# Patient Record
Sex: Female | Born: 1970 | Race: White | Hispanic: No | Marital: Married | Smoking: Never smoker
Health system: Southern US, Community
[De-identification: ages and names within clinical notes are randomized; demographics above are authoritative.]

## PROBLEM LIST (undated history)

## (undated) ENCOUNTER — Ambulatory Visit: Admission: EM | Payer: BC Managed Care – PPO | Source: Home / Self Care

## (undated) DIAGNOSIS — L598 Other specified disorders of the skin and subcutaneous tissue related to radiation: Secondary | ICD-10-CM

## (undated) DIAGNOSIS — R0609 Other forms of dyspnea: Secondary | ICD-10-CM

## (undated) DIAGNOSIS — M2550 Pain in unspecified joint: Secondary | ICD-10-CM

## (undated) DIAGNOSIS — E559 Vitamin D deficiency, unspecified: Secondary | ICD-10-CM

## (undated) DIAGNOSIS — E349 Endocrine disorder, unspecified: Secondary | ICD-10-CM

## (undated) DIAGNOSIS — F329 Major depressive disorder, single episode, unspecified: Secondary | ICD-10-CM

## (undated) DIAGNOSIS — F32A Depression, unspecified: Secondary | ICD-10-CM

## (undated) DIAGNOSIS — C539 Malignant neoplasm of cervix uteri, unspecified: Secondary | ICD-10-CM

## (undated) DIAGNOSIS — E119 Type 2 diabetes mellitus without complications: Secondary | ICD-10-CM

## (undated) DIAGNOSIS — F419 Anxiety disorder, unspecified: Secondary | ICD-10-CM

## (undated) DIAGNOSIS — I10 Essential (primary) hypertension: Secondary | ICD-10-CM

## (undated) DIAGNOSIS — R768 Other specified abnormal immunological findings in serum: Secondary | ICD-10-CM

## (undated) DIAGNOSIS — E039 Hypothyroidism, unspecified: Secondary | ICD-10-CM

## (undated) DIAGNOSIS — N189 Chronic kidney disease, unspecified: Secondary | ICD-10-CM

## (undated) DIAGNOSIS — I82409 Acute embolism and thrombosis of unspecified deep veins of unspecified lower extremity: Secondary | ICD-10-CM

## (undated) HISTORY — PX: OTHER SURGICAL HISTORY: SHX169

## (undated) HISTORY — DX: Acute embolism and thrombosis of unspecified deep veins of unspecified lower extremity: I82.409

## (undated) HISTORY — DX: Anxiety disorder, unspecified: F41.9

## (undated) HISTORY — DX: Hypothyroidism, unspecified: E03.9

## (undated) HISTORY — DX: Malignant neoplasm of cervix uteri, unspecified: C53.9

## (undated) HISTORY — PX: ABDOMINAL HYSTERECTOMY: SHX81

## (undated) HISTORY — DX: Essential (primary) hypertension: I10

## (undated) HISTORY — DX: Major depressive disorder, single episode, unspecified: F32.9

## (undated) HISTORY — PX: MANDIBLE SURGERY: SHX707

## (undated) HISTORY — PX: CYSTOURETHROSCOPY: SHX476

## (undated) HISTORY — PX: WISDOM TOOTH EXTRACTION: SHX21

## (undated) HISTORY — DX: Depression, unspecified: F32.A

## (undated) HISTORY — PX: CYSTECTOMY, PARTIAL, ROBOT-ASSISTED, LAPAROSCOPIC: SHX7238

## (undated) HISTORY — PX: GLOSSECTOMY: SUR645

## (undated) HISTORY — DX: Endocrine disorder, unspecified: E34.9

---

## 2007-03-11 ENCOUNTER — Ambulatory Visit: Payer: Self-pay | Admitting: Specialist

## 2011-05-23 ENCOUNTER — Ambulatory Visit: Payer: Self-pay | Admitting: Nurse Practitioner

## 2012-06-18 ENCOUNTER — Ambulatory Visit: Payer: Self-pay | Admitting: Nurse Practitioner

## 2012-06-24 ENCOUNTER — Ambulatory Visit: Payer: Self-pay | Admitting: Nurse Practitioner

## 2012-12-22 ENCOUNTER — Ambulatory Visit: Payer: Self-pay

## 2015-01-11 ENCOUNTER — Other Ambulatory Visit: Payer: Self-pay | Admitting: Internal Medicine

## 2015-03-13 ENCOUNTER — Other Ambulatory Visit: Payer: Self-pay | Admitting: Internal Medicine

## 2015-03-20 ENCOUNTER — Other Ambulatory Visit: Payer: Self-pay | Admitting: Internal Medicine

## 2016-02-20 ENCOUNTER — Other Ambulatory Visit: Payer: Self-pay | Admitting: Nurse Practitioner

## 2016-03-01 ENCOUNTER — Other Ambulatory Visit: Payer: Self-pay | Admitting: Nurse Practitioner

## 2016-03-01 DIAGNOSIS — Z1231 Encounter for screening mammogram for malignant neoplasm of breast: Secondary | ICD-10-CM

## 2016-03-07 ENCOUNTER — Ambulatory Visit: Admission: RE | Admit: 2016-03-07 | Payer: Self-pay | Source: Ambulatory Visit

## 2017-02-10 ENCOUNTER — Other Ambulatory Visit: Payer: Self-pay

## 2017-02-17 ENCOUNTER — Ambulatory Visit: Payer: BC Managed Care – PPO | Admitting: Urology

## 2017-02-17 ENCOUNTER — Encounter: Payer: Self-pay | Admitting: Urology

## 2017-02-17 VITALS — BP 113/76 | HR 99 | Ht 62.0 in | Wt 184.3 lb

## 2017-02-17 DIAGNOSIS — N390 Urinary tract infection, site not specified: Secondary | ICD-10-CM

## 2017-02-17 DIAGNOSIS — N1339 Other hydronephrosis: Secondary | ICD-10-CM

## 2017-02-17 LAB — MICROSCOPIC EXAMINATION

## 2017-02-17 LAB — URINALYSIS, COMPLETE
Bilirubin, UA: NEGATIVE
Glucose, UA: NEGATIVE
LEUKOCYTES UA: NEGATIVE
Nitrite, UA: NEGATIVE
PH UA: 5.5 (ref 5.0–7.5)
PROTEIN UA: NEGATIVE
RBC, UA: NEGATIVE
Specific Gravity, UA: >1.03 — ABNORMAL HIGH (ref 1.005–1.030)
Urobilinogen, Ur: 0.2 mg/dL (ref 0.2–1.0)

## 2017-02-17 NOTE — Progress Notes (Unsigned)
02/17/2017 3:31 PM   Cynthia Mccarthy Cynthia Mccarthy [birth year] [id number]  Referring provider: Sallee Lange, NP [address] [place], [place] [address]  Chief Complaint  Patient presents with  . New Patient (Initial Visit)     HPI: I was consulted to assess the patient is neurogenic bladder diagnosed in [place] a few years ago following a radical hysterectomy for cancer and radiation. She can void a small amount but catheterizes 3 times a day. She leaks with coughing sneezing but not with bending or lifting. Sometimes she can never urge incontinence. She does leak of varying amounts at night while she is asleep. She has a low-grade discomfort or burning in the vaginal area that was nonspecific. She otherwise is not lost sensation. She generally wears 1 and sometimes 2 pads a day sometimes damp sometimes more moderate wet  Her primary complaint is for 5 bladder infections a year sometimes needing more than one course of an antibiotic to clear. She will get bilateral low back pain and cloudy urine and nausea  She otherwise has no neurologic issues. Her bowel movements tend to be loose or  Modifying factors: There are no other modifying factors  Associated signs and symptoms: There are no other associated signs and symptoms Aggravating and relieving factors: There are no other aggravating or relieving factors Severity: Moderate Duration: Persistent   PMH: Past Medical History:  Diagnosis Date  . Anxiety   . Cervical cancer ([place])   . Depression   . DVT (deep venous thrombosis) ([place])   . Hormone disorder   . Hypertension   . Hypothyroidism     Surgical History: Past Surgical History:  Procedure Laterality Date  . ABDOMINAL HYSTERECTOMY      Home Medications:  Allergies as of 02/17/2017      Reactions   Penicillins Rash   Sulfa Antibiotics Hives, Rash      Medication List       Accurate as of 02/17/17  3:31 PM. Always use your most recent med list.          ALOE 19379  & PROBIOTICS PO Take by mouth.   busPIRone 15 MG tablet Commonly known as:  BUSPAR   cetirizine 10 MG tablet Commonly known as:  ZYRTEC Take by mouth.   Cranberry 1000 MG Caps Take by mouth.   enalapril 5 MG tablet Commonly known as:  VASOTEC   fluticasone 50 MCG/ACT nasal spray Commonly known as:  FLONASE Place into the nose.   INTERMITTENT 14FR/40CM Misc Use for urinary self-catheterization 3 times a day as directed   levothyroxine 88 MCG tablet Commonly known as:  SYNTHROID, LEVOTHROID   sertraline 100 MG tablet Commonly known as:  ZOLOFT Take by mouth.   traZODone 50 MG tablet Commonly known as:  DESYREL Take 1-2 tablets by mouth at bedtime.   venlafaxine XR 75 MG 24 hr capsule Commonly known as:  EFFEXOR-XR TAKE 3 CAPSULES BY MOUTH ONCE DAILY   Vitamin D3 5000 units Tabs Take by mouth.       Allergies:  Allergies  Allergen Reactions  . Penicillins Rash  . Sulfa Antibiotics Hives and Rash    Family History: Family History  Problem Relation Age of Onset  . Bladder Cancer Neg Hx   . Kidney cancer Neg Hx     Social History:  reports that she has never smoked. She has never used smokeless tobacco. She reports that she drinks alcohol. She reports that she does not use drugs.  ROS:  UROLOGY Frequent Urination?: Yes Hard to postpone urination?: Yes Burning/pain with urination?: Yes Get up at night to urinate?: Yes Leakage of urine?: Yes Urine stream starts and stops?: No Trouble starting stream?: No Do you have to strain to urinate?: Yes Blood in urine?: No Urinary tract infection?: Yes Sexually transmitted disease?: No Injury to kidneys or bladder?: Yes Painful intercourse?: No Weak stream?: Yes Currently pregnant?: No Vaginal bleeding?: No Last menstrual period?: n  Gastrointestinal Nausea?: Yes Vomiting?: No Indigestion/heartburn?: No Diarrhea?: Yes Constipation?: Yes  Constitutional Fever: Yes Night sweats?: Yes Weight loss?:  No Fatigue?: Yes  Skin Skin rash/lesions?: No Itching?: No  Eyes Blurred vision?: Yes Double vision?: Yes  Ears/Nose/Throat Sore throat?: No Sinus problems?: Yes  Hematologic/Lymphatic Swollen glands?: No Easy bruising?: No  Cardiovascular Leg swelling?: No Chest pain?: No  Respiratory Cough?: No Shortness of breath?: No  Endocrine Excessive thirst?: Yes  Musculoskeletal Back pain?: Yes Joint pain?: Yes  Neurological Headaches?: No Dizziness?: No  Psychologic Depression?: Yes Anxiety?: Yes  Physical Exam: BP 113/76 (BP Location: Right Arm, Patient Position: Sitting, Cuff Size: Normal)   Pulse 99   Ht 5\' 2"  (1.575 m)   Wt 184 lb 4.8 oz (83.6 kg)   BMI 33.71 kg/m   Constitutional:  Alert and oriented, No acute distress. HEENT: [place] AT, moist mucus membranes.  Trachea midline, no masses. Cardiovascular: No clubbing, cyanosis, or edema. Respiratory: Normal respiratory effort, no increased work of breathing. 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 Her bladder neck did not descend. She had a negative cough test and no prolapse Skin: No rashes, bruises or suspicious lesions. Lymph: No cervical or inguinal adenopathy. Neurologic: Grossly intact, no focal deficits, moving all 4 extremities. Psychiatric: Normal mood and affect.  Laboratory Data:  Urinalysis No results found for: COLORURINE, APPEARANCEUR, LABSPEC, PHURINE, GLUCOSEU, HGBUR, BILIRUBINUR, KETONESUR, PROTEINUR, UROBILINOGEN, NITRITE, LEUKOCYTESUR  Pertinent Imaging: none  Assessment & Plan:  The patient does self-catheterization for a presumed neurogenic bladder. She does have milder incontinence that is mixed. She can have bedwetting. She is getting bladder infections. Pathophysiology and workup discussed. Prevention of infections might improve her  incontinence  I will have the patient come back with a renal ultrasound. I then likely we'll start her on prophylaxis. Her incontinence is minimal and I will ask her her goals regarding.  There are no diagnoses linked to this encounter.  No Follow-up on file.  Reece Packer, MD  [facility] [address], [place], [place] [address] [phone]

## 2017-02-21 ENCOUNTER — Ambulatory Visit (INDEPENDENT_AMBULATORY_CARE_PROVIDER_SITE_OTHER): Payer: BC Managed Care – PPO

## 2017-02-21 ENCOUNTER — Telehealth: Payer: Self-pay

## 2017-02-21 DIAGNOSIS — R300 Dysuria: Secondary | ICD-10-CM

## 2017-02-21 LAB — URINALYSIS, COMPLETE
BILIRUBIN UA: NEGATIVE
Glucose, UA: NEGATIVE
NITRITE UA: NEGATIVE
PH UA: 5.5 (ref 5.0–7.5)
Specific Gravity, UA: 1.02 (ref 1.005–1.030)
UUROB: 0.2 mg/dL (ref 0.2–1.0)

## 2017-02-21 LAB — MICROSCOPIC EXAMINATION: Epithelial Cells (non renal): NONE SEEN /hpf (ref 0–10)

## 2017-02-21 NOTE — Progress Notes (Signed)
Pt presents today with c/o urinary frequency and urgency, dysuria, leakage of urine, and nausea. Pt performed CIC to give specimen. Urine was sent for u/a and cx.  Due to pt extensive +ucx and abx hx Dr. Pilar Jarvis was not comfortable giving pt more abx until ucx results are back. Made pt aware. Pt voiced understanding.  Blood pressure 110/76, pulse 89, height 5\' 3"  (1.6 m), weight 184 lb (83.5 kg).

## 2017-02-21 NOTE — Telephone Encounter (Signed)
Pt called stating she was a new pt on Monday to see Dr. Matilde Sprang and she was under the impression her urine was going to be sent for cx. Made pt aware urine did not get sent. Pt stated that she is having severe burning, when she urinates and does CIC, and back pain. Made pt aware can come and give another urine on nurse schedule. Pt voiced understanding. Pt was added to nurse schedule for this afternoon.

## 2017-02-26 LAB — CULTURE, URINE COMPREHENSIVE

## 2017-02-28 ENCOUNTER — Telehealth: Payer: Self-pay

## 2017-02-28 NOTE — Telephone Encounter (Signed)
Nickie Retort, MD  Lestine Box, LPN        Please let patient know culture was negative. No abx needed. thanks    Spoke with pt and made aware ucx was negative. Pt voiced understanding.

## 2017-03-06 ENCOUNTER — Ambulatory Visit
Admission: RE | Admit: 2017-03-06 | Discharge: 2017-03-06 | Disposition: A | Discharge status: Home / Self Care | Payer: BC Managed Care – PPO | Source: Ambulatory Visit | Attending: Urology | Admitting: Urology

## 2017-03-06 DIAGNOSIS — N1339 Other hydronephrosis: Secondary | ICD-10-CM | POA: Diagnosis present

## 2017-03-06 DIAGNOSIS — N390 Urinary tract infection, site not specified: Secondary | ICD-10-CM | POA: Insufficient documentation

## 2017-03-07 ENCOUNTER — Telehealth: Payer: Self-pay

## 2017-03-07 ENCOUNTER — Ambulatory Visit: Payer: BC Managed Care – PPO

## 2017-03-07 NOTE — Telephone Encounter (Signed)
Pt requesting to have lab work done before [place] on 03/12/17 w/ Dr. Matilde Sprang. Advised pt OV is for  Renal U/S f/u, discuss if labs needed w/ provider at time of visit. Pt verbalized understanding.

## 2017-03-12 ENCOUNTER — Ambulatory Visit (INDEPENDENT_AMBULATORY_CARE_PROVIDER_SITE_OTHER): Payer: BC Managed Care – PPO | Admitting: Urology

## 2017-03-12 ENCOUNTER — Encounter: Payer: Self-pay | Admitting: Urology

## 2017-03-12 VITALS — BP 110/73 | HR 87 | Ht 63.0 in | Wt 178.2 lb

## 2017-03-12 DIAGNOSIS — R339 Retention of urine, unspecified: Secondary | ICD-10-CM

## 2017-03-12 DIAGNOSIS — R300 Dysuria: Secondary | ICD-10-CM

## 2017-03-12 DIAGNOSIS — N3946 Mixed incontinence: Secondary | ICD-10-CM | POA: Diagnosis not present

## 2017-03-12 MED ORDER — TRIMETHOPRIM 100 MG PO TABS: 100.0000 mg | ORAL_TABLET | Freq: Every day | ORAL | 11 refills | Status: DC

## 2017-03-12 NOTE — Progress Notes (Signed)
03/12/2017 3:24 PM   Cynthia Mccarthy 1970-08-04 [id number]  Referring provider: Sallee Lange, NP [address] [place], [place] [address]  Chief Complaint  Patient presents with  . Follow-up    RUS results    HPI: I was consulted to assess the patient is neurogenic bladder diagnosed in [place] a few years ago following a radical hysterectomy for cancer and radiation. She can void a small amount but catheterizes 3 times a day. She leaks with coughing sneezing but not with bending or lifting. Sometimes she can never urge incontinence. She does leak of varying amounts at night while she is asleep. She has a low-grade discomfort or burning in the vaginal area that was nonspecific. She otherwise is not lost sensation. She generally wears 1 and sometimes 2 pads a day sometimes damp sometimes more moderate wet  The patient does self-catheterization for a presumed neurogenic bladder. She does have milder incontinence that is mixed. She can have bedwetting. She is getting bladder infections. Pathophysiology and workup discussed. Prevention of infections might improve her incontinence  I will have the patient come back with a renal ultrasound. I then likely we'll start her on prophylaxis. Her incontinence is minimal and I will ask her her goals regarding.  Today U/sound normal Incomplete bladder emptying stable.  Clinically not infected   PMH: Past Medical History:  Diagnosis Date  . Anxiety   . Cervical cancer ([place])   . Depression   . DVT (deep venous thrombosis) ([place])   . Hormone disorder   . Hypertension   . Hypothyroidism     Surgical History: Past Surgical History:  Procedure Laterality Date  . ABDOMINAL HYSTERECTOMY      Home Medications:  Allergies as of 03/12/2017      Reactions   Penicillins Rash   Sulfa Antibiotics Hives, Rash      Medication List       Accurate as of 03/12/17  3:24 PM. Always use your most recent med list.          ALOE 83382 &  PROBIOTICS PO Take by mouth.   busPIRone 15 MG tablet Commonly known as:  BUSPAR   cetirizine 10 MG tablet Commonly known as:  ZYRTEC Take by mouth.   Cranberry 1000 MG Caps Take by mouth.   enalapril 5 MG tablet Commonly known as:  VASOTEC   fluticasone 50 MCG/ACT nasal spray Commonly known as:  FLONASE Place into the nose.   INTERMITTENT 14FR/40CM Misc Use for urinary self-catheterization 3 times a day as directed   levothyroxine 88 MCG tablet Commonly known as:  SYNTHROID, LEVOTHROID   sertraline 100 MG tablet Commonly known as:  ZOLOFT Take by mouth.   traZODone 50 MG tablet Commonly known as:  DESYREL Take 1-2 tablets by mouth at bedtime.   venlafaxine XR 75 MG 24 hr capsule Commonly known as:  EFFEXOR-XR TAKE 3 CAPSULES BY MOUTH ONCE DAILY   Vitamin D3 5000 units Tabs Take by mouth.       Allergies:  Allergies  Allergen Reactions  . Penicillins Rash  . Sulfa Antibiotics Hives and Rash    Family History: Family History  Problem Relation Age of Onset  . Bladder Cancer Neg Hx   . Kidney cancer Neg Hx     Social History:  reports that she has never smoked. She has never used smokeless tobacco. She reports that she drinks alcohol. She reports that she does not use drugs.  ROS:  Physical Exam: There were no vitals taken for this visit.  Constitutional:  Alert and oriented, No acute distress. HEENT: [place] AT, moist mucus membranes.  Trachea midline, no masses.  Laboratory Data:  Urinalysis    Component Value Date/Time   APPEARANCEUR Cloudy (A) 02/21/2017 1503   GLUCOSEU Negative 02/21/2017 1503   BILIRUBINUR Negative 02/21/2017 1503   PROTEINUR 2+ (A) 02/21/2017 1503   NITRITE Negative 02/21/2017 1503   LEUKOCYTESUR 1+ (A) 02/21/2017 1503    Pertinent Imaging: As above  Assessment & Plan: Patient will be reassessed in 2 months on daily trimethoprim.  Sulfa allergy  discussed  There are no diagnoses linked to this encounter.  No Follow-up on file.  Reece Packer, MD  [facility] [address], [place], [place] [address] [phone]

## 2017-03-13 LAB — URINALYSIS, COMPLETE
BILIRUBIN UA: NEGATIVE
Glucose, UA: NEGATIVE
Nitrite, UA: POSITIVE — AB
PH UA: 5.5 (ref 5.0–7.5)
PROTEIN UA: NEGATIVE
Specific Gravity, UA: 1.025 (ref 1.005–1.030)
Urobilinogen, Ur: 0.2 mg/dL (ref 0.2–1.0)

## 2017-03-13 LAB — MICROSCOPIC EXAMINATION
Epithelial Cells (non renal): NONE SEEN /hpf (ref 0–10)
RBC, UA: NONE SEEN /hpf (ref 0–?)

## 2017-03-24 ENCOUNTER — Ambulatory Visit: Payer: BC Managed Care – PPO

## 2017-04-21 ENCOUNTER — Encounter: Payer: Self-pay | Admitting: Urology

## 2017-04-21 ENCOUNTER — Ambulatory Visit: Payer: BC Managed Care – PPO | Admitting: Urology

## 2017-04-21 VITALS — BP 113/77 | HR 97 | Ht 61.0 in | Wt 175.0 lb

## 2017-04-21 DIAGNOSIS — R339 Retention of urine, unspecified: Secondary | ICD-10-CM

## 2017-04-21 MED ORDER — MIRABEGRON ER 50 MG PO TB24: 50.0000 mg | ORAL_TABLET | Freq: Every day | ORAL | 11 refills | Status: DC

## 2017-04-21 NOTE — Progress Notes (Signed)
04/21/2017 2:45 PM   Cynthia Mccarthy Cynthia Mccarthy [birth year] [id number]  Referring provider: Sallee Lange, NP [address] [place], [place] [address]  Chief Complaint  Patient presents with  . Dysuria    8wk    HPI: I was consulted to assess the patient is neurogenic bladder diagnosed in [place] a few years ago following a radical hysterectomy for cancer and radiation. She can void a small amount but catheterizes 3 times a day. She leaks with coughing sneezing but not with bending or lifting. Sometimes she can never urge incontinence. She does leak of varying amounts at night while she is asleep. She has a low-grade discomfort or burning in the vaginal area that was nonspecific. She otherwise is not lost sensation. She generally wears 1 and sometimes 2 pads a day sometimes damp sometimes more moderate wet  The patient does self-catheterization for a presumed neurogenic bladder. She does have milder incontinence that is mixed. She can have bedwetting. She is getting bladder infections. Prevention of infections might improve her incontinence  I will have the patient come back with a renal ultrasound. I then likely we'll start her on prophylaxis. Her incontinence is minimal and I will ask her her goals regarding.  U/sound normal  Today Clinically infection free but no change in incontinence    PMH: Past Medical History:  Diagnosis Date  . Anxiety   . Cervical cancer ([place])   . Depression   . DVT (deep venous thrombosis) ([place])   . Hormone disorder   . Hypertension   . Hypothyroidism     Surgical History: Past Surgical History:  Procedure Laterality Date  . ABDOMINAL HYSTERECTOMY      Home Medications:  Allergies as of 04/21/2017      Reactions   Penicillins Rash   Sulfa Antibiotics Hives, Rash      Medication List        Accurate as of 04/21/17  2:45 PM. Always use your most recent med list.          ALOE 45809 & PROBIOTICS PO Take by mouth.   busPIRone 15  MG tablet Commonly known as:  BUSPAR   cetirizine 10 MG tablet Commonly known as:  ZYRTEC Take by mouth.   Cranberry 1000 MG Caps Take by mouth.   enalapril 5 MG tablet Commonly known as:  VASOTEC   fluticasone 50 MCG/ACT nasal spray Commonly known as:  FLONASE Place into the nose.   INTERMITTENT 14FR/40CM Misc Use for urinary self-catheterization 3 times a day as directed   levothyroxine 88 MCG tablet Commonly known as:  SYNTHROID, LEVOTHROID   mirabegron ER 50 MG Tb24 tablet Commonly known as:  MYRBETRIQ Take 1 tablet (50 mg total) by mouth daily.   sertraline 100 MG tablet Commonly known as:  ZOLOFT Take by mouth.   traZODone 50 MG tablet Commonly known as:  DESYREL Take 1-2 tablets by mouth at bedtime.   trimethoprim 100 MG tablet Commonly known as:  TRIMPEX Take 1 tablet (100 mg total) by mouth daily.   venlafaxine XR 75 MG 24 hr capsule Commonly known as:  EFFEXOR-XR TAKE 3 CAPSULES BY MOUTH ONCE DAILY   Vitamin D3 5000 units Tabs Take by mouth.       Allergies:  Allergies  Allergen Reactions  . Penicillins Rash  . Sulfa Antibiotics Hives and Rash    Family History: Family History  Problem Relation Age of Onset  . Bladder Cancer Neg Hx   . Kidney cancer Neg  Hx     Social History:  reports that  has never smoked. she has never used smokeless tobacco. She reports that she drinks alcohol. She reports that she does not use drugs.  ROS: UROLOGY Frequent Urination?: No Hard to postpone urination?: No Burning/pain with urination?: No Get up at night to urinate?: No Leakage of urine?: Yes Urine stream starts and stops?: No Trouble starting stream?: No Do you have to strain to urinate?: Yes Blood in urine?: No Urinary tract infection?: No Sexually transmitted disease?: No Injury to kidneys or bladder?: No Painful intercourse?: No Weak stream?: No Currently pregnant?: No Vaginal bleeding?: No Last menstrual period?:  n  Gastrointestinal Nausea?: No Vomiting?: No Indigestion/heartburn?: No Diarrhea?: No Constipation?: No  Constitutional Fever: No Night sweats?: No Weight loss?: No Fatigue?: Yes  Skin Skin rash/lesions?: No Itching?: No  Eyes Blurred vision?: No Double vision?: No  Ears/Nose/Throat Sore throat?: No Sinus problems?: No  Hematologic/Lymphatic Swollen glands?: No Easy bruising?: No  Cardiovascular Leg swelling?: No Chest pain?: No  Respiratory Cough?: No Shortness of breath?: No  Endocrine Excessive thirst?: Yes  Musculoskeletal Back pain?: No Joint pain?: No  Neurological Headaches?: No Dizziness?: No  Psychologic Depression?: No Anxiety?: No  Physical Exam: BP 113/77   Pulse 97   Ht 5\' 1"  (1.549 m)   Wt 175 lb (79.4 kg)   BMI 33.07 kg/m   Constitutional:  Alert and oriented, No acute distress.   Laboratory Data: No results found for: WBC, HGB, HCT, MCV, PLT  No results found for: CREATININE  No results found for: PSA  No results found for: TESTOSTERONE  No results found for: HGBA1C  Urinalysis    Component Value Date/Time   APPEARANCEUR Cloudy (A) 03/12/2017 1528   GLUCOSEU Negative 03/12/2017 1528   BILIRUBINUR Negative 03/12/2017 1528   PROTEINUR Negative 03/12/2017 1528   NITRITE Positive (A) 03/12/2017 1528   LEUKOCYTESUR 2+ (A) 03/12/2017 1528    Pertinent Imaging: None  Assessment & Plan: The patient understands the pathophysiology of her incontinence and the role of urodynamics and cystoscopy in evaluating complicated incontinence post radiation and surgery and retention.  I did discuss InterStim briefly and her treatment goals.  At this stage she does not want a urodynamic study but we are going to try to get her as dry as possible while she is performing self-catheterization.  She does describe being told after a urodynamic study previously that her bladder does not squeeze well and she is having an element likely of  overflow incontinence.  I will reevaluate her in about 5 weeks on Myrbetriq samples and prescription 50 mg.  We will then try other overactive bladder medications.  We are not at this stage pursuing InterStim and she understands with some partial neurologic events it may be successful.  We will continue to review this  There are no diagnoses linked to this encounter.  No Follow-up on file.  Reece Packer, MD  [facility] [address] [place], [place] [address] [phone]

## 2017-04-28 ENCOUNTER — Ambulatory Visit: Payer: BC Managed Care – PPO

## 2017-06-01 ENCOUNTER — Encounter: Payer: Self-pay | Admitting: Gynecology

## 2017-06-01 ENCOUNTER — Ambulatory Visit (INDEPENDENT_AMBULATORY_CARE_PROVIDER_SITE_OTHER)
Admission: EM | Admit: 2017-06-01 | Discharge: 2017-06-01 | Disposition: A | Discharge status: Home / Self Care | Payer: BC Managed Care – PPO | Source: Home / Self Care | Attending: Family Medicine | Admitting: Family Medicine

## 2017-06-01 ENCOUNTER — Encounter: Payer: Self-pay | Admitting: Emergency Medicine

## 2017-06-01 ENCOUNTER — Other Ambulatory Visit: Payer: Self-pay

## 2017-06-01 ENCOUNTER — Inpatient Hospital Stay
Admission: EM | Admit: 2017-06-01 | Discharge: 2017-06-08 | DRG: 947 | Disposition: A | Discharge status: Other Acute Inpatient Hospital | Payer: BC Managed Care – PPO | Attending: Internal Medicine | Admitting: Internal Medicine

## 2017-06-01 ENCOUNTER — Emergency Department: Payer: BC Managed Care – PPO

## 2017-06-01 DIAGNOSIS — N189 Chronic kidney disease, unspecified: Secondary | ICD-10-CM | POA: Diagnosis present

## 2017-06-01 DIAGNOSIS — Z9221 Personal history of antineoplastic chemotherapy: Secondary | ICD-10-CM

## 2017-06-01 DIAGNOSIS — I82442 Acute embolism and thrombosis of left tibial vein: Secondary | ICD-10-CM | POA: Diagnosis not present

## 2017-06-01 DIAGNOSIS — K8020 Calculus of gallbladder without cholecystitis without obstruction: Secondary | ICD-10-CM | POA: Diagnosis present

## 2017-06-01 DIAGNOSIS — E039 Hypothyroidism, unspecified: Secondary | ICD-10-CM | POA: Diagnosis present

## 2017-06-01 DIAGNOSIS — Z882 Allergy status to sulfonamides status: Secondary | ICD-10-CM

## 2017-06-01 DIAGNOSIS — R609 Edema, unspecified: Secondary | ICD-10-CM

## 2017-06-01 DIAGNOSIS — R1030 Lower abdominal pain, unspecified: Secondary | ICD-10-CM | POA: Diagnosis not present

## 2017-06-01 DIAGNOSIS — D72829 Elevated white blood cell count, unspecified: Secondary | ICD-10-CM | POA: Diagnosis present

## 2017-06-01 DIAGNOSIS — Z87440 Personal history of urinary (tract) infections: Secondary | ICD-10-CM

## 2017-06-01 DIAGNOSIS — Z79899 Other long term (current) drug therapy: Secondary | ICD-10-CM

## 2017-06-01 DIAGNOSIS — E559 Vitamin D deficiency, unspecified: Secondary | ICD-10-CM | POA: Diagnosis present

## 2017-06-01 DIAGNOSIS — I82432 Acute embolism and thrombosis of left popliteal vein: Secondary | ICD-10-CM | POA: Diagnosis not present

## 2017-06-01 DIAGNOSIS — Z90710 Acquired absence of both cervix and uterus: Secondary | ICD-10-CM

## 2017-06-01 DIAGNOSIS — R188 Other ascites: Secondary | ICD-10-CM | POA: Diagnosis present

## 2017-06-01 DIAGNOSIS — Z86718 Personal history of other venous thrombosis and embolism: Secondary | ICD-10-CM

## 2017-06-01 DIAGNOSIS — I129 Hypertensive chronic kidney disease with stage 1 through stage 4 chronic kidney disease, or unspecified chronic kidney disease: Secondary | ICD-10-CM | POA: Diagnosis present

## 2017-06-01 DIAGNOSIS — F419 Anxiety disorder, unspecified: Secondary | ICD-10-CM | POA: Diagnosis present

## 2017-06-01 DIAGNOSIS — Z923 Personal history of irradiation: Secondary | ICD-10-CM | POA: Diagnosis not present

## 2017-06-01 DIAGNOSIS — N319 Neuromuscular dysfunction of bladder, unspecified: Secondary | ICD-10-CM | POA: Diagnosis present

## 2017-06-01 DIAGNOSIS — Z79890 Hormone replacement therapy (postmenopausal): Secondary | ICD-10-CM | POA: Diagnosis not present

## 2017-06-01 DIAGNOSIS — K5900 Constipation, unspecified: Secondary | ICD-10-CM | POA: Diagnosis present

## 2017-06-01 DIAGNOSIS — Z8541 Personal history of malignant neoplasm of cervix uteri: Secondary | ICD-10-CM

## 2017-06-01 DIAGNOSIS — R630 Anorexia: Secondary | ICD-10-CM | POA: Diagnosis present

## 2017-06-01 DIAGNOSIS — R102 Pelvic and perineal pain: Secondary | ICD-10-CM | POA: Diagnosis not present

## 2017-06-01 DIAGNOSIS — N12 Tubulo-interstitial nephritis, not specified as acute or chronic: Secondary | ICD-10-CM | POA: Diagnosis present

## 2017-06-01 DIAGNOSIS — N170 Acute kidney failure with tubular necrosis: Secondary | ICD-10-CM | POA: Diagnosis present

## 2017-06-01 DIAGNOSIS — Z880 Allergy status to penicillin: Secondary | ICD-10-CM

## 2017-06-01 DIAGNOSIS — F329 Major depressive disorder, single episode, unspecified: Secondary | ICD-10-CM | POA: Diagnosis present

## 2017-06-01 DIAGNOSIS — R109 Unspecified abdominal pain: Secondary | ICD-10-CM

## 2017-06-01 DIAGNOSIS — R110 Nausea: Secondary | ICD-10-CM | POA: Diagnosis not present

## 2017-06-01 DIAGNOSIS — N179 Acute kidney failure, unspecified: Secondary | ICD-10-CM

## 2017-06-01 DIAGNOSIS — I82402 Acute embolism and thrombosis of unspecified deep veins of left lower extremity: Secondary | ICD-10-CM | POA: Diagnosis not present

## 2017-06-01 DIAGNOSIS — R319 Hematuria, unspecified: Secondary | ICD-10-CM | POA: Diagnosis present

## 2017-06-01 LAB — COMPREHENSIVE METABOLIC PANEL
ALK PHOS: 44 U/L (ref 38–126)
ALT: 18 U/L (ref 14–54)
ANION GAP: 10 (ref 5–15)
AST: 22 U/L (ref 15–41)
Albumin: 4.3 g/dL (ref 3.5–5.0)
BILIRUBIN TOTAL: 0.5 mg/dL (ref 0.3–1.2)
BUN: 36 mg/dL — ABNORMAL HIGH (ref 6–20)
CALCIUM: 9.7 mg/dL (ref 8.9–10.3)
CO2: 22 mmol/L (ref 22–32)
Chloride: 107 mmol/L (ref 101–111)
Creatinine, Ser: 1.63 mg/dL — ABNORMAL HIGH (ref 0.44–1.00)
GFR calc non Af Amer: 36 mL/min — ABNORMAL LOW (ref >60)
GFR, EST AFRICAN AMERICAN: 42 mL/min — AB (ref >60)
Glucose, Bld: 118 mg/dL — ABNORMAL HIGH (ref 65–99)
Potassium: 4.4 mmol/L (ref 3.5–5.1)
SODIUM: 139 mmol/L (ref 135–145)
TOTAL PROTEIN: 7.6 g/dL (ref 6.5–8.1)

## 2017-06-01 LAB — CBC
HCT: 45.5 % (ref 35.0–47.0)
HEMOGLOBIN: 14.8 g/dL (ref 12.0–16.0)
MCH: 28.5 pg (ref 26.0–34.0)
MCHC: 32.6 g/dL (ref 32.0–36.0)
MCV: 87.4 fL (ref 80.0–100.0)
Platelets: 412 10*3/uL (ref 150–440)
RBC: 5.21 MIL/uL — AB (ref 3.80–5.20)
RDW: 14 % (ref 11.5–14.5)
WBC: 18.8 10*3/uL — ABNORMAL HIGH (ref 3.6–11.0)

## 2017-06-01 LAB — URINALYSIS, COMPLETE (UACMP) WITH MICROSCOPIC
BILIRUBIN URINE: NEGATIVE
BILIRUBIN URINE: NEGATIVE
Bacteria, UA: NONE SEEN
GLUCOSE, UA: NEGATIVE mg/dL
Glucose, UA: NEGATIVE mg/dL
KETONES UR: NEGATIVE mg/dL
KETONES UR: NEGATIVE mg/dL
LEUKOCYTES UA: NEGATIVE
NITRITE: NEGATIVE
Nitrite: NEGATIVE
PH: 5 (ref 5.0–8.0)
PROTEIN: 100 mg/dL — AB
Protein, ur: NEGATIVE mg/dL
Specific Gravity, Urine: >1.03 — ABNORMAL HIGH (ref 1.005–1.030)
Specific Gravity, Urine: 1.019 (ref 1.005–1.030)
pH: 5.5 (ref 5.0–8.0)

## 2017-06-01 LAB — LIPASE, BLOOD: Lipase: 33 U/L (ref 11–51)

## 2017-06-01 MED ORDER — LORATADINE 10 MG PO TABS
10.0000 mg | ORAL_TABLET | Freq: Every day | ORAL | Status: DC
  Administered 2017-06-01 – 2017-06-05 (×6): 10 mg via ORAL
  Filled 2017-06-01 (×6): qty 1

## 2017-06-01 MED ORDER — ONDANSETRON HCL 4 MG PO TABS
4.0000 mg | ORAL_TABLET | Freq: Four times a day (QID) | ORAL | Status: DC | PRN
  Administered 2017-06-05 (×3): 4 mg via ORAL
  Filled 2017-06-01 (×4): qty 1

## 2017-06-01 MED ORDER — SODIUM CHLORIDE 0.9% FLUSH
3.0000 mL | Freq: Two times a day (BID) | INTRAVENOUS | Status: DC
  Administered 2017-06-01 – 2017-06-07 (×11): 3 mL via INTRAVENOUS

## 2017-06-01 MED ORDER — TRAZODONE HCL 50 MG PO TABS
50.0000 mg | ORAL_TABLET | Freq: Every day | ORAL | Status: DC
  Administered 2017-06-01 – 2017-06-05 (×5): 100 mg via ORAL
  Filled 2017-06-01 (×6): qty 2

## 2017-06-01 MED ORDER — HYDROMORPHONE HCL 1 MG/ML IJ SOLN
0.2500 mg | Freq: Four times a day (QID) | INTRAMUSCULAR | Status: DC | PRN
  Administered 2017-06-01 – 2017-06-03 (×5): 0.25 mg via INTRAVENOUS
  Filled 2017-06-01 (×5): qty 1

## 2017-06-01 MED ORDER — SERTRALINE HCL 50 MG PO TABS
100.0000 mg | ORAL_TABLET | Freq: Every day | ORAL | Status: DC
  Administered 2017-06-01 – 2017-06-05 (×6): 100 mg via ORAL
  Filled 2017-06-01 (×6): qty 2

## 2017-06-01 MED ORDER — DEXTROSE 5 % IV SOLN
1.0000 g | Freq: Once | INTRAVENOUS | Status: AC
  Administered 2017-06-01: 1 g via INTRAVENOUS
  Filled 2017-06-01: qty 10

## 2017-06-01 MED ORDER — SODIUM CHLORIDE 0.9% FLUSH: 3.0000 mL | INTRAVENOUS | Status: DC | PRN

## 2017-06-01 MED ORDER — HEPARIN SODIUM (PORCINE) 5000 UNIT/ML IJ SOLN
5000.0000 [IU] | Freq: Three times a day (TID) | INTRAMUSCULAR | Status: DC
  Administered 2017-06-02 – 2017-06-03 (×2): 5000 [IU] via SUBCUTANEOUS
  Filled 2017-06-01 (×2): qty 1

## 2017-06-01 MED ORDER — DEXTROSE 5 % IV SOLN
2.0000 g | INTRAVENOUS | Status: DC
  Administered 2017-06-02 – 2017-06-07 (×6): 2 g via INTRAVENOUS
  Filled 2017-06-01 (×7): qty 2

## 2017-06-01 MED ORDER — NITROFURANTOIN MONOHYD MACRO 100 MG PO CAPS: 100.0000 mg | ORAL_CAPSULE | Freq: Two times a day (BID) | ORAL | 0 refills | Status: DC

## 2017-06-01 MED ORDER — FLUTICASONE PROPIONATE 50 MCG/ACT NA SUSP
2.0000 | Freq: Every day | NASAL | Status: DC
  Administered 2017-06-01 – 2017-06-05 (×3): 2 via NASAL
  Filled 2017-06-01: qty 16

## 2017-06-01 MED ORDER — SODIUM CHLORIDE 0.9 % IV SOLN
INTRAVENOUS | Status: AC
  Administered 2017-06-01 – 2017-06-02 (×2): via INTRAVENOUS

## 2017-06-01 MED ORDER — ACETAMINOPHEN 325 MG PO TABS: 650.0000 mg | ORAL_TABLET | Freq: Four times a day (QID) | ORAL | Status: DC | PRN

## 2017-06-01 MED ORDER — ONDANSETRON HCL 4 MG/2ML IJ SOLN
4.0000 mg | Freq: Four times a day (QID) | INTRAMUSCULAR | Status: DC | PRN
  Administered 2017-06-02 – 2017-06-07 (×11): 4 mg via INTRAVENOUS
  Filled 2017-06-01 (×12): qty 2

## 2017-06-01 MED ORDER — LEVOTHYROXINE SODIUM 88 MCG PO TABS
88.0000 ug | ORAL_TABLET | Freq: Every day | ORAL | Status: DC
  Administered 2017-06-02 – 2017-06-07 (×5): 88 ug via ORAL
  Filled 2017-06-01 (×8): qty 1

## 2017-06-01 MED ORDER — METRONIDAZOLE IN NACL 5-0.79 MG/ML-% IV SOLN
500.0000 mg | Freq: Three times a day (TID) | INTRAVENOUS | Status: DC
  Administered 2017-06-01 – 2017-06-07 (×19): 500 mg via INTRAVENOUS
  Filled 2017-06-01 (×22): qty 100

## 2017-06-01 MED ORDER — MIRABEGRON ER 50 MG PO TB24
50.0000 mg | ORAL_TABLET | Freq: Every day | ORAL | Status: DC
  Administered 2017-06-01 – 2017-06-05 (×5): 50 mg via ORAL
  Filled 2017-06-01 (×8): qty 1

## 2017-06-01 MED ORDER — ACETAMINOPHEN 650 MG RE SUPP: 650.0000 mg | Freq: Four times a day (QID) | RECTAL | Status: DC | PRN

## 2017-06-01 MED ORDER — BISACODYL 5 MG PO TBEC: 5.0000 mg | DELAYED_RELEASE_TABLET | Freq: Every day | ORAL | Status: DC | PRN

## 2017-06-01 MED ORDER — METRONIDAZOLE IN NACL 5-0.79 MG/ML-% IV SOLN
500.0000 mg | Freq: Once | INTRAVENOUS | Status: AC
  Administered 2017-06-01: 500 mg via INTRAVENOUS
  Filled 2017-06-01: qty 100

## 2017-06-01 MED ORDER — SENNOSIDES-DOCUSATE SODIUM 8.6-50 MG PO TABS
1.0000 | ORAL_TABLET | Freq: Every evening | ORAL | Status: DC | PRN
Start: 2017-06-01 — End: 2017-06-08
  Administered 2017-06-01 – 2017-06-07 (×3): 1 via ORAL
  Filled 2017-06-01 (×3): qty 1

## 2017-06-01 MED ORDER — ONDANSETRON HCL 4 MG/2ML IJ SOLN
4.0000 mg | Freq: Once | INTRAMUSCULAR | Status: AC
  Administered 2017-06-01: 4 mg via INTRAVENOUS
  Filled 2017-06-01: qty 2

## 2017-06-01 MED ORDER — HYDROCODONE-ACETAMINOPHEN 5-325 MG PO TABS
1.0000 | ORAL_TABLET | ORAL | Status: DC | PRN
  Administered 2017-06-01 – 2017-06-03 (×9): 2 via ORAL
  Filled 2017-06-01 (×9): qty 2

## 2017-06-01 MED ORDER — ALOE 10000 & PROBIOTICS PO CAPS: ORAL_CAPSULE | Freq: Every day | ORAL | Status: DC

## 2017-06-01 MED ORDER — ACETAMINOPHEN 500 MG PO TABS
1000.0000 mg | ORAL_TABLET | Freq: Once | ORAL | Status: AC
  Administered 2017-06-01: 1000 mg via ORAL
  Filled 2017-06-01: qty 2

## 2017-06-01 MED ORDER — CEFTRIAXONE SODIUM IN DEXTROSE 20 MG/ML IV SOLN
1.0000 g | Freq: Once | INTRAVENOUS | Status: AC
  Administered 2017-06-01: 1 g via INTRAVENOUS
  Filled 2017-06-01: qty 50

## 2017-06-01 MED ORDER — SODIUM CHLORIDE 0.9 % IV SOLN: 250.0000 mL | INTRAVENOUS | Status: DC | PRN

## 2017-06-01 MED ORDER — BUSPIRONE HCL 5 MG PO TABS
15.0000 mg | ORAL_TABLET | Freq: Every day | ORAL | Status: DC
  Administered 2017-06-01 – 2017-06-05 (×5): 15 mg via ORAL
  Filled 2017-06-01 (×9): qty 3

## 2017-06-01 MED ORDER — ALBUTEROL SULFATE (2.5 MG/3ML) 0.083% IN NEBU: 2.5000 mg | INHALATION_SOLUTION | RESPIRATORY_TRACT | Status: DC | PRN

## 2017-06-01 MED ORDER — METRONIDAZOLE IN NACL 5-0.79 MG/ML-% IV SOLN: 500.0000 mg | Freq: Four times a day (QID) | INTRAVENOUS | Status: DC

## 2017-06-01 MED ORDER — SODIUM CHLORIDE 0.9 % IV BOLUS (SEPSIS)
1000.0000 mL | Freq: Once | INTRAVENOUS | Status: AC
  Administered 2017-06-01: 1000 mL via INTRAVENOUS

## 2017-06-01 MED ORDER — MORPHINE SULFATE (PF) 2 MG/ML IV SOLN: 2.0000 mg | Freq: Once | INTRAVENOUS | Status: DC

## 2017-06-01 NOTE — Consult Note (Signed)
Pharmacy Antibiotic Note  Cynthia Mccarthy is a 47 y.o. female admitted on 06/01/2017 withIntra-abdominal infection.  Pharmacy has been consulted for ceftriaxone dosing. Patient is also receiving metronidazole.   Plan: Start ceftriaxone 2g IV every 24 hours.     Temp (24hrs), Avg:98.3 F (36.8 C), Min:98.2 F (36.8 C), Max:98.4 F (36.9 C)  Recent Labs  Lab 06/01/17 1006  WBC 18.8*  CREATININE 1.63*    Estimated Creatinine Clearance: 41.2 mL/min (A) (by C-G formula based on SCr of 1.63 mg/dL (H)).    Allergies  Allergen Reactions  . Penicillins Rash    Has patient had a PCN reaction causing immediate rash, facial/tongue/throat swelling, SOB or lightheadedness with hypotension: Yes Has patient had a PCN reaction causing severe rash involving mucus membranes or skin necrosis: No Has patient had a PCN reaction that required hospitalization: No Has patient had a PCN reaction occurring within the last 10 years: Yes If all of the above answers are "NO", then may proceed with Cephalosporin use.  . Sulfa Antibiotics Hives and Rash    Antimicrobials this admission: 1/13 Metronidazole  >>  1/13 Ceftriaxone  >>   Dose adjustments this admission:  Microbiology results: 1/13 UCx: pending  Thank you for allowing pharmacy to be a part of this patient's care.  Pernell Dupre, PharmD, BCPS Clinical Pharmacist 06/01/2017 3:18 PM

## 2017-06-01 NOTE — Progress Notes (Signed)
PHARMACIST - PHYSICIAN ORDER COMMUNICATION  CONCERNING: P&T Medication Policy on Herbal Medications  DESCRIPTION:  This patient's order for:  Aloe 10000 and Probiotic has been noted.  This product(s) is classified as an "herbal" or natural product. Due to a lack of definitive safety studies or FDA approval, nonstandard manufacturing practices, plus the potential risk of unknown drug-drug interactions while on inpatient medications, the Pharmacy and Therapeutics Committee does not permit the use of "herbal" or natural products of this type within [facility].   ACTION TAKEN: The pharmacy department is unable to verify this order at this time. Please reevaluate patient's clinical condition at discharge and address if the herbal or natural product(s) should be resumed at that time.   Chinita Greenland PharmD Clinical Pharmacist 06/01/2017

## 2017-06-01 NOTE — H&P (Signed)
[facility] [place] [facility] NAME: Cynthia Mccarthy    MR#:  [id number]  DATE OF BIRTH:  22-Aug-1970  DATE OF ADMISSION:  06/01/2017  PRIMARY CARE PHYSICIAN: Dayton Martes Victoriano Lain, NP   REQUESTING/REFERRING PHYSICIAN: Rudene Re, MD  CHIEF COMPLAINT:   Chief Complaint  Patient presents with  . Abdominal Pain   Diffuse abdominal pain for 3 days. HISTORY OF PRESENT ILLNESS:  Cynthia Mccarthy  is a 47 y.o. female with a known history of cervical cancer, DVT, hypertension and hypothyroidism.  The patient has had diffuse abdominal pain for the past 3 days, still has which is in lower abdomen, burning, bloated, intermittent and 6 out of a 10.  She also complains of nausea, but no vomiting or diarrhea, no chills or fever, no dysuria or hematuria.  She was found hematuria as outpatient and sent to ED for further evaluation.  Her urinalysis is unremarkable.  But CAT scan show moderate to large ascites. Cholelithiasis. No CT evidence for acute cholecystitis.   PAST MEDICAL HISTORY:   Past Medical History:  Diagnosis Date  . Anxiety   . Cervical cancer ([place])   . Depression   . DVT (deep venous thrombosis) ([place])   . Hormone disorder   . Hypertension   . Hypothyroidism     PAST SURGICAL HISTORY:   Past Surgical History:  Procedure Laterality Date  . ABDOMINAL HYSTERECTOMY      SOCIAL HISTORY:   Social History   Tobacco Use  . Smoking status: Never Smoker  . Smokeless tobacco: Never Used  Substance Use Topics  . Alcohol use: Yes    FAMILY HISTORY:   Family History  Problem Relation Age of Onset  . Bladder Cancer Neg Hx   . Kidney cancer Neg Hx     DRUG ALLERGIES:   Allergies  Allergen Reactions  . Penicillins Rash    Has patient had a PCN reaction causing immediate rash, facial/tongue/throat swelling, SOB or lightheadedness with hypotension: Yes Has patient had a PCN reaction causing severe rash involving mucus membranes or  skin necrosis: No Has patient had a PCN reaction that required hospitalization: No Has patient had a PCN reaction occurring within the last 10 years: Yes If all of the above answers are "NO", then may proceed with Cephalosporin use.  . Sulfa Antibiotics Hives and Rash    REVIEW OF SYSTEMS:   Review of Systems  Constitutional: Negative for chills, fever and malaise/fatigue.  HENT: Negative for sore throat.   Eyes: Negative for blurred vision and double vision.  Respiratory: Negative for cough, hemoptysis, shortness of breath, wheezing and stridor.   Cardiovascular: Negative for chest pain, palpitations, orthopnea and leg swelling.  Gastrointestinal: Positive for abdominal pain and nausea. Negative for blood in stool, diarrhea, melena and vomiting.  Genitourinary: Negative for dysuria, flank pain and hematuria.  Musculoskeletal: Negative for back pain and joint pain.  Skin: Negative for rash.  Neurological: Negative for dizziness, sensory change, focal weakness, seizures, loss of consciousness, weakness and headaches.  Endo/Heme/Allergies: Negative for polydipsia.  Psychiatric/Behavioral: Negative for depression. The patient is not nervous/anxious.     MEDICATIONS AT HOME:   Prior to Admission medications   Medication Sig Start Date End Date Taking? Authorizing Provider  busPIRone (BUSPAR) 15 MG tablet Take 15 mg by mouth daily.  01/29/17  Yes [provider]  cetirizine (ZYRTEC) 10 MG tablet Take 10 mg by mouth daily.    Yes [provider]  Cholecalciferol (  VITAMIN D3) 5000 units TABS Take 1 tablet by mouth daily.    Yes [provider]  Cranberry 1000 MG CAPS Take 1 capsule by mouth daily.    Yes [provider]  enalapril (VASOTEC) 5 MG tablet Take 5 mg by mouth daily.  01/21/17  Yes [provider]  levothyroxine (SYNTHROID, LEVOTHROID) 88 MCG tablet Take 88 mcg by mouth daily before breakfast.  02/05/17  Yes [provider]    mirabegron ER (MYRBETRIQ) 50 MG TB24 tablet Take 1 tablet (50 mg total) by mouth daily. 04/21/17  Yes MacDiarmid, Nicki Reaper, MD  Probiotic Product (ALOE 78676 & PROBIOTICS PO) Take 1 capsule by mouth daily.    Yes [provider]  sertraline (ZOLOFT) 100 MG tablet Take 100 mg by mouth daily.    Yes [provider]  traZODone (DESYREL) 50 MG tablet Take 1-2 tablets by mouth at bedtime.   Yes [provider]  trimethoprim (TRIMPEX) 100 MG tablet Take 1 tablet (100 mg total) by mouth daily. 03/12/17  Yes MacDiarmid, Nicki Reaper, MD  Catheters (INTERMITTENT 14FR/40CM) MISC Use for urinary self-catheterization 3 times a day as directed 09/21/14   [provider]  fluticasone (FLONASE) 50 MCG/ACT nasal spray Place 2 sprays into both nostrils daily.     [provider]  nitrofurantoin, macrocrystal-monohydrate, (MACROBID) 100 MG capsule Take 1 capsule (100 mg total) by mouth 2 (two) times daily. Patient not taking: Reported on 06/01/2017 06/01/17   [name], DO      VITAL SIGNS:  Blood pressure 112/62, pulse 76, temperature 98.4 F (36.9 C), temperature source Oral, resp. rate 18, SpO2 98 %.  PHYSICAL EXAMINATION:  Physical Exam  GENERAL:  47 y.o.-year-old patient lying in the bed with no acute distress.  EYES: Pupils equal, round, reactive to light and accommodation. No scleral icterus. Extraocular muscles intact.  HEENT: Head atraumatic, normocephalic. Oropharynx and nasopharynx clear.  NECK:  Supple, no jugular venous distention. No thyroid enlargement, no tenderness.  LUNGS: Normal breath sounds bilaterally, no wheezing, rales,rhonchi or crepitation. No use of accessory muscles of respiration.  CARDIOVASCULAR: S1, S2 normal. No murmurs, rubs, or gallops.  ABDOMEN: Soft, diffuse tenderness, nondistended. Bowel sounds present.  Positive ascites sign.   EXTREMITIES: No pedal edema, cyanosis, or clubbing.  NEUROLOGIC: Cranial nerves II through XII are intact.  Muscle strength 5/5 in all extremities. Sensation intact. Gait not checked.  PSYCHIATRIC: The patient is alert and oriented x 3.  SKIN: No obvious rash, lesion, or ulcer.   LABORATORY PANEL:   CBC Recent Labs  Lab 06/01/17 1006  WBC 18.8*  HGB 14.8  HCT 45.5  PLT 412   ------------------------------------------------------------------------------------------------------------------  Chemistries  Recent Labs  Lab 06/01/17 1006  NA 139  K 4.4  CL 107  CO2 22  GLUCOSE 118*  BUN 36*  CREATININE 1.63*  CALCIUM 9.7  AST 22  ALT 18  ALKPHOS 44  BILITOT 0.5   ------------------------------------------------------------------------------------------------------------------  Cardiac Enzymes No results for input(s): TROPONINI in the last 168 hours. ------------------------------------------------------------------------------------------------------------------  RADIOLOGY:  Ct Abdomen Pelvis Wo Contrast  Result Date: 06/01/2017 CLINICAL DATA:  Bilateral lower abdominal pain EXAM: CT ABDOMEN AND PELVIS WITHOUT CONTRAST TECHNIQUE: Multidetector CT imaging of the abdomen and pelvis was performed following the standard protocol without IV contrast. COMPARISON:  Ultrasound 03/06/2017 FINDINGS: Lower chest: Lung bases are clear. No effusions. Heart is normal size. Hepatobiliary: 1.5 cm gallstone within the gallbladder. No focal hepatic abnormality. Pancreas: No focal abnormality or ductal dilatation. Spleen:  No focal abnormality.  Normal size. Adrenals/Urinary Tract: No adrenal abnormality. No focal renal abnormality. No stones or hydronephrosis. Urinary bladder is unremarkable. Stomach/Bowel: Apparent prior appendectomy. Stomach, large and small bowel grossly unremarkable. Vascular/Lymphatic: No evidence of aneurysm or adenopathy. Reproductive: Prior hysterectomy.  No adnexal masses. Other: Moderate to large volume ascites noted in the pelvis. Small amount adjacent to the liver margin. No  free air. Musculoskeletal: No acute bony abnormality. IMPRESSION: Moderate to large volume pelvic ascites of unknown etiology. Cholelithiasis. No CT evidence for acute cholecystitis. No biliary ductal dilatation. Electronically Signed   By: Rolm Baptise M.D.   On: 06/01/2017 12:08      IMPRESSION AND PLAN:   Ascites, unclear etiology.  Need to rule out infection or malignancy. The patient will be admitted to medical floor. Paracentesis for diagnosis and treatment.   Leukocytosis, unclear etiology, start Rocephin and Flagyl, follow-up CBC. Acute renal failure.  Start IV fluids support and follow-up BMP.  History of cervical cancer, status post surgery, radiation and chemotherapy in 2003. Cholelithiasis. No CT evidence for acute cholecystitis.   All the records are reviewed and case discussed with ED provider. Management plans discussed with the patient, her husband and they are in agreement.  CODE STATUS: Full code  TOTAL TIME TAKING CARE OF THIS PATIENT: 56 minutes.    Demetrios Loll M.D on 06/01/2017 at 1:59 PM  Between 7am to 6pm - Pager - [phone]  After 6pm go to [email or web address] - Proofreader  [facility]  Office  [phone]  CC: Primary care physician; Sallee Lange, NP   Note: This dictation was prepared with Dragon dictation along with smaller phrase technology. Any transcriptional errors that result from this process are unin

## 2017-06-01 NOTE — ED Notes (Signed)
Patient transported to CT 

## 2017-06-01 NOTE — ED Provider Notes (Signed)
[facility]    CSN: [id number] Arrival date & time: 06/01/17  0831  History   Chief Complaint Chief Complaint  Patient presents with  . Bloated   HPI  47 year old female with a complicated past medical history including history of cervical cancer status post radical hysterectomy and radiation, incontinence and neurogenic bladder with regular intermittent catheterization presents with abdominal pain.  Patient reports that around 530 this morning she developed suprapubic pain.  This subsequently worsened and she is now experiencing diffuse lower abdominal pain.  Pain is 5/10 in severity. No reports of fevers or chills.  She has had some ongoing nausea over the past week.  No reports of dysuria (states that she also voids on her own in addition to intermittent catheterization).  Patient states that this is different than prior UTIs.  No back pain or flank pain.  She does report some issues with constipation and hard stool.  No known inciting factor.  No known exacerbating or relieving factors.  No other complaints or concerns at this time.  PMH: DVT (deep venous thrombosis) (CMS-HCC)  In setting of cancer  Neurogenic bladder  Permanent 2/2 radiation damage from cervical ca treatment. 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 s/p radical hysterectomy followed by both chemo & XRT.  Urinary retention  Permanent 2/2 neurogenic bladder that is the result of XRT from cervical ca treatments.    Surgical Hx: EXPLORATORY LAPAROTOMY 2003  supraceverical hysterectomy, radical trachelectomy, lymph node dissection  Jaw surgery   2/2 Fracture  Wisdom Tooth Extraction     HYSTERECTOMY 2003  supraceverical hysterectomy, radical trachelectomy, lymph node dissection 2/2 path showing cervical  ca. Ovaries still in place.    OB History    No data available       Home Medications    Prior to Admission medications   Medication Sig Start Date End Date Taking? Authorizing Provider  busPIRone (BUSPAR) 15 MG tablet  01/29/17  Yes [provider]  Catheters (INTERMITTENT 14FR/40CM) MISC Use for urinary self-catheterization 3 times a day as directed 09/21/14  Yes [provider]  cetirizine (ZYRTEC) 10 MG tablet Take by mouth.   Yes [provider]  Cholecalciferol (VITAMIN D3) 5000 units TABS Take by mouth.   Yes [provider]  Cranberry 1000 MG CAPS Take by mouth.   Yes [provider]  enalapril (VASOTEC) 5 MG tablet  01/21/17  Yes [provider]  fluticasone (FLONASE) 50 MCG/ACT nasal spray Place into the nose.   Yes [provider]  levothyroxine (SYNTHROID, LEVOTHROID) 88 MCG tablet  02/05/17  Yes [provider]  mirabegron ER (MYRBETRIQ) 50 MG TB24 tablet Take 1 tablet (50 mg total) by mouth daily. 04/21/17  Yes MacDiarmid, Nicki Reaper, MD  Probiotic Product (ALOE 81448 & PROBIOTICS PO) Take by mouth.   Yes [provider]  traZODone (DESYREL) 50 MG tablet Take 1-2 tablets by mouth at bedtime.   Yes [provider]  trimethoprim (TRIMPEX) 100 MG tablet Take 1 tablet (100 mg total) by mouth daily. 03/12/17  Yes MacDiarmid, Nicki Reaper, MD  nitrofurantoin, macrocrystal-monohydrate, (MACROBID) 100 MG capsule Take 1 capsule (100 mg total) by mouth 2 (two) times daily. 06/01/17   [name], DO  sertraline (ZOLOFT) 100 MG tablet  Take by mouth.    [provider]   Family History Family History  Problem Relation Age of Onset  . Bladder Cancer Neg Hx   . Kidney cancer Neg Hx    Social History Social History   Tobacco Use  . Smoking status: Never Smoker  . Smokeless tobacco: Never Used  Substance Use Topics  . Alcohol use: Yes  . Drug use: No    Allergies   Penicillins and Sulfa  antibiotics   Review of Systems Review of Systems  Constitutional: Negative for chills and fever.  Gastrointestinal: Positive for abdominal pain, constipation and nausea.  Genitourinary: Negative for dysuria and frequency.   Physical Exam Triage Vital Signs ED Triage Vitals  Enc Vitals Group     BP 06/01/17 0844 118/87     Pulse Rate 06/01/17 0844 98     Resp 06/01/17 0844 16     Temp 06/01/17 0844 98.4 F (36.9 C)     Temp Source 06/01/17 0844 Oral     SpO2 06/01/17 0844 97 %     Weight 06/01/17 0846 175 lb (79.4 kg)     Height 06/01/17 0846 5\' 2"  (1.575 m)     Head Circumference --      Peak Flow --      Pain Score 06/01/17 0846 5     Pain Loc --      Pain Edu? --      Excl. in [place]? --    Updated Vital Signs BP 118/87 (BP Location: Left Arm)   Pulse 98   Temp 98.4 F (36.9 C) (Oral)   Resp 16   Ht 5\' 2"  (1.575 m)   Wt 175 lb (79.4 kg)   SpO2 97%   BMI 32.01 kg/m    Physical Exam  Constitutional: She is oriented to person, place, and time. She appears well-developed. No distress.  Cardiovascular: Normal rate and regular rhythm.  No murmur heard. Pulmonary/Chest: Effort normal and breath sounds normal. She has no wheezes. She has no rales.  Abdominal:  Soft. Tender to palpation in the periumbilical region as well as the left lower quadrant, suprapubic region, and right lower quadrant.  No clear distention.  Neurological: She is alert and oriented to person, place, and time.  Psychiatric: She has a normal mood and affect. Her behavior is normal.  Nursing note and vitals reviewed.  UC Treatments / Results  Labs (all labs ordered are listed, but only abnormal results are displayed) Labs Reviewed  URINALYSIS, COMPLETE (UACMP) WITH MICROSCOPIC - Abnormal; Notable for the following components:      Result Value   Color, Urine AMBER (*)    Specific Gravity, Urine >1.030 (*)    Hgb urine dipstick LARGE (*)    Protein, ur 100 (*)    Squamous Epithelial / LPF 0-5  (*)    Bacteria, UA RARE (*)    All other components within normal limits  URINE CULTURE    EKG  EKG Interpretation None       Radiology No results found.  Procedures Procedures (including critical care time)  Medications Ordered in UC Medications - No data to display   Initial Impression / Assessment and Plan / UC Course  I have reviewed the triage vital signs and the nursing notes.  Pertinent labs & imaging results that were available during my care of the patient were reviewed by me and considered in my medical decision making (see chart for details).    47 year old female  presents with sudden onset lower abdominal pain.  The etiology of her clinical picture is unclear at this time.  She is at high risk for UTI as she has neurogenic bladder and intermittently catheterizes.  She had a large amount of blood in her urine and 6-30 white blood cells on microscopy.  Given this finding, I am sending her urine for culture and starting her empirically on Macrobid.  We discussed potential need for imaging as she is having significant pain which is out of character for her and how her UTIs normally present.  We discussed going to the ER.  Patient and her husband are going to decide whether they feel she needs to go to the ER.  Final Clinical Impressions(s) / UC Diagnoses   Final diagnoses:  Lower abdominal pain    ED Discharge Orders        Ordered    nitrofurantoin, macrocrystal-monohydrate, (MACROBID) 100 MG capsule  2 times daily     06/01/17 0924     Controlled Substance Prescriptions [place] Controlled Substance Registry consulted? Not Applicable   [name], DO 06/01/17 0930

## 2017-06-01 NOTE — Discharge Instructions (Signed)
If your pain worsens, go to the ER for evaluation/imaging.  Start the antibiotic.  Miralax for constipation.  Take care  Dr. Lacinda Axon

## 2017-06-01 NOTE — ED Triage Notes (Signed)
Awoke this morning at 0530 with abdominal pain.  C/O initially pain was suprapubic, now pain is more generalized.  Nausea is associated with symptoms, but no vomiting and diarrhea. Seen through Urgent Care PTA, urine sample had "a lot of blood" and patient was treated for UTI, but patient is not sure that is the problem, so patient came to ED for evaluation.  Patient self caths for urine.

## 2017-06-01 NOTE — ED Notes (Signed)
MD at bedside. 

## 2017-06-01 NOTE — ED Triage Notes (Signed)
Per patient lower pelvic pain / feel bloated and not having normal stool.

## 2017-06-01 NOTE — ED Provider Notes (Addendum)
[facility] Emergency Department Provider Note  ____________________________________________  Time seen: Approximately 11:33 AM  I have reviewed the triage vital signs and the nursing notes.   HISTORY  Chief Complaint Abdominal Pain   HPI Cynthia Mccarthy is a 47 y.o. female with a history of cervical cancer status post hysterectomy/radiation/chemotherapy complicated by a neurogenic bladder requiring multiple daily self caths who presents for evaluation of abdominal pain. patient reports that she woke up with a burning sensation in her suprapubic region. She reports that the pain then became sharp and diffuse throughout her abdomen, currently moderate 6/10, constant since this morning and non-radiating.She has had nausea but no vomiting, no chills or fever, no dysuria. Patient reports feeling the urge to have a BM but unable to do so. Had a small BM here in the ED. Had loose light colored stool yesterday. Passing flatus. Patient reports feeling bloated and unable to buckle her pants this am. [name] to UC and found to have hematuria and was sent here for further evaluation.  Past Medical History:  Diagnosis Date  . Anxiety   . Cervical cancer ([place])   . Depression   . DVT (deep venous thrombosis) ([place])   . Hormone disorder   . Hypertension   . Hypothyroidism     There are no active problems to display for this patient.   Past Surgical History:  Procedure Laterality Date  . ABDOMINAL HYSTERECTOMY      Prior to Admission medications   Medication Sig Start Date End Date Taking? Authorizing Provider  busPIRone (BUSPAR) 15 MG tablet Take 15 mg by mouth daily.  01/29/17  Yes [provider]  cetirizine (ZYRTEC) 10 MG tablet Take 10 mg by mouth daily.    Yes [provider]  Cholecalciferol (VITAMIN D3) 5000 units TABS Take 1 tablet by mouth daily.    Yes [provider]  Cranberry 1000 MG CAPS Take 1 capsule by mouth daily.    Yes  [provider]  enalapril (VASOTEC) 5 MG tablet Take 5 mg by mouth daily.  01/21/17  Yes [provider]  levothyroxine (SYNTHROID, LEVOTHROID) 88 MCG tablet Take 88 mcg by mouth daily before breakfast.  02/05/17  Yes [provider]  mirabegron ER (MYRBETRIQ) 50 MG TB24 tablet Take 1 tablet (50 mg total) by mouth daily. 04/21/17  Yes MacDiarmid, Nicki Reaper, MD  Probiotic Product (ALOE 36629 & PROBIOTICS PO) Take 1 capsule by mouth daily.    Yes [provider]  sertraline (ZOLOFT) 100 MG tablet Take 100 mg by mouth daily.    Yes [provider]  traZODone (DESYREL) 50 MG tablet Take 1-2 tablets by mouth at bedtime.   Yes [provider]  trimethoprim (TRIMPEX) 100 MG tablet Take 1 tablet (100 mg total) by mouth daily. 03/12/17  Yes MacDiarmid, Nicki Reaper, MD  Catheters (INTERMITTENT 14FR/40CM) MISC Use for urinary self-catheterization 3 times a day as directed 09/21/14   [provider]  fluticasone (FLONASE) 50 MCG/ACT nasal spray Place 2 sprays into both nostrils daily.     [provider]  nitrofurantoin, macrocrystal-monohydrate, (MACROBID) 100 MG capsule Take 1 capsule (100 mg total) by mouth 2 (two) times daily. Patient not taking: Reported on 06/01/2017 06/01/17   [name], DO    Allergies Penicillins and Sulfa antibiotics  Family History  Problem Relation Age of Onset  . Bladder Cancer Neg Hx   . Kidney cancer Neg Hx     Social History Social History  Tobacco Use  . Smoking status: Never Smoker  . Smokeless tobacco: Never Used  Substance Use Topics  . Alcohol use: Yes  . Drug use: No    Review of Systems  Constitutional: Negative for fever. Eyes: Negative for visual changes. ENT: Negative for sore throat. Neck: No neck pain  Cardiovascular: Negative for chest pain. Respiratory: Negative for shortness of breath. Gastrointestinal: + abdominal pain, bloating, nausea. No vomiting, diarrhea, or  constipation Genitourinary: Negative for dysuria. Musculoskeletal: Negative for back pain. Skin: Negative for rash. Neurological: Negative for headaches, weakness or numbness. Psych: No SI or HI  ____________________________________________   PHYSICAL EXAM:  VITAL SIGNS: ED Triage Vitals  Enc Vitals Group     BP 06/01/17 0958 119/82     Pulse Rate 06/01/17 0958 88     Resp 06/01/17 0958 18     Temp 06/01/17 0958 98.4 F (36.9 C)     Temp Source 06/01/17 0958 Oral     SpO2 06/01/17 0958 97 %     Weight --      Height --      Head Circumference --      Peak Flow --      Pain Score 06/01/17 1005 5     Pain Loc --      Pain Edu? --      Excl. in [place]? --     Constitutional: Alert and oriented. Well appearing and in no apparent distress. HEENT:      Head: Normocephalic and atraumatic.         Eyes: Conjunctivae are normal. Sclera is non-icteric.       Mouth/Throat: Mucous membranes are moist.       Neck: Supple with no signs of meningismus. Cardiovascular: Regular rate and rhythm. No murmurs, gallops, or rubs. 2+ symmetrical distal pulses are present in all extremities. No JVD. Respiratory: Normal respiratory effort. Lungs are clear to auscultation bilaterally. No wheezes, crackles, or rhonchi.  Gastrointestinal: Soft, diffusely tender, and non distended with positive bowel sounds. No rebound or guarding. Musculoskeletal: Nontender with normal range of motion in all extremities. No edema, cyanosis, or erythema of extremities. Neurologic: Normal speech and language. Face is symmetric. Moving all extremities. No gross focal neurologic deficits are appreciated. Skin: Skin is warm, dry and intact. No rash noted. Psychiatric: Mood and affect are normal. Speech and behavior are normal.  ____________________________________________   LABS (all labs ordered are listed, but only abnormal results are displayed)  Labs Reviewed  COMPREHENSIVE METABOLIC PANEL - Abnormal; Notable for  the following components:      Result Value   Glucose, Bld 118 (*)    BUN 36 (*)    Creatinine, Ser 1.63 (*)    GFR calc non Af Amer 36 (*)    GFR calc Af Amer 42 (*)    All other components within normal limits  CBC - Abnormal; Notable for the following components:   WBC 18.8 (*)    RBC 5.21 (*)    All other components within normal limits  URINALYSIS, COMPLETE (UACMP) WITH MICROSCOPIC - Abnormal; Notable for the following components:   Color, Urine YELLOW (*)    APPearance HAZY (*)    Hgb urine dipstick MODERATE (*)    Leukocytes, UA TRACE (*)    Squamous Epithelial / LPF 0-5 (*)    All other components within normal limits  URINE CULTURE  LIPASE, BLOOD   ____________________________________________  EKG  none ____________________________________________  RADIOLOGY  CT a/p: Moderate to  large volume pelvic ascites of unknown etiology. Cholelithiasis. No CT evidence for acute cholecystitis. No biliary ductal dilatation.  ____________________________________________   PROCEDURES  Procedure(s) performed: None Procedures Critical Care performed:  None ____________________________________________   INITIAL IMPRESSION / ASSESSMENT AND PLAN / ED COURSE  47 y.o. female with a history of cervical cancer status post hysterectomy/radiation/chemotherapy complicated by a neurogenic bladder requiring multiple daily self caths who presents for evaluation of diffuse abdominal pain, bloating, nausea since this am. patient is well-appearing, in no distress, is normal vital signs, abdomen soft with mild tenderness throughout, no rebound or guarding, positive bowel sounds.labs show leukocytosis with white count of 18.8 and acute kidney injury with a creatinine of 1.63. Differential diagnoses including SBO versus colitis versus diverticulitis versus UTI versus pyelonephritis. Plan for CT abdomen and pelvis, IV fluids, Zofran, and pain management.  _________________________ 12:16 PM on  06/01/2017 -----------------------------------------  CT concerning for new moderate to large volume pelvic ascites and the patient with prior history of malignancy concerning for malignant ascites. Patient denies history of drinking or hepatitis. Since she also has leukocytosis I will treat her with abx for possible SBP. Patient will need large volume paracentesis. Patient also has acute kidney injury. At this time we'll admit to the hospitalist for paracentesis and IV hydration.  As part of my medical decision making, I reviewed the following data within the [facility] notes reviewed and incorporated, Labs reviewed , Radiograph reviewed , Discussed with admitting physician , Notes from prior ED visits and [place] Controlled Substance Database    Pertinent labs & imaging results that were available during my care of the patient were reviewed by me and considered in my medical decision making (see chart for details).    ____________________________________________   FINAL CLINICAL IMPRESSION(S) / ED DIAGNOSES  Final diagnoses:  Other ascites  AKI (acute kidney injury) ([place])  Leukocytosis, unspecified type      NEW MEDICATIONS STARTED DURING THIS VISIT:  ED Discharge Orders    None       Note:  This document was prepared using Dragon voice recognition software and may include unintentional dictation errors.    Alfred Levins, [place], MD 06/01/17 [place], [place], MD 06/01/17 [phone]

## 2017-06-01 NOTE — ED Notes (Signed)
First Nurse Note: Pt states that she was seen at Urgent care in [facility] for abdominal pain. Pt states that she was told she needed to come to the ED for a CT scan. Pt in NAD at this time.

## 2017-06-02 ENCOUNTER — Inpatient Hospital Stay: Payer: BC Managed Care – PPO

## 2017-06-02 DIAGNOSIS — Z90710 Acquired absence of both cervix and uterus: Secondary | ICD-10-CM

## 2017-06-02 DIAGNOSIS — R188 Other ascites: Principal | ICD-10-CM

## 2017-06-02 DIAGNOSIS — Z9221 Personal history of antineoplastic chemotherapy: Secondary | ICD-10-CM

## 2017-06-02 DIAGNOSIS — Z923 Personal history of irradiation: Secondary | ICD-10-CM

## 2017-06-02 DIAGNOSIS — Z8541 Personal history of malignant neoplasm of cervix uteri: Secondary | ICD-10-CM

## 2017-06-02 DIAGNOSIS — N189 Chronic kidney disease, unspecified: Secondary | ICD-10-CM

## 2017-06-02 DIAGNOSIS — Z79899 Other long term (current) drug therapy: Secondary | ICD-10-CM

## 2017-06-02 DIAGNOSIS — I129 Hypertensive chronic kidney disease with stage 1 through stage 4 chronic kidney disease, or unspecified chronic kidney disease: Secondary | ICD-10-CM

## 2017-06-02 DIAGNOSIS — Z86718 Personal history of other venous thrombosis and embolism: Secondary | ICD-10-CM

## 2017-06-02 LAB — BASIC METABOLIC PANEL
ANION GAP: 7 (ref 5–15)
BUN: 35 mg/dL — ABNORMAL HIGH (ref 6–20)
CALCIUM: 8 mg/dL — AB (ref 8.9–10.3)
CO2: 23 mmol/L (ref 22–32)
Chloride: 109 mmol/L (ref 101–111)
Creatinine, Ser: 1.5 mg/dL — ABNORMAL HIGH (ref 0.44–1.00)
GFR, EST AFRICAN AMERICAN: 47 mL/min — AB (ref >60)
GFR, EST NON AFRICAN AMERICAN: 40 mL/min — AB (ref >60)
Glucose, Bld: 118 mg/dL — ABNORMAL HIGH (ref 65–99)
Potassium: 3.9 mmol/L (ref 3.5–5.1)
Sodium: 139 mmol/L (ref 135–145)

## 2017-06-02 LAB — CBC
HCT: 39.7 % (ref 35.0–47.0)
Hemoglobin: 13 g/dL (ref 12.0–16.0)
MCH: 28.9 pg (ref 26.0–34.0)
MCHC: 32.8 g/dL (ref 32.0–36.0)
MCV: 88.1 fL (ref 80.0–100.0)
Platelets: 334 10*3/uL (ref 150–440)
RBC: 4.51 MIL/uL (ref 3.80–5.20)
RDW: 13.8 % (ref 11.5–14.5)
WBC: 13.4 10*3/uL — ABNORMAL HIGH (ref 3.6–11.0)

## 2017-06-02 LAB — COMPREHENSIVE METABOLIC PANEL
ALBUMIN: 3.8 g/dL (ref 3.5–5.0)
ALK PHOS: 36 U/L — AB (ref 38–126)
ALT: 13 U/L — AB (ref 14–54)
ANION GAP: 11 (ref 5–15)
AST: 18 U/L (ref 15–41)
BUN: 38 mg/dL — ABNORMAL HIGH (ref 6–20)
CALCIUM: 8.4 mg/dL — AB (ref 8.9–10.3)
CO2: 19 mmol/L — AB (ref 22–32)
Chloride: 109 mmol/L (ref 101–111)
Creatinine, Ser: 2.13 mg/dL — ABNORMAL HIGH (ref 0.44–1.00)
GFR calc Af Amer: 30 mL/min — ABNORMAL LOW (ref >60)
GFR calc non Af Amer: 26 mL/min — ABNORMAL LOW (ref >60)
GLUCOSE: 133 mg/dL — AB (ref 65–99)
Potassium: 3.9 mmol/L (ref 3.5–5.1)
SODIUM: 139 mmol/L (ref 135–145)
Total Bilirubin: 0.4 mg/dL (ref 0.3–1.2)
Total Protein: 7 g/dL (ref 6.5–8.1)

## 2017-06-02 LAB — URINE CULTURE: Culture: <10000 — AB

## 2017-06-02 MED ORDER — OXYCODONE HCL 5 MG PO TABS
5.0000 mg | ORAL_TABLET | Freq: Once | ORAL | Status: AC
  Administered 2017-06-02: 5 mg via ORAL
  Filled 2017-06-02: qty 1

## 2017-06-02 NOTE — Consult Note (Signed)
[facility] NOTE  Patient Care Team: Gauger, Victoriano Lain, NP as PCP - General (Internal Medicine)  CHIEF COMPLAINTS/PURPOSE OF CONSULTATION:  Abdominal pain/pelvic ascites .  HISTORY OF PRESENTING ILLNESS:  Cynthia Mccarthy 47 y.o.  female with a previous history of stage III cervical cancer [as per history] currently admitted to the hospital for worsening abdominal pain.  With regards to history of cervical cancer-diagnosed in 2003 patient received care with Dr. Fransisca Connors in [place].  As per patient she was thought initially to have endometriosis however at surgery noted to have cervical cancer "stage IIIb".  She had hysterectomy; but states she has intact ovaries.  Post surgery she was treated with chemoradiation.   Patient states that she had a similar episode of abdominal pain approximately a year ago; progressive getting worse over the last few weeks and then on the day of admission was fairly intense that needed admission to the hospital.  CT scan-noncontrast showed moderate to large pelvic ascites; none in the abdomen.   Of note patient had been diagnosed with chronic kidney disease-sometime in October of last year.  She does complain of frequent UTIs; also "neurogenic bladder"question secondary radiation.  Patient catheterizes herself at least once a day previously evaluated by urology.  ROS: She denies any swelling of the legs.  Denies any A complete 10 point review of system is done which is negative except mentioned above in history of present illness  MEDICAL HISTORY:  Past Medical History:  Diagnosis Date  . Anxiety   . Cervical cancer ([place])   . Depression   . DVT (deep venous thrombosis) ([place])   . Hormone disorder   . Hypertension   . Hypothyroidism     SURGICAL HISTORY: Past Surgical History:  Procedure Laterality Date  . ABDOMINAL HYSTERECTOMY      SOCIAL HISTORY: Social History   Socioeconomic History  . Marital status: Married    Spouse  name: Not on file  . Number of children: Not on file  . Years of education: Not on file  . Highest education level: Not on file  Social Needs  . Financial resource strain: Not on file  . Food insecurity - worry: Not on file  . Food insecurity - inability: Not on file  . Transportation needs - medical: Not on file  . Transportation needs - non-medical: Not on file  Occupational History  . Not on file  Tobacco Use  . Smoking status: Never Smoker  . Smokeless tobacco: Never Used  Substance and Sexual Activity  . Alcohol use: Yes  . Drug use: No  . Sexual activity: Not on file  Other Topics Concern  . Not on file  Social History Narrative  . Not on file    FAMILY HISTORY: Family History  Problem Relation Age of Onset  . Bladder Cancer Neg Hx   . Kidney cancer Neg Hx     ALLERGIES:  is allergic to penicillins and sulfa antibiotics.  MEDICATIONS:  Current Facility-Administered Medications  Medication Dose Route Frequency Provider Last Rate Last Dose  . 0.9 %  sodium chloride infusion  250 mL Intravenous PRN Demetrios Loll, MD      . acetaminophen (TYLENOL) tablet 650 mg  650 mg Oral Q6H PRN Demetrios Loll, MD       Or  . acetaminophen (TYLENOL) suppository 650 mg  650 mg Rectal Q6H PRN Demetrios Loll, MD      . albuterol (PROVENTIL) (2.5 MG/3ML) 0.083% nebulizer solution 2.5 mg  2.5 mg Nebulization Q2H PRN Demetrios Loll, MD      . bisacodyl (DULCOLAX) EC tablet 5 mg  5 mg Oral Daily PRN Demetrios Loll, MD      . busPIRone (BUSPAR) tablet 15 mg  15 mg Oral Daily Demetrios Loll, MD   15 mg at 06/02/17 3244  . cefTRIAXone (ROCEPHIN) 2 g in dextrose 5 % 50 mL IVPB  2 g Intravenous Q24H Pernell Dupre, RPH   Stopped at 06/02/17 1439  . fluticasone (FLONASE) 50 MCG/ACT nasal spray 2 spray  2 spray Each Nare Daily Demetrios Loll, MD   2 spray at 06/02/17 0807  . heparin injection 5,000 Units  5,000 Units Subcutaneous Q8H Demetrios Loll, MD   5,000 Units at 06/03/17 0510  . HYDROcodone-acetaminophen  (NORCO/VICODIN) 5-325 MG per tablet 1-2 tablet  1-2 tablet Oral Q4H PRN Demetrios Loll, MD   2 tablet at 06/03/17 0411  . HYDROmorphone (DILAUDID) injection 0.25 mg  0.25 mg Intravenous Q6H PRN Lance Coon, MD   0.25 mg at 06/03/17 0510  . levothyroxine (SYNTHROID, LEVOTHROID) tablet 88 mcg  88 mcg Oral QAC breakfast Demetrios Loll, MD   88 mcg at 06/02/17 0102  . loratadine (CLARITIN) tablet 10 mg  10 mg Oral Daily Demetrios Loll, MD   10 mg at 06/02/17 7253  . metroNIDAZOLE (FLAGYL) IVPB 500 mg  500 mg Intravenous Q8H Demetrios Loll, MD   Stopped at 06/03/17 [phone]  . mirabegron ER (MYRBETRIQ) tablet 50 mg  50 mg Oral Daily Demetrios Loll, MD   50 mg at 06/02/17 0807  . ondansetron (ZOFRAN) tablet 4 mg  4 mg Oral Q6H PRN Demetrios Loll, MD       Or  . ondansetron [facility]) injection 4 mg  4 mg Intravenous Q6H PRN Demetrios Loll, MD   4 mg at 06/02/17 1734  . senna-docusate (Senokot-S) tablet 1 tablet  1 tablet Oral QHS PRN Demetrios Loll, MD   1 tablet at 06/01/17 1611  . sertraline (ZOLOFT) tablet 100 mg  100 mg Oral Daily Demetrios Loll, MD   100 mg at 06/02/17 0347  . sodium chloride flush (NS) 0.9 % injection 3 mL  3 mL Intravenous Q12H Demetrios Loll, MD   3 mL at 06/02/17 2129  . sodium chloride flush (NS) 0.9 % injection 3 mL  3 mL Intravenous PRN Demetrios Loll, MD      . traZODone (DESYREL) tablet 50-100 mg  50-100 mg Oral QHS Demetrios Loll, MD   100 mg at 06/02/17 2125      .  PHYSICAL EXAMINATION:  Vitals:   06/02/17 1952 06/03/17 0420  BP: (!) 152/76 121/72  Pulse: 85 84  Resp: 14 15  Temp: 98 F (36.7 C) 98.4 F (36.9 C)  SpO2: 97% 97%   Filed Weights   06/01/17 1443  Weight: 175 lb (79.4 kg)    GENERAL: Well-nourished well-developed; Alert, no distress and comfortable.   With her husband.  EYES: no pallor or icterus OROPHARYNX: no thrush or ulceration. NECK: supple, no masses felt LYMPH:  no palpable lymphadenopathy in the cervical, axillary or inguinal regions LUNGS: decreased breath sounds to auscultation  at bases and  No wheeze or crackles HEART/CVS: regular rate & rhythm and no murmurs; No lower extremity edema ABDOMEN: abdomen soft, tender on deep palpation.  And normal bowel sounds;  Musculoskeletal:no cyanosis of digits and no clubbing  PSYCH: alert & oriented x 3 with fluent speech NEURO: no focal motor/sensory deficits SKIN:  no rashes  or significant lesions  LABORATORY DATA:  I have reviewed the data as listed Lab Results  Component Value Date   WBC 14.2 (H) 06/03/2017   HGB 13.6 06/03/2017   HCT 41.4 06/03/2017   MCV 88.4 06/03/2017   PLT 346 06/03/2017   Recent Labs    06/01/17 1006 06/02/17 0340 06/02/17 1557  NA 139 139 139  K 4.4 3.9 3.9  CL 107 109 109  CO2 22 23 19*  GLUCOSE 118* 118* 133*  BUN 36* 35* 38*  CREATININE 1.63* 1.50* 2.13*  CALCIUM 9.7 8.0* 8.4*  GFRNONAA 36* 40* 26*  GFRAA 42* 47* 30*  PROT 7.6  --  7.0  ALBUMIN 4.3  --  3.8  AST 22  --  18  ALT 18  --  13*  ALKPHOS 44  --  36*  BILITOT 0.5  --  0.4    RADIOGRAPHIC STUDIES: I have personally reviewed the radiological images as listed and agreed with the findings in the report. Ct Abdomen Pelvis Wo Contrast  Result Date: 06/01/2017 CLINICAL DATA:  Bilateral lower abdominal pain EXAM: CT ABDOMEN AND PELVIS WITHOUT CONTRAST TECHNIQUE: Multidetector CT imaging of the abdomen and pelvis was performed following the standard protocol without IV contrast. COMPARISON:  Ultrasound 03/06/2017 FINDINGS: Lower chest: Lung bases are clear. No effusions. Heart is normal size. Hepatobiliary: 1.5 cm gallstone within the gallbladder. No focal hepatic abnormality. Pancreas: No focal abnormality or ductal dilatation. Spleen: No focal abnormality.  Normal size. Adrenals/Urinary Tract: No adrenal abnormality. No focal renal abnormality. No stones or hydronephrosis. Urinary bladder is unremarkable. Stomach/Bowel: Apparent prior appendectomy. Stomach, large and small bowel grossly unremarkable. Vascular/Lymphatic: No  evidence of aneurysm or adenopathy. Reproductive: Prior hysterectomy.  No adnexal masses. Other: Moderate to large volume ascites noted in the pelvis. Small amount adjacent to the liver margin. No free air. Musculoskeletal: No acute bony abnormality. IMPRESSION: Moderate to large volume pelvic ascites of unknown etiology. Cholelithiasis. No CT evidence for acute cholecystitis. No biliary ductal dilatation. Electronically Signed   By: Rolm Baptise M.D.   On: 06/01/2017 12:08   US Abdomen Limited  Result Date: 06/02/2017 CLINICAL DATA:  Ascites visualized in the pelvis by CT. EXAM: LIMITED ABDOMEN ULTRASOUND FOR ASCITES TECHNIQUE: Limited ultrasound survey for ascites was performed in all four abdominal quadrants. COMPARISON:  CT of the abdomen and pelvis on 06/01/2017 FINDINGS: By ultrasound, there is only a small amount of ascites in the right lower quadrant and adjacent to the liver. An adequate pocket of ascites was not present to allow safe percutaneous approach for paracentesis today. Based on the CT, most of the free fluid is likely in the posterior pelvis when the patient is in a supine position. IMPRESSION: Only a small amount of ascites is identified by ultrasound with no approachable pocket for safe paracentesis. Electronically Signed   By: Aletta Edouard M.D.   On: 06/02/2017 11:35    ASSESSMENT & PLAN:   # 47 year old female patient history of stage III cervical cancer-currently admitted to the hospital for worsening abdominal pain noted to have pelvic ascites  # Pelvic ascites moderate to large-question etiology-inflammatory versus malignant [remote history of cervical cancer].  Question because of abdominal pain.  Awaiting GI evaluation. Not easily accessible to IR drainage.  Discussed with Dr. Vernard Gambles IR-recommend CT-guided aspiration of the pelvic fluid. Check Ca-125;CEA.   # Cervical cancer stage III [as per history; 2003]-treated by chemoradiation.  It is unclear if pelvic ascites is  related to recurrence  of her cervical cancer [statistically less likely; but still possible].  Will discuss with Dr. Fransisca Connors; Gyn-Onc who will be available on January 16th consultation.  #CKD-with worsening renal function; question etiology.  Await nephrology evaluation.  Thank you Dr. Margaretmary Eddy for allowing me to participate in the care of your pleasant patient. Please do not hesitate to contact me with questions or concerns in the interim. Discussed with Dr.Gouru.   #Above plan of care was discussed the patient and family in detail.  All questions answered. The patient knows to call the clinic with any problems, questions or concerns.  # I reviewed the blood work- with the patient in detail; also reviewed the imaging independently [as summarized above]; and with the patient in detail.     Cammie Sickle, MD 06/03/2017 9:06 AM

## 2017-06-02 NOTE — Progress Notes (Signed)
[facility] [place] NAME: Cynthia Mccarthy    MR#:  [id number]  DATE OF BIRTH:  1970/12/20  SUBJECTIVE:  CHIEF COMPLAINT: Patient's paracentesis was not done as she does not have enough fluid but patient is reporting generalized abdominal pain associated with nausea.  Patient also is reporting abdominal bloating for the past 2 months.  REVIEW OF SYSTEMS:  CONSTITUTIONAL: No fever, fatigue or weakness.  EYES: No blurred or double vision.  EARS, NOSE, AND THROAT: No tinnitus or ear pain.  RESPIRATORY: No cough, shortness of breath, wheezing or hemoptysis.  CARDIOVASCULAR: No chest pain, orthopnea, edema.  GASTROINTESTINAL: Patient is reporting nausea, denies v vomiting, diarrhea  Has generalized abdominal pain  And bloating GENITOURINARY: No dysuria, hematuria.  ENDOCRINE: No polyuria, nocturia,  HEMATOLOGY: No anemia, easy bruising or bleeding SKIN: No rash or lesion. MUSCULOSKELETAL: No joint pain or arthritis.   NEUROLOGIC: No tingling, numbness, weakness.  PSYCHIATRY: No anxiety or depression.   DRUG ALLERGIES:   Allergies  Allergen Reactions  . Penicillins Rash    Has patient had a PCN reaction causing immediate rash, facial/tongue/throat swelling, SOB or lightheadedness with hypotension: Yes Has patient had a PCN reaction causing severe rash involving mucus membranes or skin necrosis: No Has patient had a PCN reaction that required hospitalization: No Has patient had a PCN reaction occurring within the last 10 years: Yes If all of the above answers are "NO", then may proceed with Cephalosporin use.  . Sulfa Antibiotics Hives and Rash    VITALS:  Blood pressure (!) 92/52, pulse 76, temperature 97.9 F (36.6 C), temperature source Oral, resp. rate 13, height 5\' 2"  (1.575 m), weight 79.4 kg (175 lb), SpO2 95 %.  PHYSICAL EXAMINATION:  GENERAL:  47 y.o.-year-old patient lying in the bed with no acute distress.  EYES: Pupils  equal, round, reactive to light and accommodation. No scleral icterus. Extraocular muscles intact.  HEENT: Head atraumatic, normocephalic. Oropharynx and nasopharynx clear.  NECK:  Supple, no jugular venous distention. No thyroid enlargement, no tenderness.  LUNGS: Normal breath sounds bilaterally, no wheezing, rales,rhonchi or crepitation. No use of accessory muscles of respiration.  CARDIOVASCULAR: S1, S2 normal. No murmurs, rubs, or gallops.  ABDOMEN: Soft, generalized tenderness is present but no rebound tenderness, nondistended. Bowel sounds present.  EXTREMITIES: No pedal edema, cyanosis, or clubbing.  NEUROLOGIC: Cranial nerves II through XII are intact. Muscle strength 5/5 in all extremities. Sensation intact. Gait not checked.  PSYCHIATRIC: The patient is alert and oriented x 3.  SKIN: No obvious rash, lesion, or ulcer.    LABORATORY PANEL:   CBC Recent Labs  Lab 06/02/17 0340  WBC 13.4*  HGB 13.0  HCT 39.7  PLT 334   ------------------------------------------------------------------------------------------------------------------  Chemistries  Recent Labs  Lab 06/01/17 1006 06/02/17 0340  NA 139 139  K 4.4 3.9  CL 107 109  CO2 22 23  GLUCOSE 118* 118*  BUN 36* 35*  CREATININE 1.63* 1.50*  CALCIUM 9.7 8.0*  AST 22  --   ALT 18  --   ALKPHOS 44  --   BILITOT 0.5  --    ------------------------------------------------------------------------------------------------------------------  Cardiac Enzymes No results for input(s): TROPONINI in the last 168 hours. ------------------------------------------------------------------------------------------------------------------  RADIOLOGY:  Ct Abdomen Pelvis Wo Contrast  Result Date: 06/01/2017 CLINICAL DATA:  Bilateral lower abdominal pain EXAM: CT ABDOMEN AND PELVIS WITHOUT CONTRAST TECHNIQUE: Multidetector CT imaging of the abdomen and pelvis was performed following the standard protocol without IV  contrast.  COMPARISON:  Ultrasound 03/06/2017 FINDINGS: Lower chest: Lung bases are clear. No effusions. Heart is normal size. Hepatobiliary: 1.5 cm gallstone within the gallbladder. No focal hepatic abnormality. Pancreas: No focal abnormality or ductal dilatation. Spleen: No focal abnormality.  Normal size. Adrenals/Urinary Tract: No adrenal abnormality. No focal renal abnormality. No stones or hydronephrosis. Urinary bladder is unremarkable. Stomach/Bowel: Apparent prior appendectomy. Stomach, large and small bowel grossly unremarkable. Vascular/Lymphatic: No evidence of aneurysm or adenopathy. Reproductive: Prior hysterectomy.  No adnexal masses. Other: Moderate to large volume ascites noted in the pelvis. Small amount adjacent to the liver margin. No free air. Musculoskeletal: No acute bony abnormality. IMPRESSION: Moderate to large volume pelvic ascites of unknown etiology. Cholelithiasis. No CT evidence for acute cholecystitis. No biliary ductal dilatation. Electronically Signed   By: Rolm Baptise M.D.   On: 06/01/2017 12:08   US Abdomen Limited  Result Date: 06/02/2017 CLINICAL DATA:  Ascites visualized in the pelvis by CT. EXAM: LIMITED ABDOMEN ULTRASOUND FOR ASCITES TECHNIQUE: Limited ultrasound survey for ascites was performed in all four abdominal quadrants. COMPARISON:  CT of the abdomen and pelvis on 06/01/2017 FINDINGS: By ultrasound, there is only a small amount of ascites in the right lower quadrant and adjacent to the liver. An adequate pocket of ascites was not present to allow safe percutaneous approach for paracentesis today. Based on the CT, most of the free fluid is likely in the posterior pelvis when the patient is in a supine position. IMPRESSION: Only a small amount of ascites is identified by ultrasound with no approachable pocket for safe paracentesis. Electronically Signed   By: Aletta Edouard M.D.   On: 06/02/2017 11:35    EKG:  No orders found for this or any previous visit.  ASSESSMENT  AND PLAN:   #Acute generalized abdominal pain-unclear etiology Pain management as needed GI consult and oncology consult ppi Gentle hydration with IV fluids  #Acute kidney injury-secondary to poor p.o. intake , prerenal Hydrate with IV fluids and avoid nephrotoxins Monitor renal function closely Nephrology consult placed as the patient's husband is concerned about hepatorenal syndrome   # Ascites, unclear etiology.   Need to rule out infection or malignancy. CT abdomen and pelvis has revealed moderate to large volume pelvic ascites but unfortunately paracentesis cannot be done by the interventional radiology as there is no enough volume.  Cholelithiasis but no acute cholecystitis according to the CT  #Leukocytosis, unclear etiology,  Rocephin and Flagyl, follow-up CBC.  # History of cervical cancer, status post surgery, radiation and chemotherapy in 2003. Cholelithiasis. No CT evidence for acute cholecystitis       All the records are reviewed and case discussed with Care Management/Social Workerr. Management plans discussed with the patient, family and they are in agreement.  CODE STATUS: fc   TOTAL TIME TAKING CARE OF THIS PATIENT: 45  minutes.   POSSIBLE D/C IN 1-2 DAYS, DEPENDING ON CLINICAL CONDITION.  Note: This dictation was prepared with Dragon dictation along with smaller phrase technology. Any transcriptional errors that result from this process are unintentional.   Nicholes Mango M.D on 06/02/2017 at 3:16 PM  Between 7am to 6pm - Pager - [phone] After 6pm go to [email or web address] - password [email or web address]  Office  [phone]  CC: Primary care physician; Sallee Lange, NP

## 2017-06-02 NOTE — Progress Notes (Signed)
Patient presented to radiology department for possible paracentesis.  Limited US Abdomen performed; results dictated separately.  Only a trace amount of fluid identified which is not amenable to paracentesis.   Patient returned to unit.  No procedure performed.   Brynda Greathouse, MS RD PA-C 10:50 AM

## 2017-06-03 ENCOUNTER — Encounter: Payer: Self-pay | Admitting: Physician Assistant

## 2017-06-03 ENCOUNTER — Inpatient Hospital Stay: Payer: BC Managed Care – PPO

## 2017-06-03 DIAGNOSIS — R110 Nausea: Secondary | ICD-10-CM

## 2017-06-03 DIAGNOSIS — R102 Pelvic and perineal pain: Secondary | ICD-10-CM

## 2017-06-03 LAB — URINE CULTURE: CULTURE: NO GROWTH

## 2017-06-03 LAB — COMPREHENSIVE METABOLIC PANEL
ALK PHOS: 37 U/L — AB (ref 38–126)
ALT: 13 U/L — AB (ref 14–54)
AST: 17 U/L (ref 15–41)
Albumin: 3.8 g/dL (ref 3.5–5.0)
Anion gap: 10 (ref 5–15)
BILIRUBIN TOTAL: 0.5 mg/dL (ref 0.3–1.2)
BUN: 40 mg/dL — ABNORMAL HIGH (ref 6–20)
CO2: 19 mmol/L — ABNORMAL LOW (ref 22–32)
CREATININE: 2.77 mg/dL — AB (ref 0.44–1.00)
Calcium: 8.6 mg/dL — ABNORMAL LOW (ref 8.9–10.3)
Chloride: 107 mmol/L (ref 101–111)
GFR, EST AFRICAN AMERICAN: 22 mL/min — AB (ref >60)
GFR, EST NON AFRICAN AMERICAN: 19 mL/min — AB (ref >60)
Glucose, Bld: 131 mg/dL — ABNORMAL HIGH (ref 65–99)
Potassium: 3.9 mmol/L (ref 3.5–5.1)
Sodium: 136 mmol/L (ref 135–145)
TOTAL PROTEIN: 7.2 g/dL (ref 6.5–8.1)

## 2017-06-03 LAB — CBC
HCT: 41.4 % (ref 35.0–47.0)
Hemoglobin: 13.6 g/dL (ref 12.0–16.0)
MCH: 29 pg (ref 26.0–34.0)
MCHC: 32.8 g/dL (ref 32.0–36.0)
MCV: 88.4 fL (ref 80.0–100.0)
PLATELETS: 346 10*3/uL (ref 150–440)
RBC: 4.69 MIL/uL (ref 3.80–5.20)
RDW: 14 % (ref 11.5–14.5)
WBC: 14.2 10*3/uL — ABNORMAL HIGH (ref 3.6–11.0)

## 2017-06-03 LAB — HIV ANTIBODY (ROUTINE TESTING W REFLEX): HIV Screen 4th Generation wRfx: NONREACTIVE

## 2017-06-03 MED ORDER — SODIUM CHLORIDE 0.9 % IV SOLN
INTRAVENOUS | Status: DC
  Administered 2017-06-03 – 2017-06-06 (×6): via INTRAVENOUS

## 2017-06-03 MED ORDER — HYDROMORPHONE HCL 1 MG/ML IJ SOLN
0.5000 mg | INTRAMUSCULAR | Status: DC | PRN
Start: 2017-06-03 — End: 2017-06-04
  Administered 2017-06-03 – 2017-06-04 (×5): 0.5 mg via INTRAVENOUS
  Filled 2017-06-03 (×5): qty 1

## 2017-06-03 MED ORDER — HEPARIN SODIUM (PORCINE) 5000 UNIT/ML IJ SOLN
5000.0000 [IU] | Freq: Three times a day (TID) | INTRAMUSCULAR | Status: DC
  Administered 2017-06-04 – 2017-06-07 (×6): 5000 [IU] via SUBCUTANEOUS
  Filled 2017-06-03 (×7): qty 1

## 2017-06-03 NOTE — Progress Notes (Signed)
Cynthia Mccarthy   DOB:[birth year]   UU#:[id number]    Subjective: Patient had CT-guided aspiration of her pelvic  ascites this morning.  She notes to have slight improvement of the abdominal discomfort.  Is not complete resolved.  She has been taking Dilaudid /hydrocodone.  She notes to have nausea after hydrocodone.  Review system: No fever no chills.    Objective:  Vitals:   06/03/17 0420 06/03/17 1508  BP: 121/72 102/61  Pulse: 84 87  Resp: 15 20  Temp: 98.4 F (36.9 C) 98.2 F (36.8 C)  SpO2: 97% 96%     Intake/Output Summary (Last 24 hours) at 06/03/2017 1825 Last data filed at 06/03/2017 1600 Gross per 24 hour  Intake 282.5 ml  Output -  Net 282.5 ml    GENERAL: Well-nourished well-developed; Alert, no distress and comfortable.   With her family. EYES: no pallor or icterus OROPHARYNX: no thrush or ulceration. NECK: supple, no masses felt LYMPH:  no palpable lymphadenopathy in the cervical, axillary or inguinal regions LUNGS: decreased breath sounds to auscultation at bases and  No wheeze or crackles HEART/CVS: regular rate & rhythm and no murmurs; No lower extremity edema ABDOMEN: abdomen soft, tender on deep palpation.  And normal bowel sounds;  Musculoskeletal:no cyanosis of digits and no clubbing  PSYCH: alert & oriented x 3 with fluent speech NEURO: no focal motor/sensory deficits SKIN:  no rashes or significant lesions   Labs:  Lab Results  Component Value Date   WBC 14.2 (H) 06/03/2017   HGB 13.6 06/03/2017   HCT 41.4 06/03/2017   MCV 88.4 06/03/2017   PLT 346 06/03/2017    Lab Results  Component Value Date   NA 136 06/03/2017   K 3.9 06/03/2017   CL 107 06/03/2017   CO2 19 (L) 06/03/2017    Studies:  US Abdomen Limited  Result Date: 06/02/2017 CLINICAL DATA:  Ascites visualized in the pelvis by CT. EXAM: LIMITED ABDOMEN ULTRASOUND FOR ASCITES TECHNIQUE: Limited ultrasound survey for ascites was performed in all four abdominal quadrants. COMPARISON:   CT of the abdomen and pelvis on 06/01/2017 FINDINGS: By ultrasound, there is only a small amount of ascites in the right lower quadrant and adjacent to the liver. An adequate pocket of ascites was not present to allow safe percutaneous approach for paracentesis today. Based on the CT, most of the free fluid is likely in the posterior pelvis when the patient is in a supine position. IMPRESSION: Only a small amount of ascites is identified by ultrasound with no approachable pocket for safe paracentesis. Electronically Signed   By: Aletta Edouard M.D.   On: 06/02/2017 11:35   Ct Image Guided Drainage By Percutaneous Catheter  Result Date: 06/03/2017 CLINICAL DATA:  Abdominal pain. History of cervical carcinoma. CT demonstrates excess pelvic ascites. This seemed inaccessible on ultrasound. CT guided paracentesis requested. EXAM: CT GUIDED ASPIRATION BIOPSY OF PELVIC FLUID ANESTHESIA/SEDATION: Lidocaine 1% subcutaneous PROCEDURE: The procedure risks, benefits, and alternatives were explained to the patient. Questions regarding the procedure were encouraged and answered. The patient understands and consents to the procedure. Limited axial scans through the pelvis were obtained. The pelvic fluid was localized and an appropriate skin entry site was determined and marked. The operative field was prepped with chlorhexidinein a sterile fashion, and a sterile drape was applied covering the operative field. A sterile gown and sterile gloves were used for the procedure. Local anesthesia was provided with 1% Lidocaine. Under CT fluoroscopic guidance, a 15 cm Yueh multi  side hole sheath needle was advanced into the pelvic fluid collection. 150 mL clear yellow fluid aspirated and sent for the requested laboratory studies. Follow-up scan shows no hemorrhage or other apparent complication. The patient tolerated the procedure well. COMPLICATIONS: None immediate FINDINGS: Moderate pelvic ascites was again localized. Paracentesis  performed under CT guidance, sent for requested laboratory studies as above. IMPRESSION: 1. Technically successful CT-guided pelvic paracentesis. Electronically Signed   By: Lucrezia Europe M.D.   On: 06/03/2017 14:55    Assessment & Plan:   # 47 year old female patient history of stage III cervical cancer-currently admitted to the hospital for worsening abdominal pain noted to have pelvic ascites  # Pelvic ascites moderate to large-question etiology-inflammatory versus malignant [remote history of cervical cancer].   Status post CT-guided aspiration-slight improvement of the pain/distention.Marland Kitchen  Awaiting on the fluid studies.  Awaiting on tumor markers. Discussed with Dr.Gouru.   # Cervical cancer stage III [as per history; 2003]-treated by chemoradiation.  It is unclear if pelvic ascites is related to recurrence of her cervical cancer [statistically less likely; but still possible].  Await fluid studies.  Will discuss with Dr. Fransisca Connors tomorrow.   # CKD-with worsening renal function; question etiology.   Reviewed nephrology recommendations.  #Intermittent nausea-question hydrocodone versus others.  Recommend holding off hydrocodone discussed with the patient.  Cammie Sickle, MD 06/03/2017  6:25 PM

## 2017-06-03 NOTE — Consult Note (Signed)
Cynthia Antigua, MD [address] [place], [place], [place], [address] [address] [place], [place], [place], [address] Phone: [phone]  Fax: [phone]  Consultation  Referring Provider:     Dr. Margaretmary Eddy Primary Care Physician:  Dayton Martes Victoriano Lain, NP Primary Gastroenterologist:  Virgel Manifold, MD        Reason for Consultation:     Ascites  Date of Admission:  06/01/2017 Date of Consultation:  06/03/2017         HPI:   Cynthia Mccarthy is a 47 y.o. female admitted with b/l lower quadrant pain that started 3 days ago, 10/10 dull pain, not associated with any altered bowel habits, N/V or blood in stool.  Reports associated burning sensation in the suprapubic region.  Has history of neurogenic bladder and requires multiple daily self caths.  Reports that she went to urgent care and was told that her urine had blood.  And then was sent to [facility].  Her CT showed moderate to large volume pelvic ascites of unknown etiology GI was consulted.  No focal hepatic abnormality was reported.  No focal abnormality or duct dilation in the pancreas was reported.  No CT evidence for acute cholecystitis.  Cholelithiasis was noted.  No biliary duct dilation reported.  Urinary bladder was reported to be unremarkable.  The study was without contrast.  Patient was sent for paracentesis but only a small amount of ascites was identified and now safely approachable pocket for paracentesis was noted.  Patient denies any hepatotoxic drugs.  Patient denies any previous history of known cirrhosis.  Patient denies any previous history or recent history of alcohol use.  Patient reports a history of colonoscopy 20 years ago and states this was around the time of her cervical cancer.  No previous history of EGDs.  Past Medical History:  Diagnosis Date  . Anxiety   . Cervical cancer ([place])   . Depression   . DVT (deep venous thrombosis) ([place])   . Hormone disorder   . Hypertension   .  Hypothyroidism     Past Surgical History:  Procedure Laterality Date  . ABDOMINAL HYSTERECTOMY      Prior to Admission medications   Medication Sig Start Date End Date Taking? Authorizing Provider  busPIRone (BUSPAR) 15 MG tablet Take 15 mg by mouth daily.  01/29/17  Yes [provider]  cetirizine (ZYRTEC) 10 MG tablet Take 10 mg by mouth daily.    Yes [provider]  Cholecalciferol (VITAMIN D3) 5000 units TABS Take 1 tablet by mouth daily.    Yes [provider]  Cranberry 1000 MG CAPS Take 1 capsule by mouth daily.    Yes [provider]  enalapril (VASOTEC) 5 MG tablet Take 5 mg by mouth daily.  01/21/17  Yes [provider]  levothyroxine (SYNTHROID, LEVOTHROID) 88 MCG tablet Take 88 mcg by mouth daily before breakfast.  02/05/17  Yes [provider]  mirabegron ER (MYRBETRIQ) 50 MG TB24 tablet Take 1 tablet (50 mg total) by mouth daily. 04/21/17  Yes MacDiarmid, Nicki Reaper, MD  Probiotic Product (ALOE 22979 & PROBIOTICS PO) Take 1 capsule by mouth daily.    Yes [provider]  sertraline (ZOLOFT) 100 MG tablet Take 100 mg by mouth daily.    Yes [provider]  traZODone (DESYREL) 50 MG tablet Take 1-2 tablets by mouth at bedtime.   Yes [provider]  trimethoprim (TRIMPEX) 100 MG tablet Take 1 tablet (100 mg total)  by mouth daily. 03/12/17  Yes MacDiarmid, Nicki Reaper, MD  Catheters (INTERMITTENT 14FR/40CM) MISC Use for urinary self-catheterization 3 times a day as directed 09/21/14   [provider]  fluticasone (FLONASE) 50 MCG/ACT nasal spray Place 2 sprays into both nostrils daily.     [provider]  nitrofurantoin, macrocrystal-monohydrate, (MACROBID) 100 MG capsule Take 1 capsule (100 mg total) by mouth 2 (two) times daily. Patient not taking: Reported on 06/01/2017 06/01/17   [name], DO    Family History  Problem Relation Age of Onset  . Bladder Cancer Neg Hx   . Kidney cancer Neg  Hx      Social History   Tobacco Use  . Smoking status: Never Smoker  . Smokeless tobacco: Never Used  Substance Use Topics  . Alcohol use: Yes  . Drug use: No    Allergies as of 06/01/2017 - Review Complete 06/01/2017  Allergen Reaction Noted  . Penicillins Rash   . Sulfa antibiotics Hives and Rash 06/07/2014    Review of Systems:    All systems reviewed and negative except where noted in HPI.   Physical Exam:  Vital signs in last 24 hours: Vitals:   06/01/17 1949 06/02/17 0434 06/02/17 1952 06/03/17 0420  BP: (!) 157/65 (!) 92/52 (!) 152/76 121/72  Pulse: 64 76 85 84  Resp: 13 13 14 15   Temp: 98.6 F (37 C) 97.9 F (36.6 C) 98 F (36.7 C) 98.4 F (36.9 C)  TempSrc: Oral Oral Oral Oral  SpO2: 97% 95% 97% 97%  Weight:      Height:       Last BM Date: 06/01/17 General:   Pleasant, cooperative in NAD Head:  Normocephalic and atraumatic. Eyes:   No icterus.   Conjunctiva pink. PERRLA. Ears:  Normal auditory acuity. Neck:  Supple; no masses or thyroidomegaly Lungs: Respirations even and unlabored. Lungs clear to auscultation bilaterally.   No wheezes, crackles, or rhonchi.  Heart:  Regular rate and rhythm;  Without murmur, clicks, rubs or gallops Abdomen:  Soft, nondistended, nontender. Normal bowel sounds. No appreciable masses or hepatomegaly.  No rebound or guarding.  Neurologic:  Alert and oriented x3;  grossly normal neurologically. Skin:  Intact without significant lesions or rashes. Cervical Nodes:  No significant cervical adenopathy. Psych:  Alert and cooperative. Normal affect.  LAB RESULTS: Recent Labs    06/01/17 1006 06/02/17 0340 06/03/17 0500  WBC 18.8* 13.4* 14.2*  HGB 14.8 13.0 13.6  HCT 45.5 39.7 41.4  PLT 412 334 346   BMET Recent Labs    06/02/17 0340 06/02/17 1557 06/03/17 0500  NA 139 139 136  K 3.9 3.9 3.9  CL 109 109 107  CO2 23 19* 19*  GLUCOSE 118* 133* 131*  BUN 35* 38* 40*  CREATININE 1.50* 2.13* 2.77*  CALCIUM 8.0*  8.4* 8.6*   LFT Recent Labs    06/03/17 0500  PROT 7.2  ALBUMIN 3.8  AST 17  ALT 13*  ALKPHOS 37*  BILITOT 0.5   PT/INR No results for input(s): LABPROT, INR in the last 72 hours.  STUDIES: US Abdomen Limited  Result Date: 06/02/2017 CLINICAL DATA:  Ascites visualized in the pelvis by CT. EXAM: LIMITED ABDOMEN ULTRASOUND FOR ASCITES TECHNIQUE: Limited ultrasound survey for ascites was performed in all four abdominal quadrants. COMPARISON:  CT of the abdomen and pelvis on 06/01/2017 FINDINGS: By ultrasound, there is only a small amount of ascites in the right lower quadrant and adjacent to the liver. An  adequate pocket of ascites was not present to allow safe percutaneous approach for paracentesis today. Based on the CT, most of the free fluid is likely in the posterior pelvis when the patient is in a supine position. IMPRESSION: Only a small amount of ascites is identified by ultrasound with no approachable pocket for safe paracentesis. Electronically Signed   By: Aletta Edouard M.D.   On: 06/02/2017 11:35      Impression / Plan:   Cynthia Mccarthy is a 47 y.o. y/o female with suprapubic and bilateral lower quadrant abdominal pain for 3 days was reported hematuria noted at urgent care evaluation yesterday, with history of neurogenic bladder requiring self cath every day, and CT showing pelvic ascites  Clinical history and imaging findings are not consistent with cirrhosis Patient's liver enzymes are normal including normal liver function tests which include bilirubin and albumin. Pelvic ascites would need to be evaluated for cytology to rule out malignancy given her previous history of cervical cancer.  Oncology has evaluated the patient and CT-guided paracentesis is planned for today.  would recommend following those results to determine etiology of her pelvic ascites. Would not recommend further liver workup at this time given no evidence of elevated liver enzymes, or biliary  obstruction or cirrhosis. Can consider urology evaluation for hematuria which could be due to traumatic self cath versus malignancy  Patient's bowel movements alternate between loose stools and constipation.  This has not changed, she denies any blood in stool.  No further workup needed for her bowel movements at this time, unless symptoms change.  CT showed bowel to be unremarkable.  Thank you for involving me in the care of this patient.      LOS: 2 days   Virgel Manifold, MD  06/03/2017, 12:34 PM

## 2017-06-03 NOTE — Progress Notes (Signed)
[place] [facility] [place] NAME: Cynthia Mccarthy    MR#:  [id number]  DATE OF BIRTH:  10/25/1970  SUBJECTIVE:  CHIEF COMPLAINT: Patient's still reporting abdominal pain which is generalized.  REVIEW OF SYSTEMS:  CONSTITUTIONAL: No fever, fatigue or weakness.  EYES: No blurred or double vision.  EARS, NOSE, AND THROAT: No tinnitus or ear pain.  RESPIRATORY: No cough, shortness of breath, wheezing or hemoptysis.  CARDIOVASCULAR: No chest pain, orthopnea, edema.  GASTROINTESTINAL: Patient is reporting nausea, denies v vomiting, diarrhea  Has generalized abdominal pain  And bloating GENITOURINARY: No dysuria, hematuria.  ENDOCRINE: No polyuria, nocturia,  HEMATOLOGY: No anemia, easy bruising or bleeding SKIN: No rash or lesion. MUSCULOSKELETAL: No joint pain or arthritis.   NEUROLOGIC: No tingling, numbness, weakness.  PSYCHIATRY: No anxiety or depression.   DRUG ALLERGIES:   Allergies  Allergen Reactions  . Penicillins Rash    Has patient had a PCN reaction causing immediate rash, facial/tongue/throat swelling, SOB or lightheadedness with hypotension: Yes Has patient had a PCN reaction causing severe rash involving mucus membranes or skin necrosis: No Has patient had a PCN reaction that required hospitalization: No Has patient had a PCN reaction occurring within the last 10 years: Yes If all of the above answers are "NO", then may proceed with Cephalosporin use.  . Sulfa Antibiotics Hives and Rash    VITALS:  Blood pressure 102/61, pulse 87, temperature 98.2 F (36.8 C), temperature source Oral, resp. rate 20, height 5\' 2"  (1.575 m), weight 79.4 kg (175 lb), SpO2 96 %.  PHYSICAL EXAMINATION:  GENERAL:  47 y.o.-year-old patient lying in the bed with no acute distress.  EYES: Pupils equal, round, reactive to light and accommodation. No scleral icterus. Extraocular muscles intact.  HEENT: Head atraumatic, normocephalic. Oropharynx and  nasopharynx clear.  NECK:  Supple, no jugular venous distention. No thyroid enlargement, no tenderness.  LUNGS: Normal breath sounds bilaterally, no wheezing, rales,rhonchi or crepitation. No use of accessory muscles of respiration.  CARDIOVASCULAR: S1, S2 normal. No murmurs, rubs, or gallops.  ABDOMEN: Soft, generalized tenderness is present but no rebound tenderness, nondistended. Bowel sounds present.  EXTREMITIES: No pedal edema, cyanosis, or clubbing.  NEUROLOGIC: Cranial nerves II through XII are intact. Muscle strength 5/5 in all extremities. Sensation intact. Gait not checked.  PSYCHIATRIC: The patient is alert and oriented x 3.  SKIN: No obvious rash, lesion, or ulcer.    LABORATORY PANEL:   CBC Recent Labs  Lab 06/03/17 0500  WBC 14.2*  HGB 13.6  HCT 41.4  PLT 346   ------------------------------------------------------------------------------------------------------------------  Chemistries  Recent Labs  Lab 06/03/17 0500  NA 136  K 3.9  CL 107  CO2 19*  GLUCOSE 131*  BUN 40*  CREATININE 2.77*  CALCIUM 8.6*  AST 17  ALT 13*  ALKPHOS 37*  BILITOT 0.5   ------------------------------------------------------------------------------------------------------------------  Cardiac Enzymes No results for input(s): TROPONINI in the last 168 hours. ------------------------------------------------------------------------------------------------------------------  RADIOLOGY:  US Abdomen Limited  Result Date: 06/02/2017 CLINICAL DATA:  Ascites visualized in the pelvis by CT. EXAM: LIMITED ABDOMEN ULTRASOUND FOR ASCITES TECHNIQUE: Limited ultrasound survey for ascites was performed in all four abdominal quadrants. COMPARISON:  CT of the abdomen and pelvis on 06/01/2017 FINDINGS: By ultrasound, there is only a small amount of ascites in the right lower quadrant and adjacent to the liver. An adequate pocket of ascites was not present to allow safe percutaneous approach for  paracentesis today. Based on the CT, most  of the free fluid is likely in the posterior pelvis when the patient is in a supine position. IMPRESSION: Only a small amount of ascites is identified by ultrasound with no approachable pocket for safe paracentesis. Electronically Signed   By: Aletta Edouard M.D.   On: 06/02/2017 11:35   Ct Image Guided Drainage By Percutaneous Catheter  Result Date: 06/03/2017 CLINICAL DATA:  Abdominal pain. History of cervical carcinoma. CT demonstrates excess pelvic ascites. This seemed inaccessible on ultrasound. CT guided paracentesis requested. EXAM: CT GUIDED ASPIRATION BIOPSY OF PELVIC FLUID ANESTHESIA/SEDATION: Lidocaine 1% subcutaneous PROCEDURE: The procedure risks, benefits, and alternatives were explained to the patient. Questions regarding the procedure were encouraged and answered. The patient understands and consents to the procedure. Limited axial scans through the pelvis were obtained. The pelvic fluid was localized and an appropriate skin entry site was determined and marked. The operative field was prepped with chlorhexidinein a sterile fashion, and a sterile drape was applied covering the operative field. A sterile gown and sterile gloves were used for the procedure. Local anesthesia was provided with 1% Lidocaine. Under CT fluoroscopic guidance, a 15 cm Yueh multi side hole sheath needle was advanced into the pelvic fluid collection. 150 mL clear yellow fluid aspirated and sent for the requested laboratory studies. Follow-up scan shows no hemorrhage or other apparent complication. The patient tolerated the procedure well. COMPLICATIONS: None immediate FINDINGS: Moderate pelvic ascites was again localized. Paracentesis performed under CT guidance, sent for requested laboratory studies as above. IMPRESSION: 1. Technically successful CT-guided pelvic paracentesis. Electronically Signed   By: Lucrezia Europe M.D.   On: 06/03/2017 14:55    EKG:  No orders found for this  or any previous visit.  ASSESSMENT AND PLAN:   #Acute generalized abdominal pain-unclear etiology Pain management as needed GI consult and oncology consult appreciated ppi Gentle hydration with IV fluids CEA pending  #Acute kidney injury--with history of neurogenic bladder secondary to poor p.o. intake , prerenal Admitting creatinine is 1.60 baseline creatinine unknown today's creatinine 2.77 Hydrate with IV fluids and avoid nephrotoxins Monitor renal function closely Nephrology following  noticed  hematuria 3 days ago-outpatient urology follow-up  intermittent Self-Cath as needed   # Ascites, unclear etiology. CT-guided paracentesis was done today   Need to rule out infection or malignancy. CT abdomen and pelvis has revealed moderate to large volume pelvic ascites but unfortunately paracentesis cannot be done by the interventional radiology as there is no enough volume.  Cholelithiasis but no acute cholecystitis according to the CT  #Leukocytosis, unclear etiology,  Rocephin and Flagyl, follow-up CBC.  # History of cervical cancer, status post surgery, radiation and chemotherapy in 2003. Cholelithiasis. No CT evidence for acute cholecystitis       All the records are reviewed and case discussed with Care Management/Social Workerr. Management plans discussed with the patient, family and they are in agreement.  CODE STATUS: fc   TOTAL TIME TAKING CARE OF THIS PATIENT: 45  minutes.   POSSIBLE D/C IN 1-2 DAYS, DEPENDING ON CLINICAL CONDITION.  Note: This dictation was prepared with Dragon dictation along with smaller phrase technology. Any transcriptional errors that result from this process are unintentional.   Nicholes Mango M.D on 06/03/2017 at 4:42 PM  Between 7am to 6pm - Pager - [phone] After 6pm go to [email or web address] - password [email or web address] [facility]  Office  [phone]  CC: Primary care physician; Sallee Lange, NP

## 2017-06-03 NOTE — Progress Notes (Signed)
MEDICATION-RELATED CONSULT NOTE   IR Procedure Consult - Anticoagulant/Antiplatelet PTA/Inpatient Med List Review by Pharmacist    Procedure: paracentesis     Completed: 1/15 1343  Post-Procedural bleeding risk per IR MD assessment:  Low  Antithrombotic medications on inpatient or PTA profile prior to procedure:   Heparin 5000 units subcutaneous q8h    Recommended restart time per IR Post-Procedure Guidelines:  Day 0 (at least 4 hours or at next standard dose interval)   Other considerations:   Hgb 13.6,  Plt 346   Plan:    Restart Heparin 500 units subQ q8h as ordered by MD at 2200.   Chinita Greenland PharmD Clinical Pharmacist 06/03/2017

## 2017-06-03 NOTE — Consult Note (Signed)
Date: 06/03/2017                  Patient Name:  Cynthia Mccarthy  MRN: [id number]  DOB: 13-Jan-1971  Age / Sex: 47 y.o., female         PCP: Dayton Martes Victoriano Lain, NP                 Service Requesting Consult: IM/ Nicholes Mango, MD                 Reason for Consult: ARF            History of Present Illness: Patient is a 47 y.o. female with medical problems of hypertension, hypothyroidism, neurogenic bladder, doing self catheterizations 4 x per day,  hx of cervical cancer treated with chemo and radiation 2003, previous hysterectomy who was admitted to [facility] on 06/01/2017 for evaluation of lower abdominal pain with nausea.   The patient reports that she has had episodes of vomiting one day day prior to admission. She denies any blood in her stool and weight loss. She self catheterizes at home approximately 4 times daily and is able to urinate on her own as well. She had a frequent UTI, with most recent one at the end of 2018.   Patient presents with sudden onset of intense lower abdominal pain that started yesterday. She reports that this does not feel like previous UTI's.  Family is at the bedside. Patient underwent needle guided aspiration of pelvic fluid, which she reported has made her lower abdominal pain better.     No c/o leg swelling No fevers or chills She reports she had a good BM while in the hospital  She was started on Myrbetriq and Trimethoprim by urology about a month ago   Medications: Outpatient medications: Medications Prior to Admission  Medication Sig Dispense Refill Last Dose  . busPIRone (BUSPAR) 15 MG tablet Take 15 mg by mouth daily.    05/31/2017 at Unknown time  . cetirizine (ZYRTEC) 10 MG tablet Take 10 mg by mouth daily.    05/31/2017 at Unknown time  . Cholecalciferol (VITAMIN D3) 5000 units TABS Take 1 tablet by mouth daily.    05/31/2017 at Unknown time  . Cranberry 1000 MG CAPS Take 1 capsule by mouth daily.    05/31/2017 at Unknown time  . enalapril  (VASOTEC) 5 MG tablet Take 5 mg by mouth daily.    05/31/2017 at Unknown time  . levothyroxine (SYNTHROID, LEVOTHROID) 88 MCG tablet Take 88 mcg by mouth daily before breakfast.    05/31/2017 at Unknown time  . mirabegron ER (MYRBETRIQ) 50 MG TB24 tablet Take 1 tablet (50 mg total) by mouth daily. 30 tablet 11 05/31/2017 at Unknown time  . Probiotic Product (ALOE 50932 & PROBIOTICS PO) Take 1 capsule by mouth daily.    05/31/2017 at Unknown time  . sertraline (ZOLOFT) 100 MG tablet Take 100 mg by mouth daily.    05/31/2017 at Unknown time  . traZODone (DESYREL) 50 MG tablet Take 1-2 tablets by mouth at bedtime.   05/31/2017 at Unknown time  . trimethoprim (TRIMPEX) 100 MG tablet Take 1 tablet (100 mg total) by mouth daily. 30 tablet 11 05/31/2017 at Unknown time  . Catheters (INTERMITTENT 14FR/40CM) MISC Use for urinary self-catheterization 3 times a day as directed   Taking  . fluticasone (FLONASE) 50 MCG/ACT nasal spray Place 2 sprays into both nostrils daily.    prn at prn  . nitrofurantoin, macrocrystal-monohydrate, (  MACROBID) 100 MG capsule Take 1 capsule (100 mg total) by mouth 2 (two) times daily. (Patient not taking: Reported on 06/01/2017) 14 capsule 0 Not Taking at Unknown time    Current medications: Current Facility-Administered Medications  Medication Dose Route Frequency Provider Last Rate Last Dose  . 0.9 %  sodium chloride infusion  250 mL Intravenous PRN Demetrios Loll, MD      . acetaminophen (TYLENOL) tablet 650 mg  650 mg Oral Q6H PRN Demetrios Loll, MD       Or  . acetaminophen (TYLENOL) suppository 650 mg  650 mg Rectal Q6H PRN Demetrios Loll, MD      . albuterol (PROVENTIL) (2.5 MG/3ML) 0.083% nebulizer solution 2.5 mg  2.5 mg Nebulization Q2H PRN Demetrios Loll, MD      . bisacodyl (DULCOLAX) EC tablet 5 mg  5 mg Oral Daily PRN Demetrios Loll, MD      . busPIRone (BUSPAR) tablet 15 mg  15 mg Oral Daily Demetrios Loll, MD   15 mg at 06/03/17 0919  . cefTRIAXone (ROCEPHIN) 2 g in dextrose 5 % 50 mL IVPB   2 g Intravenous Q24H Pernell Dupre, RPH   Stopped at 06/02/17 1439  . fluticasone (FLONASE) 50 MCG/ACT nasal spray 2 spray  2 spray Each Nare Daily Demetrios Loll, MD   2 spray at 06/02/17 0807  . heparin injection 5,000 Units  5,000 Units Subcutaneous Q8H Ardis Rowan, PA-C      . HYDROcodone-acetaminophen (NORCO/VICODIN) 5-325 MG per tablet 1-2 tablet  1-2 tablet Oral Q4H PRN Demetrios Loll, MD   2 tablet at 06/03/17 0919  . HYDROmorphone (DILAUDID) injection 0.5 mg  0.5 mg Intravenous Q4H PRN Gouru, Aruna, MD   0.5 mg at 06/03/17 1158  . levothyroxine (SYNTHROID, LEVOTHROID) tablet 88 mcg  88 mcg Oral QAC breakfast Demetrios Loll, MD   88 mcg at 06/03/17 0919  . loratadine (CLARITIN) tablet 10 mg  10 mg Oral Daily Demetrios Loll, MD   10 mg at 06/03/17 0919  . metroNIDAZOLE (FLAGYL) IVPB 500 mg  500 mg Intravenous Q8H Demetrios Loll, MD   Stopped at 06/03/17 1256  . mirabegron ER (MYRBETRIQ) tablet 50 mg  50 mg Oral Daily Demetrios Loll, MD   50 mg at 06/03/17 0919  . ondansetron (ZOFRAN) tablet 4 mg  4 mg Oral Q6H PRN Demetrios Loll, MD       Or  . ondansetron [facility]) injection 4 mg  4 mg Intravenous Q6H PRN Demetrios Loll, MD   4 mg at 06/02/17 1734  . senna-docusate (Senokot-S) tablet 1 tablet  1 tablet Oral QHS PRN Demetrios Loll, MD   1 tablet at 06/01/17 1611  . sertraline (ZOLOFT) tablet 100 mg  100 mg Oral Daily Demetrios Loll, MD   100 mg at 06/03/17 0919  . sodium chloride flush (NS) 0.9 % injection 3 mL  3 mL Intravenous Q12H Demetrios Loll, MD   3 mL at 06/03/17 0920  . sodium chloride flush (NS) 0.9 % injection 3 mL  3 mL Intravenous PRN Demetrios Loll, MD      . traZODone (DESYREL) tablet 50-100 mg  50-100 mg Oral QHS Demetrios Loll, MD   100 mg at 06/02/17 2125      Allergies: Allergies  Allergen Reactions  . Penicillins Rash    Has patient had a PCN reaction causing immediate rash, facial/tongue/throat swelling, SOB or lightheadedness with hypotension: Yes Has patient had a PCN reaction causing severe rash  involving mucus membranes  or skin necrosis: No Has patient had a PCN reaction that required hospitalization: No Has patient had a PCN reaction occurring within the last 10 years: Yes If all of the above answers are "NO", then may proceed with Cephalosporin use.  . Sulfa Antibiotics Hives and Rash      Past Medical History: Past Medical History:  Diagnosis Date  . Anxiety   . Cervical cancer ([place])   . Depression   . DVT (deep venous thrombosis) ([place])   . Hormone disorder   . Hypertension   . Hypothyroidism      Past Surgical History: Past Surgical History:  Procedure Laterality Date  . ABDOMINAL HYSTERECTOMY       Family History: Family History  Problem Relation Age of Onset  . Bladder Cancer Neg Hx   . Kidney cancer Neg Hx      Social History: Social History   Socioeconomic History  . Marital status: Married    Spouse name: Not on file  . Number of children: Not on file  . Years of education: Not on file  . Highest education level: Not on file  Social Needs  . Financial resource strain: Not on file  . Food insecurity - worry: Not on file  . Food insecurity - inability: Not on file  . Transportation needs - medical: Not on file  . Transportation needs - non-medical: Not on file  Occupational History  . Not on file  Tobacco Use  . Smoking status: Never Smoker  . Smokeless tobacco: Never Used  Substance and Sexual Activity  . Alcohol use: Yes  . Drug use: No  . Sexual activity: Not on file  Other Topics Concern  . Not on file  Social History Narrative  . Not on file     Review of Systems: Gen: 'No fever, chills, weight loss  HEENT: No complaints  CV: No chest pain  Resp: No SOB  GI: Admits to alternating constipation and diarrhea, nausea, vomiting  GU : No dysuria  MS: No complaints  Derm:  No complaints  Psych: No complaints  Heme: Admits previous history of DVT  Neuro: No complaints  Endocrine: No complaints   Vital Signs: Blood  pressure 121/72, pulse 84, temperature 98.4 F (36.9 C), temperature source Oral, resp. rate 15, height 5\' 2"  (1.575 m), weight 79.4 kg (175 lb), SpO2 97 %.  No intake or output data in the 24 hours ending 06/03/17 1328  Weight trends: Filed Weights   06/01/17 1443  Weight: 79.4 kg (175 lb)    Physical Exam: General:  lying in bed. Appears mildly uncomfortable   HEENT Normocephalic, atraumatic   Neck:  supple   Lungs: Faint crackles in LLB   Heart::  regular rate/rhythm, no murmur   Abdomen: Soft. Lower abdominal tenderness   Extremities:  No edema   Neurologic: Alert and oriented   Skin: Intact              Lab results: Basic Metabolic Panel: Recent Labs  Lab 06/02/17 0340 06/02/17 1557 06/03/17 0500  NA 139 139 136  K 3.9 3.9 3.9  CL 109 109 107  CO2 23 19* 19*  GLUCOSE 118* 133* 131*  BUN 35* 38* 40*  CREATININE 1.50* 2.13* 2.77*  CALCIUM 8.0* 8.4* 8.6*    Liver Function Tests: Recent Labs  Lab 06/03/17 0500  AST 17  ALT 13*  ALKPHOS 37*  BILITOT 0.5  PROT 7.2  ALBUMIN 3.8   Recent Labs  Lab  06/01/17 1006  LIPASE 33   No results for input(s): AMMONIA in the last 168 hours.  CBC: Recent Labs  Lab 06/02/17 0340 06/03/17 0500  WBC 13.4* 14.2*  HGB 13.0 13.6  HCT 39.7 41.4  MCV 88.1 88.4  PLT 334 346    Cardiac Enzymes: No results for input(s): CKTOTAL, TROPONINI in the last 168 hours.  BNP: Invalid input(s): POCBNP  CBG: No results for input(s): GLUCAP in the last 168 hours.  Microbiology: Recent Results (from the past 720 hour(s))  Urine Culture     Status: None   Collection Time: 06/01/17  8:47 AM  Result Value Ref Range Status   Specimen Description   Final    URINE, CLEAN CATCH Performed at [facility], [address] [place], [place] [address]    Special Requests   Final    NONE Performed at [facility], [address] [place], [place] [address]    Culture   Final    NO GROWTH Performed  at [facility], [place] [address] [place], [place] [address]    Report Status 06/03/2017 FINAL  Final  Urine Culture     Status: Abnormal   Collection Time: 06/01/17 11:18 AM  Result Value Ref Range Status   Specimen Description   Final    URINE, RANDOM Performed at [facility], [address], [place], [place] [address]    Special Requests   Final    NONE Performed at [facility], [address] [place], [place] [address]    Culture (A)  Final    <10,000 COLONIES/mL INSIGNIFICANT GROWTH Performed at [facility], [place] [address] [place], [place] [address]    Report Status 06/02/2017 FINAL  Final     Coagulation Studies: No results for input(s): LABPROT, INR in the last 72 hours.  Urinalysis: Recent Labs    06/01/17 0847 06/01/17 1008  COLORURINE AMBER* YELLOW*  LABSPEC >1.030* 1.019  PHURINE 5.5 5.0  GLUCOSEU NEGATIVE NEGATIVE  HGBUR LARGE* MODERATE*  BILIRUBINUR NEGATIVE NEGATIVE  KETONESUR NEGATIVE NEGATIVE  PROTEINUR 100* NEGATIVE  NITRITE NEGATIVE NEGATIVE  LEUKOCYTESUR NEGATIVE TRACE*        Imaging: US Abdomen Limited  Result Date: 06/02/2017 CLINICAL DATA:  Ascites visualized in the pelvis by CT. EXAM: LIMITED ABDOMEN ULTRASOUND FOR ASCITES TECHNIQUE: Limited ultrasound survey for ascites was performed in all four abdominal quadrants. COMPARISON:  CT of the abdomen and pelvis on 06/01/2017 FINDINGS: By ultrasound, there is only a small amount of ascites in the right lower quadrant and adjacent to the liver. An adequate pocket of ascites was not present to allow safe percutaneous approach for paracentesis today. Based on the CT, most of the free fluid is likely in the posterior pelvis when the patient is in a supine position. IMPRESSION: Only a small amount of ascites is identified by ultrasound with no approachable pocket for safe paracentesis. Electronically Signed   By: Aletta Edouard M.D.   On: 06/02/2017 11:35       Assessment & Plan: Pt is a 47 y.o. Caucasian  female with medical problems of hypertension, hypothyroidism, neurogenic bladder requiring intermittent self caths, hx of cervical cancer in 2003 treated with chemo and radiation, previous hysterectomy who was admitted to [facility] on 06/01/2017 for evaluation of lower abdominal pain with nausea.   1.  Acute Renal failure. Baseline creatinine is unknown. Admit Cr is 1.63 Creatinine and BUN have increased since admission. Today's Cr is  2.77 Potentially intrinsic ATN due to volume shifts in the setting of taking ACE-I.  Initiate IV fluids for hydration.   Continue to monitor.  Electrolytes and Volume status are acceptable No acute indication for Dialysis at present   2. Pelvic ascites. Unknown etiology.  Paracentesis performed, awaiting results  Manage per hospitalist team.  CEA pending .  3. Neurogenic bladder with frequent UTIs Requires intermittent self caths

## 2017-06-03 NOTE — Plan of Care (Signed)
Pt with decreased appetite, she said it is because she feels full. Zofran given on day shift, pt has not required any tonight. Abdominal pain controlled with Dilaudid and Norco. Self cath. Numerous consults pending: nephrology, oncology and GI. Supportive spouse at bedside throughout the night.  Education: Knowledge of General Education information will improve 06/03/2017 0306 - Progressing by Jeffie Pollock, RN   Health Behavior/Discharge Planning: Ability to manage health-related needs will improve 06/03/2017 0306 - Progressing by Jeffie Pollock, RN   Clinical Measurements: Ability to maintain clinical measurements within normal limits will improve 06/03/2017 0306 - Progressing by Jeffie Pollock, RN Will remain free from infection 06/03/2017 0306 - Progressing by Jeffie Pollock, RN Diagnostic test results will improve 06/03/2017 0306 - Progressing by Jeffie Pollock, RN Respiratory complications will improve 06/03/2017 0306 - Progressing by Jeffie Pollock, RN Cardiovascular complication will be avoided 06/03/2017 0306 - Progressing by Jeffie Pollock, RN   Activity: Risk for activity intolerance will decrease 06/03/2017 0306 - Progressing by Jeffie Pollock, RN   Nutrition: Adequate nutrition will be maintained 06/03/2017 0306 - Not Progressing by Jeffie Pollock, RN   Coping: Level of anxiety will decrease 06/03/2017 0306 - Progressing by Jeffie Pollock, RN   Elimination: Will not experience complications related to bowel motility 06/03/2017 0306 - Progressing by Jeffie Pollock, RN Will not experience complications related to urinary retention 06/03/2017 0306 - Progressing by Jeffie Pollock, RN   Pain Managment: General experience of comfort will improve 06/03/2017 0306 - Progressing by Jeffie Pollock, RN   Pain Managment: General experience of comfort will improve 06/03/2017 0306 - Progressing by Jeffie Pollock, RN   Safety: Ability to remain free from injury will improve 06/03/2017 0306 - Progressing by Jeffie Pollock, RN   Skin Integrity: Risk for impaired skin integrity will decrease 06/03/2017 0306 - Progressing by Jeffie Pollock, RN

## 2017-06-03 NOTE — Procedures (Signed)
  Procedure: CT RLQ paracentesis 13ml clear yellow EBL:   minimal Complications:  none immediate  See full dictation in [employer].  Dillard Cannon MD Main # [phone] Pager  [phone]

## 2017-06-03 NOTE — Progress Notes (Signed)
Chief Complaint: Patient was seen in consultation today for  Chief Complaint  Patient presents with  . Abdominal Pain    Referring Physician(s): Gouru, Aruna  Supervising Physician: Arne Cleveland  Patient Status: [place] - In-pt  History of Present Illness: Cynthia Mccarthy is a 47 y.o. female with a known history of cervical cancer, DVT, hypertension and hypothyroidism.who presented to the ED /13/2019.  She was complaining of diffuse abdominal pain for the past 3 days. She stated it was mostly in her lower abdomen, and described it as intermittent burning and bloating.   She also complains of nausea, but no vomiting or diarrhea, no chills or fever, no dysuria or hematuria.    CT scan show moderate to large ascites. Cholelithiasis. No CT evidence for acute cholecystitis.   We are asked to aspirate the fluid for pathology/labs.  She has a sip of milk at 0830. She had subQ heparin at 0600.  Past Medical History:  Diagnosis Date  . Anxiety   . Cervical cancer ([place])   . Depression   . DVT (deep venous thrombosis) ([place])   . Hormone disorder   . Hypertension   . Hypothyroidism     Past Surgical History:  Procedure Laterality Date  . ABDOMINAL HYSTERECTOMY      Allergies: Penicillins and Sulfa antibiotics  Medications: Prior to Admission medications   Medication Sig Start Date End Date Taking? Authorizing Provider  busPIRone (BUSPAR) 15 MG tablet Take 15 mg by mouth daily.  01/29/17  Yes [provider]  cetirizine (ZYRTEC) 10 MG tablet Take 10 mg by mouth daily.    Yes [provider]  Cholecalciferol (VITAMIN D3) 5000 units TABS Take 1 tablet by mouth daily.    Yes [provider]  Cranberry 1000 MG CAPS Take 1 capsule by mouth daily.    Yes [provider]  enalapril (VASOTEC) 5 MG tablet Take 5 mg by mouth daily.  01/21/17  Yes [provider]  levothyroxine (SYNTHROID, LEVOTHROID) 88 MCG tablet Take 88 mcg by mouth  daily before breakfast.  02/05/17  Yes [provider]  mirabegron ER (MYRBETRIQ) 50 MG TB24 tablet Take 1 tablet (50 mg total) by mouth daily. 04/21/17  Yes MacDiarmid, Nicki Reaper, MD  Probiotic Product (ALOE 32951 & PROBIOTICS PO) Take 1 capsule by mouth daily.    Yes [provider]  sertraline (ZOLOFT) 100 MG tablet Take 100 mg by mouth daily.    Yes [provider]  traZODone (DESYREL) 50 MG tablet Take 1-2 tablets by mouth at bedtime.   Yes [provider]  trimethoprim (TRIMPEX) 100 MG tablet Take 1 tablet (100 mg total) by mouth daily. 03/12/17  Yes MacDiarmid, Nicki Reaper, MD  Catheters (INTERMITTENT 14FR/40CM) MISC Use for urinary self-catheterization 3 times a day as directed 09/21/14   [provider]  fluticasone (FLONASE) 50 MCG/ACT nasal spray Place 2 sprays into both nostrils daily.     [provider]  nitrofurantoin, macrocrystal-monohydrate, (MACROBID) 100 MG capsule Take 1 capsule (100 mg total) by mouth 2 (two) times daily. Patient not taking: Reported on 06/01/2017 06/01/17   [name], DO     Family History  Problem Relation Age of Onset  . Bladder Cancer Neg Hx   . Kidney cancer Neg Hx     Social History   Socioeconomic History  . Marital status: Married    Spouse name: None  . Number of children: None  . Years of education: None  . Highest  education level: None  Social Needs  . Financial resource strain: None  . Food insecurity - worry: None  . Food insecurity - inability: None  . Transportation needs - medical: None  . Transportation needs - non-medical: None  Occupational History  . None  Tobacco Use  . Smoking status: Never Smoker  . Smokeless tobacco: Never Used  Substance and Sexual Activity  . Alcohol use: Yes  . Drug use: No  . Sexual activity: None  Other Topics Concern  . None  Social History Narrative  . None     Review of Systems: A 12 point ROS discussed. Positives are in HPI, all others are  negative. Review of Systems  Vital Signs: BP 121/72 (BP Location: Left Arm)   Pulse 84   Temp 98.4 F (36.9 C) (Oral)   Resp 15   Ht 5\' 2"  (1.575 m)   Wt 175 lb (79.4 kg)   SpO2 97%   BMI 32.01 kg/m   Physical Exam  Constitutional: She is oriented to person, place, and time.  Obese, NAD  HENT:  Head: Normocephalic and atraumatic.  Eyes: EOM are normal.  Neck: Normal range of motion.  Cardiovascular: Normal rate, regular rhythm and normal heart sounds.  Pulmonary/Chest: Effort normal and breath sounds normal.  Abdominal: Soft. There is tenderness.  Musculoskeletal: Normal range of motion.  Neurological: She is alert and oriented to person, place, and time.  Skin: Skin is warm and dry.  Psychiatric: She has a normal mood and affect. Her behavior is normal. Judgment and thought content normal.  Vitals reviewed.   Imaging: Ct Abdomen Pelvis Wo Contrast  Result Date: 06/01/2017 CLINICAL DATA:  Bilateral lower abdominal pain EXAM: CT ABDOMEN AND PELVIS WITHOUT CONTRAST TECHNIQUE: Multidetector CT imaging of the abdomen and pelvis was performed following the standard protocol without IV contrast. COMPARISON:  Ultrasound 03/06/2017 FINDINGS: Lower chest: Lung bases are clear. No effusions. Heart is normal size. Hepatobiliary: 1.5 cm gallstone within the gallbladder. No focal hepatic abnormality. Pancreas: No focal abnormality or ductal dilatation. Spleen: No focal abnormality.  Normal size. Adrenals/Urinary Tract: No adrenal abnormality. No focal renal abnormality. No stones or hydronephrosis. Urinary bladder is unremarkable. Stomach/Bowel: Apparent prior appendectomy. Stomach, large and small bowel grossly unremarkable. Vascular/Lymphatic: No evidence of aneurysm or adenopathy. Reproductive: Prior hysterectomy.  No adnexal masses. Other: Moderate to large volume ascites noted in the pelvis. Small amount adjacent to the liver margin. No free air. Musculoskeletal: No acute bony abnormality.  IMPRESSION: Moderate to large volume pelvic ascites of unknown etiology. Cholelithiasis. No CT evidence for acute cholecystitis. No biliary ductal dilatation. Electronically Signed   By: Rolm Baptise M.D.   On: 06/01/2017 12:08   US Abdomen Limited  Result Date: 06/02/2017 CLINICAL DATA:  Ascites visualized in the pelvis by CT. EXAM: LIMITED ABDOMEN ULTRASOUND FOR ASCITES TECHNIQUE: Limited ultrasound survey for ascites was performed in all four abdominal quadrants. COMPARISON:  CT of the abdomen and pelvis on 06/01/2017 FINDINGS: By ultrasound, there is only a small amount of ascites in the right lower quadrant and adjacent to the liver. An adequate pocket of ascites was not present to allow safe percutaneous approach for paracentesis today. Based on the CT, most of the free fluid is likely in the posterior pelvis when the patient is in a supine position. IMPRESSION: Only a small amount of ascites is identified by ultrasound with no approachable pocket for safe paracentesis. Electronically Signed   By: Aletta Edouard M.D.   On: 06/02/2017  11:35    Labs:  CBC: Recent Labs    06/01/17 1006 06/02/17 0340 06/03/17 0500  WBC 18.8* 13.4* 14.2*  HGB 14.8 13.0 13.6  HCT 45.5 39.7 41.4  PLT 412 334 346    COAGS: No results for input(s): INR, APTT in the last 8760 hours.  BMP: Recent Labs    06/01/17 1006 06/02/17 0340 06/02/17 1557  NA 139 139 139  K 4.4 3.9 3.9  CL 107 109 109  CO2 22 23 19*  GLUCOSE 118* 118* 133*  BUN 36* 35* 38*  CALCIUM 9.7 8.0* 8.4*  CREATININE 1.63* 1.50* 2.13*  GFRNONAA 36* 40* 26*  GFRAA 42* 47* 30*    LIVER FUNCTION TESTS: Recent Labs    06/01/17 1006 06/02/17 1557  BILITOT 0.5 0.4  AST 22 18  ALT 18 13*  ALKPHOS 44 36*  PROT 7.6 7.0  ALBUMIN 4.3 3.8    TUMOR MARKERS: No results for input(s): AFPTM, CEA, CA199, CHROMGRNA in the last 8760 hours.  Assessment and Plan:  Abdominal pain, small amount of ascites, history of cervical  cancer.  Will proceed with image guided aspiration of the fluid today by Dr. Vernard Gambles.  Risks and benefits discussed with the patient including bleeding, infection, damage to adjacent structures, bowel perforation/fistula connection, and sepsis.  All of the patient's questions were answered, patient is agreeable to proceed. Consent signed and in chart.  Thank you for this interesting consult.  I greatly enjoyed meeting Cynthia Mccarthy and look forward to participating in their care.  A copy of this report was sent to the requesting provider on this date.  Electronically Signed: Murrell Redden, PA-C 06/03/2017, 8:37 AM   I spent a total of 40 Minutes in face to face in clinical consultation, greater than 50% of which was counseling/coordinating care for image guided aspiration of fluid.

## 2017-06-04 ENCOUNTER — Inpatient Hospital Stay: Payer: BC Managed Care – PPO

## 2017-06-04 LAB — CBC
HCT: 40.2 % (ref 35.0–47.0)
Hemoglobin: 13.2 g/dL (ref 12.0–16.0)
MCH: 29 pg (ref 26.0–34.0)
MCHC: 32.8 g/dL (ref 32.0–36.0)
MCV: 88.5 fL (ref 80.0–100.0)
PLATELETS: 369 10*3/uL (ref 150–440)
RBC: 4.54 MIL/uL (ref 3.80–5.20)
RDW: 14.2 % (ref 11.5–14.5)
WBC: 13 10*3/uL — ABNORMAL HIGH (ref 3.6–11.0)

## 2017-06-04 LAB — PROTEIN / CREATININE RATIO, URINE
CREATININE, URINE: 108 mg/dL
PROTEIN CREATININE RATIO: 2.01 mg/mg{creat} — AB (ref 0.00–0.15)
Total Protein, Urine: 217 mg/dL

## 2017-06-04 LAB — URINALYSIS, COMPLETE (UACMP) WITH MICROSCOPIC
Bacteria, UA: NONE SEEN
Bilirubin Urine: NEGATIVE
GLUCOSE, UA: NEGATIVE mg/dL
Ketones, ur: 5 mg/dL — AB
Nitrite: NEGATIVE
PH: 5 (ref 5.0–8.0)
Protein, ur: 100 mg/dL — AB
Specific Gravity, Urine: 1.017 (ref 1.005–1.030)

## 2017-06-04 LAB — BASIC METABOLIC PANEL
Anion gap: 12 (ref 5–15)
BUN: 48 mg/dL — AB (ref 6–20)
CHLORIDE: 107 mmol/L (ref 101–111)
CO2: 17 mmol/L — AB (ref 22–32)
CREATININE: 3.35 mg/dL — AB (ref 0.44–1.00)
Calcium: 8.4 mg/dL — ABNORMAL LOW (ref 8.9–10.3)
GFR calc Af Amer: 18 mL/min — ABNORMAL LOW (ref >60)
GFR calc non Af Amer: 15 mL/min — ABNORMAL LOW (ref >60)
Glucose, Bld: 121 mg/dL — ABNORMAL HIGH (ref 65–99)
Potassium: 4.2 mmol/L (ref 3.5–5.1)
SODIUM: 136 mmol/L (ref 135–145)

## 2017-06-04 LAB — CEA: CEA: 1.9 ng/mL (ref 0.0–4.7)

## 2017-06-04 LAB — CYTOLOGY - NON PAP

## 2017-06-04 LAB — CA 125: CANCER ANTIGEN (CA) 125: 41.3 U/mL — AB (ref 0.0–38.1)

## 2017-06-04 MED ORDER — ALUM & MAG HYDROXIDE-SIMETH 200-200-20 MG/5ML PO SUSP
15.0000 mL | Freq: Four times a day (QID) | ORAL | Status: DC | PRN
  Administered 2017-06-04: 15 mL via ORAL
  Filled 2017-06-04: qty 30

## 2017-06-04 MED ORDER — HYDROMORPHONE HCL 2 MG PO TABS
1.0000 mg | ORAL_TABLET | ORAL | Status: DC | PRN
  Administered 2017-06-04 – 2017-06-05 (×5): 1 mg via ORAL
  Filled 2017-06-04 (×5): qty 1

## 2017-06-04 NOTE — Plan of Care (Signed)
  Education: Knowledge of General Education information will improve 06/04/2017 0506 - Progressing by Jeffie Pollock, RN   Health Behavior/Discharge Planning: Ability to manage health-related needs will improve 06/04/2017 0506 - Progressing by Jeffie Pollock, RN   Clinical Measurements: Ability to maintain clinical measurements within normal limits will improve 06/04/2017 0506 - Progressing by Jeffie Pollock, RN Will remain free from infection 06/04/2017 0506 - Progressing by Jeffie Pollock, RN Diagnostic test results will improve 06/04/2017 0506 - Progressing by Jeffie Pollock, RN Respiratory complications will improve 06/04/2017 0506 - Progressing by Jeffie Pollock, RN Cardiovascular complication will be avoided 06/04/2017 0506 - Progressing by Jeffie Pollock, RN   Activity: Risk for activity intolerance will decrease 06/04/2017 0506 - Progressing by Jeffie Pollock, RN   Nutrition: Adequate nutrition will be maintained 06/04/2017 0506 - Not Progressing by Jeffie Pollock, RN   Coping: Level of anxiety will decrease 06/04/2017 0506 - Progressing by Jeffie Pollock, RN   Elimination: Will not experience complications related to bowel motility 06/04/2017 0506 - Progressing by Jeffie Pollock, RN Will not experience complications related to urinary retention 06/04/2017 0506 - Progressing by Jeffie Pollock, RN   Pain Managment: General experience of comfort will improve 06/04/2017 0506 - Progressing by Jeffie Pollock, RN   Safety: Ability to remain free from injury will improve 06/04/2017 0506 - Progressing by Jeffie Pollock, RN   Skin Integrity: Risk for impaired skin integrity will decrease 06/04/2017 0506 - Progressing by Jeffie Pollock, RN

## 2017-06-04 NOTE — Progress Notes (Addendum)
[place] [facility] [place] NAME: Cynthia Mccarthy    MR#:  [id number]  DATE OF BIRTH:  01-Aug-1970  SUBJECTIVE:  CHIEF COMPLAINT: Patient's reporting abdominal pain is slightly better today.  Reporting rusty urine  REVIEW OF SYSTEMS:  CONSTITUTIONAL: No fever, fatigue or weakness.  EYES: No blurred or double vision.  EARS, NOSE, AND THROAT: No tinnitus or ear pain.  RESPIRATORY: No cough, shortness of breath, wheezing or hemoptysis.  CARDIOVASCULAR: No chest pain, orthopnea, edema.  GASTROINTESTINAL: Patient is reporting nausea, denies v vomiting, diarrhea  Has generalized abdominal pain  And bloating GENITOURINARY: No dysuria, hematuria.  ENDOCRINE: No polyuria, nocturia,  HEMATOLOGY: No anemia, easy bruising or bleeding SKIN: No rash or lesion. MUSCULOSKELETAL: No joint pain or arthritis.   NEUROLOGIC: No tingling, numbness, weakness.  PSYCHIATRY: No anxiety or depression.   DRUG ALLERGIES:   Allergies  Allergen Reactions  . Penicillins Rash    Has patient had a PCN reaction causing immediate rash, facial/tongue/throat swelling, SOB or lightheadedness with hypotension: Yes Has patient had a PCN reaction causing severe rash involving mucus membranes or skin necrosis: No Has patient had a PCN reaction that required hospitalization: No Has patient had a PCN reaction occurring within the last 10 years: Yes If all of the above answers are "NO", then may proceed with Cephalosporin use.  . Sulfa Antibiotics Hives and Rash    VITALS:  Blood pressure 128/61, pulse 94, temperature 98.6 F (37 C), temperature source Oral, resp. rate 16, height 5\' 2"  (1.575 m), weight 79.4 kg (175 lb), SpO2 97 %.  PHYSICAL EXAMINATION:  GENERAL:  47 y.o.-year-old patient lying in the bed with no acute distress.  EYES: Pupils equal, round, reactive to light and accommodation. No scleral icterus. Extraocular muscles intact.  HEENT: Head atraumatic,  normocephalic. Oropharynx and nasopharynx clear.  NECK:  Supple, no jugular venous distention. No thyroid enlargement, no tenderness.  LUNGS: Normal breath sounds bilaterally, no wheezing, rales,rhonchi or crepitation. No use of accessory muscles of respiration.  CARDIOVASCULAR: S1, S2 normal. No murmurs, rubs, or gallops.  ABDOMEN: Soft, generalized tenderness is present but no rebound tenderness, nondistended. Bowel sounds present.  EXTREMITIES: No pedal edema, cyanosis, or clubbing.  NEUROLOGIC: Cranial nerves II through XII are intact. Muscle strength 5/5 in all extremities. Sensation intact. Gait not checked.  PSYCHIATRIC: The patient is alert and oriented x 3.  SKIN: No obvious rash, lesion, or ulcer.    LABORATORY PANEL:   CBC Recent Labs  Lab 06/04/17 0355  WBC 13.0*  HGB 13.2  HCT 40.2  PLT 369   ------------------------------------------------------------------------------------------------------------------  Chemistries  Recent Labs  Lab 06/03/17 0500 06/04/17 0355  NA 136 136  K 3.9 4.2  CL 107 107  CO2 19* 17*  GLUCOSE 131* 121*  BUN 40* 48*  CREATININE 2.77* 3.35*  CALCIUM 8.6* 8.4*  AST 17  --   ALT 13*  --   ALKPHOS 37*  --   BILITOT 0.5  --    ------------------------------------------------------------------------------------------------------------------  Cardiac Enzymes No results for input(s): TROPONINI in the last 168 hours. ------------------------------------------------------------------------------------------------------------------  RADIOLOGY:  US Renal  Result Date: 06/04/2017 CLINICAL DATA:  Acute kidney injury. EXAM: RENAL / URINARY TRACT ULTRASOUND COMPLETE COMPARISON:  Ultrasound of March 06, 2017. FINDINGS: Right Kidney: Length: 10.2 cm. Echogenicity within normal limits. No mass or hydronephrosis visualized. Left Kidney: Length: 11 cm. Echogenicity within normal limits. No mass or hydronephrosis visualized. Bladder: Appears  normal for degree of bladder  distention. Mild ascites is noted in the pelvis and right upper quadrant. IMPRESSION: Kidneys are unremarkable.  Mild ascites is noted. Electronically Signed   By: Marijo Conception, M.D.   On: 06/04/2017 09:59   Ct Image Guided Drainage By Percutaneous Catheter  Result Date: 06/03/2017 CLINICAL DATA:  Abdominal pain. History of cervical carcinoma. CT demonstrates excess pelvic ascites. This seemed inaccessible on ultrasound. CT guided paracentesis requested. EXAM: CT GUIDED ASPIRATION BIOPSY OF PELVIC FLUID ANESTHESIA/SEDATION: Lidocaine 1% subcutaneous PROCEDURE: The procedure risks, benefits, and alternatives were explained to the patient. Questions regarding the procedure were encouraged and answered. The patient understands and consents to the procedure. Limited axial scans through the pelvis were obtained. The pelvic fluid was localized and an appropriate skin entry site was determined and marked. The operative field was prepped with chlorhexidinein a sterile fashion, and a sterile drape was applied covering the operative field. A sterile gown and sterile gloves were used for the procedure. Local anesthesia was provided with 1% Lidocaine. Under CT fluoroscopic guidance, a 15 cm Yueh multi side hole sheath needle was advanced into the pelvic fluid collection. 150 mL clear yellow fluid aspirated and sent for the requested laboratory studies. Follow-up scan shows no hemorrhage or other apparent complication. The patient tolerated the procedure well. COMPLICATIONS: None immediate FINDINGS: Moderate pelvic ascites was again localized. Paracentesis performed under CT guidance, sent for requested laboratory studies as above. IMPRESSION: 1. Technically successful CT-guided pelvic paracentesis. Electronically Signed   By: Lucrezia Europe M.D.   On: 06/03/2017 14:55    EKG:  No orders found for this or any previous visit.  ASSESSMENT AND PLAN:   #Acute generalized abdominal pain-unclear  etiology Pain management as needed GI consult and oncology consult appreciated ppi Gentle hydration with IV fluids CEA 1.9  nml  #Acute kidney injury--with history of neurogenic bladder secondary to poor p.o. intake , prerenal Admitting creatinine is 1.60 baseline creatinine unknown today's creatinine 2.77--3.35 Hydrate with IV fluids and avoid nephrotoxins Monitor renal function closely Nephrology following  -Renal ultrasound with no acute abnormalities -We will get urine culture and sensitivity  as urinalysis is abnormal noticed  hematuria 3 days ago-outpatient urology follow-up  intermittent Self-Cath as needed   # Ascites, unclear etiology. CT-guided paracentesis was done 06/03/2017   Need to rule out infection or malignancy. -Follow-up with oncology Dr. Yevette Edwards CT abdomen and pelvis has revealed moderate to large volume pelvic ascites but unfortunately paracentesis cannot be done by the interventional radiology as there is no enough volume.  Cholelithiasis but no acute cholecystitis according to the CT  #Leukocytosis, unclear etiology,  Rocephin and Flagyl, follow-up CBC.  # History of cervical cancer, status post surgery, radiation and chemotherapy in 2003. Cholelithiasis. No CT evidence for acute cholecystitis       All the records are reviewed and case discussed with Care Management/Social Workerr. Management plans discussed with the patient, family and they are in agreement.  CODE STATUS: fc   TOTAL TIME TAKING CARE OF THIS PATIENT: 45  minutes.   POSSIBLE D/C IN 1-2 DAYS, DEPENDING ON CLINICAL CONDITION.  Note: This dictation was prepared with Dragon dictation along with smaller phrase technology. Any transcriptional errors that result from this process are unintentional.   Nicholes Mango M.D on 06/04/2017 at 3:09 PM  Between 7am to 6pm - Pager - [phone] After 6pm go to [email or web address] - password [email or web address] [facility]  Office   [phone]  CC: Primary care physician; Sallee Lange,  NP

## 2017-06-04 NOTE — Progress Notes (Signed)
Cynthia Mccarthy   DOB:November 18, 1970   UV#:[id number]    Subjective:  She notes to have slight improvement of the abdominal discomfort.  Is not complete resolved.  Continues to complain of intermittent nausea.  No vomiting.  Poor appetite.  Review system: No fever no chills.    Objective:  Vitals:   07-Jun-2017 0417 2017-06-07 1251  BP: 126/69 128/61  Pulse: 90 94  Resp: 15 16  Temp: 98.4 F (36.9 C) 98.6 F (37 C)  SpO2: 94% 97%     Intake/Output Summary (Last 24 hours) at 07-Jun-2017 1805 Last data filed at 2017/06/07 1639 Gross per 24 hour  Intake 951.25 ml  Output 300 ml  Net 651.25 ml    GENERAL: Well-nourished well-developed; Alert, no distress and comfortable.   With her family. EYES: no pallor or icterus OROPHARYNX: no thrush or ulceration. NECK: supple, no masses felt LYMPH:  no palpable lymphadenopathy in the cervical, axillary or inguinal regions LUNGS: decreased breath sounds to auscultation at bases and  No wheeze or crackles HEART/CVS: regular rate & rhythm and no murmurs; No lower extremity edema ABDOMEN: abdomen soft, tender on deep palpation.  And normal bowel sounds;  Musculoskeletal:no cyanosis of digits and no clubbing  PSYCH: alert & oriented x 3 with fluent speech NEURO: no focal motor/sensory deficits SKIN:  no rashes or significant lesions   Labs:  Lab Results  Component Value Date   WBC 13.0 (H) 06-07-17   HGB 13.2 06/07/17   HCT 40.2 Jun 07, 2017   MCV 88.5 06-07-2017   PLT 369 06-07-2017    Lab Results  Component Value Date   NA 136 06-07-2017   K 4.2 06/07/2017   CL 107 2017-06-07   CO2 17 (L) 2017/06/07    Studies:  US Renal  Result Date: 2017/06/07 CLINICAL DATA:  Acute kidney injury. EXAM: RENAL / URINARY TRACT ULTRASOUND COMPLETE COMPARISON:  Ultrasound of March 06, 2017. FINDINGS: Right Kidney: Length: 10.2 cm. Echogenicity within normal limits. No mass or hydronephrosis visualized. Left Kidney: Length: 11 cm. Echogenicity within  normal limits. No mass or hydronephrosis visualized. Bladder: Appears normal for degree of bladder distention. Mild ascites is noted in the pelvis and right upper quadrant. IMPRESSION: Kidneys are unremarkable.  Mild ascites is noted. Electronically Signed   By: Marijo Conception, M.D.   On: Jun 07, 2017 09:59   Ct Image Guided Drainage By Percutaneous Catheter  Result Date: 06/03/2017 CLINICAL DATA:  Abdominal pain. History of cervical carcinoma. CT demonstrates excess pelvic ascites. This seemed inaccessible on ultrasound. CT guided paracentesis requested. EXAM: CT GUIDED ASPIRATION BIOPSY OF PELVIC FLUID ANESTHESIA/SEDATION: Lidocaine 1% subcutaneous PROCEDURE: The procedure risks, benefits, and alternatives were explained to the patient. Questions regarding the procedure were encouraged and answered. The patient understands and consents to the procedure. Limited axial scans through the pelvis were obtained. The pelvic fluid was localized and an appropriate skin entry site was determined and marked. The operative field was prepped with chlorhexidinein a sterile fashion, and a sterile drape was applied covering the operative field. A sterile gown and sterile gloves were used for the procedure. Local anesthesia was provided with 1% Lidocaine. Under CT fluoroscopic guidance, a 15 cm Yueh multi side hole sheath needle was advanced into the pelvic fluid collection. 150 mL clear yellow fluid aspirated and sent for the requested laboratory studies. Follow-up scan shows no hemorrhage or other apparent complication. The patient tolerated the procedure well. COMPLICATIONS: None immediate FINDINGS: Moderate pelvic ascites was again localized. Paracentesis performed under  CT guidance, sent for requested laboratory studies as above. IMPRESSION: 1. Technically successful CT-guided pelvic paracentesis. Electronically Signed   By: Lucrezia Europe M.D.   On: 06/03/2017 14:55    Assessment & Plan:   # 47 year old female patient  history of stage III cervical cancer-currently admitted to the hospital for worsening abdominal pain noted to have pelvic ascites  # Pelvic ascites moderate to large-question etiology-inflammatory versus malignant [remote history of cervical cancer].   Status post CT-guided aspiration-slight improvement of the pain/distention.  No obvious infection noted on the fluid studies.  Fluid cytology pending.  # Cervical cancer stage III [as per history; 2003]-treated by chemoradiation.  It is unclear if pelvic ascites is related to recurrence of her cervical cancer [statistically less likely; but still possible].  Discussed with Dr. Fransisca Connors.  Await fluid cytology for now-patient will likely need laparoscopic evaluation after acute issues resolved.  # CKD-with worsening renal function; question etiology.   Ultrasound negative; nephrology following.  #Intermittent nausea-no vomiting.  Question medication versus others.  Recommend continued antiemetics.  Cammie Sickle, MD 06/04/2017  6:05 PM

## 2017-06-04 NOTE — Progress Notes (Signed)
Vonda Antigua, MD [address] [place], [place], [place], [address] [address], [place], [place], [place], [address] Phone: [phone]  Fax: [phone]   Subjective: Pt. S/p diagnostic CT guided paracentesis yesterday. Going for renal U/S today. No confusion. No n/v, altered bowel habits or acute changes   Objective: Vital signs in last 24 hours: Vitals:   06/03/17 0420 06/03/17 1508 06/03/17 1942 06/04/17 0417  BP: 121/72 102/61 123/63 126/69  Pulse: 84 87 87 90  Resp: 15 20 16 15   Temp: 98.4 F (36.9 C) 98.2 F (36.8 C) 98.7 F (37.1 C) 98.4 F (36.9 C)  TempSrc: Oral Oral Oral Oral  SpO2: 97% 96% 91% 94%  Weight:      Height:       Weight change:   Intake/Output Summary (Last 24 hours) at 06/04/2017 3570 Last data filed at 06/04/2017 0321 Gross per 24 hour  Intake 1233.75 ml  Output -  Net 1233.75 ml     Exam: Cardiac: +S1, +S2, RRR, No edema Pulm: CTA b/l, Normal Resp Effort Abd: Soft, NT/ND, No HSM Skin: Warm, no rashes Neck: Supple, Trachea midline   Lab Results: Labs reviewed Micro Results: Recent Results (from the past 240 hour(s))  Urine Culture     Status: None   Collection Time: 06/01/17  8:47 AM  Result Value Ref Range Status   Specimen Description   Final    URINE, CLEAN CATCH Performed at [facility], [address], [place], [place] [address]    Special Requests   Final    NONE Performed at [facility], [address] [place], [place] [address]    Culture   Final    NO GROWTH Performed at [facility], [place] [address], [place], [place] [address]    Report Status 06/03/2017 FINAL  Final  Urine Culture     Status: Abnormal   Collection Time: 06/01/17 11:18 AM  Result Value Ref Range Status   Specimen Description   Final    URINE, RANDOM Performed at [facility], [address] [place], [place] [address]    Special Requests   Final    NONE Performed at [facility], [address] [place], [place] [address]    Culture (A)  Final    <10,000 COLONIES/mL INSIGNIFICANT GROWTH Performed at [facility], [place] [address] [place], [place] [address]    Report Status 06/02/2017 FINAL  Final  Aerobic/Anaerobic Culture (surgical/deep wound)     Status: None (Preliminary result)   Collection Time: 06/03/17  1:35 PM  Result Value Ref Range Status   Specimen Description   Final    PERITONEAL Performed at [facility], [address] [place], [place] [address]    Special Requests   Final    NONE Performed at [facility], [address] [place], [place] [address]    Gram Stain   Final    FEW WBC PRESENT, PREDOMINANTLY MONONUCLEAR NO ORGANISMS SEEN Performed at [facility], [place] [address] [place], [place] [address]    Culture PENDING  Incomplete   Report Status PENDING  Incomplete   Studies/Results: US Abdomen Limited  Result Date: 06/02/2017 CLINICAL DATA:  Ascites visualized in the pelvis by CT. EXAM: LIMITED ABDOMEN ULTRASOUND FOR ASCITES TECHNIQUE: Limited ultrasound survey for ascites was performed in all four abdominal quadrants. COMPARISON:  CT of the abdomen and pelvis on 06/01/2017 FINDINGS: By ultrasound, there is  only a small amount of ascites in the right lower quadrant and adjacent to the liver. An adequate pocket of ascites was not present to allow safe percutaneous approach for paracentesis today. Based on the CT, most of the free fluid is likely in the posterior pelvis when the patient is in a supine position. IMPRESSION: Only a small amount of ascites is identified by ultrasound with no approachable pocket for safe paracentesis. Electronically Signed   By: Aletta Edouard M.D.   On: 06/02/2017 11:35   Ct Image Guided Drainage By Percutaneous Catheter  Result Date: 06/03/2017 CLINICAL DATA:  Abdominal pain. History of cervical carcinoma. CT demonstrates excess pelvic ascites. This seemed  inaccessible on ultrasound. CT guided paracentesis requested. EXAM: CT GUIDED ASPIRATION BIOPSY OF PELVIC FLUID ANESTHESIA/SEDATION: Lidocaine 1% subcutaneous PROCEDURE: The procedure risks, benefits, and alternatives were explained to the patient. Questions regarding the procedure were encouraged and answered. The patient understands and consents to the procedure. Limited axial scans through the pelvis were obtained. The pelvic fluid was localized and an appropriate skin entry site was determined and marked. The operative field was prepped with chlorhexidinein a sterile fashion, and a sterile drape was applied covering the operative field. A sterile gown and sterile gloves were used for the procedure. Local anesthesia was provided with 1% Lidocaine. Under CT fluoroscopic guidance, a 15 cm Yueh multi side hole sheath needle was advanced into the pelvic fluid collection. 150 mL clear yellow fluid aspirated and sent for the requested laboratory studies. Follow-up scan shows no hemorrhage or other apparent complication. The patient tolerated the procedure well. COMPLICATIONS: None immediate FINDINGS: Moderate pelvic ascites was again localized. Paracentesis performed under CT guidance, sent for requested laboratory studies as above. IMPRESSION: 1. Technically successful CT-guided pelvic paracentesis. Electronically Signed   By: Lucrezia Europe M.D.   On: 06/03/2017 14:55   Medications:  Scheduled Meds: . busPIRone  15 mg Oral Daily  . fluticasone  2 spray Each Nare Daily  . heparin  5,000 Units Subcutaneous Q8H  . levothyroxine  88 mcg Oral QAC breakfast  . loratadine  10 mg Oral Daily  . mirabegron ER  50 mg Oral Daily  . sertraline  100 mg Oral Daily  . sodium chloride flush  3 mL Intravenous Q12H  . traZODone  50-100 mg Oral QHS   Continuous Infusions: . sodium chloride    . sodium chloride 75 mL/hr at 06/04/17 0642  . cefTRIAXone (ROCEPHIN)  IV Stopped (06/03/17 1453)  . metronidazole Stopped (06/04/17  0453)   PRN Meds:.sodium chloride, acetaminophen **OR** acetaminophen, albuterol, bisacodyl, HYDROcodone-acetaminophen, HYDROmorphone (DILAUDID) injection, ondansetron **OR** ondansetron (ZOFRAN) IV, senna-docusate, sodium chloride flush   Assessment: Active Problems:   Ascites  47 year old female presented with suprapubic/bilateral lower quadrant abdominal discomfort and found to have pelvic ascites with remote history of cervical cancer in 2003 with no previous history of cirrhosis or no current liver abnormalities  Plan: Patient's abdominal pain improved after CT-guided paracentesis yesterday Pelvic ascites studies pending Oncology following No evidence of liver cirrhosis or previous history of abdominal ascites to point to liver dysfunction or cirrhosis as cause of her ascites. Normal platelets, and normal liver function tests point against cirrhosis.  I have added on labs to the fluid studies from yesterday Follow oncology recommendations. CA-125 is mildly elevated, but likely needs to be reviewed with clinical history and any previous CA-125 levels available. Would defer further testing for this to oncology. Nephrology following as creatinine is elevated on this admission and is  continuing to trend up. Follow up renal U/S     LOS: 3 days   Vonda Antigua, MD 06/04/2017, 8:22 AM

## 2017-06-04 NOTE — Progress Notes (Signed)
[facility], [place] 06/04/17  Subjective:   Patient continues to feel poorly.  She states there has not been any significant progression in her clinical condition She continues to feel nauseous No shortness of breath She is able to get up and go to the bathroom and do a self-catheterization She feels like urine output may have increased a little She is continued on maintenance IV fluids  Objective:  Vital signs in last 24 hours:  Temp:  [98.2 F (36.8 C)-98.7 F (37.1 C)] 98.6 F (37 C) (01/16 1251) Pulse Rate:  [87-94] 94 (01/16 1251) Resp:  [15-20] 16 (01/16 1251) BP: (102-128)/(61-69) 128/61 (01/16 1251) SpO2:  [91 %-97 %] 97 % (01/16 1251)  Weight change:  Filed Weights   06/01/17 1443  Weight: 79.4 kg (175 lb)    Intake/Output:    Intake/Output Summary (Last 24 hours) at 06/04/2017 1259 Last data filed at 06/04/2017 0321 Gross per 24 hour  Intake 1133.75 ml  Output -  Net 1133.75 ml     Physical Exam: General:  Ill-appearing, laying in the bed  HEENT  moist oral mucous membranes  Neck  supple  Pulm/lungs  normal breathing effort, clear to auscultation  CVS/Heart  regular rhythm, no rub or gallop  Abdomen:   Soft, nontender, nondistended  Extremities:  No peripheral edema  Neurologic:  Alert, oriented  Skin:  Normal turgor, no acute rashes          Basic Metabolic Panel:  Recent Labs  Lab 06/01/17 1006 06/02/17 0340 06/02/17 1557 06/03/17 0500 06/04/17 0355  NA 139 139 139 136 136  K 4.4 3.9 3.9 3.9 4.2  CL 107 109 109 107 107  CO2 22 23 19* 19* 17*  GLUCOSE 118* 118* 133* 131* 121*  BUN 36* 35* 38* 40* 48*  CREATININE 1.63* 1.50* 2.13* 2.77* 3.35*  CALCIUM 9.7 8.0* 8.4* 8.6* 8.4*     CBC: Recent Labs  Lab 06/01/17 1006 06/02/17 0340 06/03/17 0500 06/04/17 0355  WBC 18.8* 13.4* 14.2* 13.0*  HGB 14.8 13.0 13.6 13.2  HCT 45.5 39.7 41.4 40.2  MCV 87.4 88.1 88.4 88.5  PLT 412 334 346 369     No results  found for: HEPBSAG, HEPBSAB, HEPBIGM    Microbiology:  Recent Results (from the past 240 hour(s))  Urine Culture     Status: None   Collection Time: 06/01/17  8:47 AM  Result Value Ref Range Status   Specimen Description   Final    URINE, CLEAN CATCH Performed at [facility], [address] [place], [place] [address]    Special Requests   Final    NONE Performed at [facility], [address] [place], [place] [address]    Culture   Final    NO GROWTH Performed at [facility], [place] [address], [place], [place] [address]    Report Status 06/03/2017 FINAL  Final  Urine Culture     Status: Abnormal   Collection Time: 06/01/17 11:18 AM  Result Value Ref Range Status   Specimen Description   Final    URINE, RANDOM Performed at [facility], [address], [place], [place] [address]    Special Requests   Final    NONE Performed at [facility], [address] [place], [place] [address]    Culture (A)  Final    <10,000 COLONIES/mL INSIGNIFICANT GROWTH Performed at [facility], [place],  [place] [address]    Report Status 06/02/2017 FINAL  Final  Aerobic/Anaerobic Culture (surgical/deep wound)     Status: None (Preliminary result)   Collection Time: 06/03/17  1:35 PM  Result Value Ref Range Status   Specimen Description   Final    PERITONEAL Performed at [facility], [address], [place], [place] [address]    Special Requests   Final    NONE Performed at [facility], [address] [place], [place] [address]    Gram Stain   Final    FEW WBC PRESENT, PREDOMINANTLY MONONUCLEAR NO ORGANISMS SEEN Performed at [facility], [place] [address] [place], [place] [address]    Culture PENDING  Incomplete   Report Status PENDING  Incomplete    Coagulation Studies: No results for input(s): LABPROT, INR in the last 72 hours.  Urinalysis: Recent Labs    06/04/17 1230   COLORURINE YELLOW*  LABSPEC 1.017  PHURINE 5.0  GLUCOSEU NEGATIVE  HGBUR SMALL*  BILIRUBINUR NEGATIVE  KETONESUR 5*  PROTEINUR 100*  NITRITE NEGATIVE  LEUKOCYTESUR TRACE*      Imaging: US Renal  Result Date: 06/04/2017 CLINICAL DATA:  Acute kidney injury. EXAM: RENAL / URINARY TRACT ULTRASOUND COMPLETE COMPARISON:  Ultrasound of March 06, 2017. FINDINGS: Right Kidney: Length: 10.2 cm. Echogenicity within normal limits. No mass or hydronephrosis visualized. Left Kidney: Length: 11 cm. Echogenicity within normal limits. No mass or hydronephrosis visualized. Bladder: Appears normal for degree of bladder distention. Mild ascites is noted in the pelvis and right upper quadrant. IMPRESSION: Kidneys are unremarkable.  Mild ascites is noted. Electronically Signed   By: Marijo Conception, M.D.   On: 06/04/2017 09:59   Ct Image Guided Drainage By Percutaneous Catheter  Result Date: 06/03/2017 CLINICAL DATA:  Abdominal pain. History of cervical carcinoma. CT demonstrates excess pelvic ascites. This seemed inaccessible on ultrasound. CT guided paracentesis requested. EXAM: CT GUIDED ASPIRATION BIOPSY OF PELVIC FLUID ANESTHESIA/SEDATION: Lidocaine 1% subcutaneous PROCEDURE: The procedure risks, benefits, and alternatives were explained to the patient. Questions regarding the procedure were encouraged and answered. The patient understands and consents to the procedure. Limited axial scans through the pelvis were obtained. The pelvic fluid was localized and an appropriate skin entry site was determined and marked. The operative field was prepped with chlorhexidinein a sterile fashion, and a sterile drape was applied covering the operative field. A sterile gown and sterile gloves were used for the procedure. Local anesthesia was provided with 1% Lidocaine. Under CT fluoroscopic guidance, a 15 cm Yueh multi side hole sheath needle was advanced into the pelvic fluid collection. 150 mL clear yellow fluid  aspirated and sent for the requested laboratory studies. Follow-up scan shows no hemorrhage or other apparent complication. The patient tolerated the procedure well. COMPLICATIONS: None immediate FINDINGS: Moderate pelvic ascites was again localized. Paracentesis performed under CT guidance, sent for requested laboratory studies as above. IMPRESSION: 1. Technically successful CT-guided pelvic paracentesis. Electronically Signed   By: Lucrezia Europe M.D.   On: 06/03/2017 14:55     Medications:   . sodium chloride    . sodium chloride 75 mL/hr at 06/04/17 0642  . cefTRIAXone (ROCEPHIN)  IV Stopped (06/03/17 1453)  . metronidazole 500 mg (06/04/17 1133)   . busPIRone  15 mg Oral Daily  . fluticasone  2 spray Each Nare Daily  . heparin  5,000 Units Subcutaneous Q8H  . levothyroxine  88 mcg Oral QAC breakfast  . loratadine  10 mg Oral Daily  .  mirabegron ER  50 mg Oral Daily  . sertraline  100 mg Oral Daily  . sodium chloride flush  3 mL Intravenous Q12H  . traZODone  50-100 mg Oral QHS   sodium chloride, acetaminophen **OR** acetaminophen, albuterol, bisacodyl, HYDROcodone-acetaminophen, HYDROmorphone (DILAUDID) injection, ondansetron **OR** ondansetron (ZOFRAN) IV, senna-docusate, sodium chloride flush  Assessment/ Plan:  47 y.o. female with medical problems of hypertension, hypothyroidism, neurogenic bladder requiring intermittent self caths, hx of cervical cancer in 2003 treated with chemo and radiation, previous hysterectomy who was admitted to [facility] on 06/01/2017 for evaluation of lower abdominal pain with nausea.   1.  Acute Renal failure. Baseline creatinine is unknown. Admit Cr is 1.63 Creatinine and BUN have increased since admission. Today's Cr has further worsened to 3.35 . Urine output is not being closely monitored. Acute renal failure is likely secondary to ATN due to volume shifts in the setting of taking ACE-I.  Continue IV fluids for hydration.   Urinalysis shows protein.  We  will obtain urine protein to creatinine ratio Electrolytes and Volume status are acceptable No acute indication for Dialysis at present   2. Pelvic ascites. Unknown etiology.  Paracentesis performed, awaiting results  Manage per hospitalist and GI teams.   3. Neurogenic bladder with frequent UTIs Requires intermittent self caths     LOS: 3 Cynthia Mccarthy Cynthia Mccarthy 1/16/201912:59 [place], [place]

## 2017-06-04 NOTE — Plan of Care (Signed)
  Progressing Education: Knowledge of General Education information will improve 06/04/2017 1221 - Progressing by Cherylann Parr, RN Health Behavior/Discharge Planning: Ability to manage health-related needs will improve 06/04/2017 1221 - Progressing by Cherylann Parr, RN Clinical Measurements: Ability to maintain clinical measurements within normal limits will improve 06/04/2017 1221 - Progressing by Cherylann Parr, RN Will remain free from infection 06/04/2017 1221 - Progressing by Cherylann Parr, RN Diagnostic test results will improve 06/04/2017 1221 - Progressing by Cherylann Parr, RN Respiratory complications will improve 06/04/2017 1221 - Progressing by Cherylann Parr, RN Cardiovascular complication will be avoided 06/04/2017 1221 - Progressing by Cherylann Parr, RN Activity: Risk for activity intolerance will decrease 06/04/2017 1221 - Progressing by Cherylann Parr, RN Nutrition: Adequate nutrition will be maintained 06/04/2017 1221 - Progressing by Cherylann Parr, RN Coping: Level of anxiety will decrease 06/04/2017 1221 - Progressing by Cherylann Parr, RN Elimination: Will not experience complications related to bowel motility 06/04/2017 1221 - Progressing by Cherylann Parr, RN Will not experience complications related to urinary retention 06/04/2017 1221 - Progressing by Cherylann Parr, RN Pain Managment: General experience of comfort will improve 06/04/2017 1221 - Progressing by Cherylann Parr, RN Safety: Ability to remain free from injury will improve 06/04/2017 1221 - Progressing by Cherylann Parr, RN Skin Integrity: Risk for impaired skin integrity will decrease 06/04/2017 1221 - Progressing by Cherylann Parr, RN

## 2017-06-05 ENCOUNTER — Telehealth: Payer: Self-pay | Admitting: Internal Medicine

## 2017-06-05 LAB — BASIC METABOLIC PANEL
Anion gap: 10 (ref 5–15)
BUN: 50 mg/dL — AB (ref 6–20)
CHLORIDE: 107 mmol/L (ref 101–111)
CO2: 17 mmol/L — AB (ref 22–32)
CREATININE: 2.86 mg/dL — AB (ref 0.44–1.00)
Calcium: 8.4 mg/dL — ABNORMAL LOW (ref 8.9–10.3)
GFR calc Af Amer: 21 mL/min — ABNORMAL LOW (ref >60)
GFR calc non Af Amer: 18 mL/min — ABNORMAL LOW (ref >60)
GLUCOSE: 122 mg/dL — AB (ref 65–99)
Potassium: 3.7 mmol/L (ref 3.5–5.1)
Sodium: 134 mmol/L — ABNORMAL LOW (ref 135–145)

## 2017-06-05 LAB — ALBUMIN, PLEURAL OR PERITONEAL FLUID: ALBUMIN FL: 1.4 g/dL

## 2017-06-05 LAB — PROTEIN, PLEURAL OR PERITONEAL FLUID: Total protein, fluid: <3 g/dL

## 2017-06-05 MED ORDER — SODIUM CHLORIDE 0.9 % IV SOLN: 75.0000 mL | INTRAVENOUS | 0 refills | Status: DC

## 2017-06-05 MED ORDER — HEPARIN SODIUM (PORCINE) 5000 UNIT/ML IJ SOLN: 5000.0000 [IU] | Freq: Three times a day (TID) | INTRAMUSCULAR | Status: DC

## 2017-06-05 MED ORDER — HYDROMORPHONE HCL 2 MG PO TABS
1.0000 mg | ORAL_TABLET | ORAL | Status: DC | PRN
  Administered 2017-06-05 (×2): 1 mg via ORAL
  Filled 2017-06-05 (×2): qty 1

## 2017-06-05 MED ORDER — OXYCODONE-ACETAMINOPHEN 5-325 MG PO TABS: 1.0000 | ORAL_TABLET | ORAL | Status: DC | PRN

## 2017-06-05 MED ORDER — OXYCODONE-ACETAMINOPHEN 5-325 MG PO TABS
1.0000 | ORAL_TABLET | ORAL | Status: DC | PRN
  Administered 2017-06-05 – 2017-06-08 (×12): 2 via ORAL
  Filled 2017-06-05 (×12): qty 2

## 2017-06-05 MED ORDER — ALBUTEROL SULFATE (2.5 MG/3ML) 0.083% IN NEBU: 2.5000 mg | INHALATION_SOLUTION | RESPIRATORY_TRACT | 12 refills | Status: DC | PRN

## 2017-06-05 MED ORDER — HYDROMORPHONE HCL 2 MG PO TABS: 1.0000 mg | ORAL_TABLET | ORAL | 0 refills | Status: DC | PRN

## 2017-06-05 MED ORDER — ACETAMINOPHEN 325 MG PO TABS: 650.0000 mg | ORAL_TABLET | Freq: Four times a day (QID) | ORAL | Status: DC | PRN

## 2017-06-05 MED ORDER — DEXTROSE 5 % IV SOLN: 2.0000 g | INTRAVENOUS | Status: DC

## 2017-06-05 MED ORDER — ONDANSETRON HCL 4 MG PO TABS: 4.0000 mg | ORAL_TABLET | Freq: Four times a day (QID) | ORAL | 0 refills | Status: DC | PRN

## 2017-06-05 MED ORDER — METRONIDAZOLE IN NACL 5-0.79 MG/ML-% IV SOLN: 500.0000 mg | Freq: Three times a day (TID) | INTRAVENOUS | Status: DC

## 2017-06-05 NOTE — Discharge Summary (Addendum)
[facility] [place] NAME: Cynthia Mccarthy    MR#:  [id number]  DATE OF BIRTH:  11-Jun-1970  DATE OF ADMISSION:  06/01/2017 ADMITTING PHYSICIAN: Demetrios Loll, MD  DATE OF DISCHARGE: 06/08/2016 transfer to [place]: Sallee Lange, NP    ADMISSION DIAGNOSIS:  Other ascites [R18.8] AKI (acute kidney injury) ([place]) [N17.9] Ascites [R18.8] Leukocytosis, unspecified type [D72.829]  DISCHARGE DIAGNOSIS:  Active Problems:   Ascites   SECONDARY DIAGNOSIS:   Past Medical History:  Diagnosis Date  . Anxiety   . Cervical cancer ([place])   . Depression   . DVT (deep venous thrombosis) ([place])   . Hormone disorder   . Hypertension   . Hypothyroidism     HOSPITAL COURSE:   1.  Ascites, abdominal pain and nausea vomiting.  Paracentesis cytology is negative.  Paracentesis culture negative.  Patient on empiric antibiotics.  Case discussed with Dr. Rogue Bussing oncology who spoke with the patient's gynecological oncologist at [facility].  Patient will be set up for transfer at [facility] laparoscopic procedure.  The patient does have a history of cervical cancer in the past.  The patient has persistent pain and is  taking pain medications.  The patient also has nausea vomiting and taking nausea medications.  Patient was seen by gastroenterology and the ascites is not secondary to her liver.  Liver imaging negative for cirrhosis. 2.  Acute kidney injury.  Gentle IV fluid hydration.  Check creatinine daily.  Creatinine increased up to 3.8.  Case discussed with Dr. Candiss Norse nephrology.  Empiric steroids of Solu-Medrol 40 mg daily started for possibility of interstitial nephritis. 3.  Hypothyroidism unspecified on levothyroxine 4.  Depression and anxiety continue psychiatric medication 5.  History of cervical cancer in the past. 6.  History of neurogenic bladder with intermittent self catheterizations. 7.  DVT left lower extremity.  Heparin drip  will be started by pharmacy.  This will be short acting where the patient can stop if needed laparoscopic procedure.  DISCHARGE CONDITIONS:   [place]  CONSULTS OBTAINED:  Treatment Team:  Magnus Sinning, MD Cammie Sickle, MD  DRUG ALLERGIES:   Allergies  Allergen Reactions  . Penicillins Rash    Has patient had a PCN reaction causing immediate rash, facial/tongue/throat swelling, SOB or lightheadedness with hypotension: Yes Has patient had a PCN reaction causing severe rash involving mucus membranes or skin necrosis: No Has patient had a PCN reaction that required hospitalization: No Has patient had a PCN reaction occurring within the last 10 years: Yes If all of the above answers are "NO", then may proceed with Cephalosporin use.  . Sulfa Antibiotics Hives and Rash    DISCHARGE MEDICATIONS:   Allergies as of 06/08/2017      Reactions   Penicillins Rash   Has patient had a PCN reaction causing immediate rash, facial/tongue/throat swelling, SOB or lightheadedness with hypotension: Yes Has patient had a PCN reaction causing severe rash involving mucus membranes or skin necrosis: No Has patient had a PCN reaction that required hospitalization: No Has patient had a PCN reaction occurring within the last 10 years: Yes If all of the above answers are "NO", then may proceed with Cephalosporin use.   Sulfa Antibiotics Hives, Rash      Medication List    STOP taking these medications   Cranberry 1000 MG Caps   enalapril 5 MG tablet Commonly known as:  VASOTEC   nitrofurantoin (macrocrystal-monohydrate) 100 MG capsule  Commonly known as:  MACROBID   trimethoprim 100 MG tablet Commonly known as:  TRIMPEX   Vitamin D3 5000 units Tabs     TAKE these medications   acetaminophen 325 MG tablet Commonly known as:  TYLENOL Take 2 tablets (650 mg total) by mouth every 6 (six) hours as needed for mild pain (or Fever >/= 101).   albuterol (2.5 MG/3ML) 0.083% nebulizer  solution Commonly known as:  PROVENTIL Take 3 mLs (2.5 mg total) by nebulization every 2 (two) hours as needed for wheezing.   ALOE 00867 & PROBIOTICS PO Take 1 capsule by mouth daily.   busPIRone 15 MG tablet Commonly known as:  BUSPAR Take 15 mg by mouth daily.   cefTRIAXone 2 g in dextrose 5 % 50 mL Inject 2 g into the vein daily.   cetirizine 10 MG tablet Commonly known as:  ZYRTEC Take 10 mg by mouth daily.   fluticasone 50 MCG/ACT nasal spray Commonly known as:  FLONASE Place 2 sprays into both nostrils daily.   heparin 100-0.45 UNIT/ML-% infusion Inject 1,000 Units/hr into the vein continuous.   HYDROmorphone 2 MG tablet Commonly known as:  DILAUDID Take 0.5 tablets (1 mg total) by mouth every 4 (four) hours as needed for severe pain.   INTERMITTENT 14FR/40CM Misc Use for urinary self-catheterization 3 times a day as directed   levothyroxine 88 MCG tablet Commonly known as:  SYNTHROID, LEVOTHROID Take 88 mcg by mouth daily before breakfast.   methylPREDNISolone sodium succinate 40 mg/mL injection Commonly known as:  SOLU-MEDROL Inject 1 mL (40 mg total) into the vein daily.   metroNIDAZOLE 5-0.79 MG/ML-% IVPB Commonly known as:  FLAGYL Inject 100 mLs (500 mg total) into the vein every 8 (eight) hours.   mirabegron ER 50 MG Tb24 tablet Commonly known as:  MYRBETRIQ Take 1 tablet (50 mg total) by mouth daily.   ondansetron 4 MG tablet Commonly known as:  ZOFRAN Take 1 tablet (4 mg total) by mouth every 6 (six) hours as needed for nausea.   sertraline 100 MG tablet Commonly known as:  ZOLOFT Take 100 mg by mouth daily.   sodium chloride 0.9 % infusion Inject 75 mLs into the vein continuous.   traZODone 50 MG tablet Commonly known as:  DESYREL Take 1-2 tablets by mouth at bedtime.        DISCHARGE INSTRUCTIONS:   Follow-up with specialist at [facility]  If you experience worsening of your admission symptoms, develop shortness of breath, life  threatening emergency, suicidal or homicidal thoughts you must seek medical attention immediately by calling 911 or calling your MD immediately  if symptoms less severe.  You Must read complete instructions/literature along with all the possible adverse reactions/side effects for all the Medicines you take and that have been prescribed to you. Take any new Medicines after you have completely understood and accept all the possible adverse reactions/side effects.   Please note  You were cared for by a hospitalist during your hospital stay. If you have any questions about your discharge medications or the care you received while you were in the hospital after you are discharged, you can call the unit and asked to speak with the hospitalist on call if the hospitalist that took care of you is not available. Once you are discharged, your primary care physician will handle any further medical issues. Please note that NO REFILLS for any discharge medications will be authorized once you are discharged, as it is imperative that you return to  your primary care physician (or establish a relationship with a primary care physician if you do not have one) for your aftercare needs so that they can reassess your need for medications and monitor your lab values.    Today   CHIEF COMPLAINT:   Chief Complaint  Patient presents with  . Abdominal Pain    HISTORY OF PRESENT ILLNESS:  Cynthia Mccarthy  is a 47 y.o. female came in with abdominal pain and found to have ascites   VITAL SIGNS:  Blood pressure 140/89, pulse 89, temperature 98.1 F (36.7 C), resp. rate (!) 22, height 5\' 2"  (1.575 m), weight 79.4 kg (175 lb), SpO2 95 %.     DATA REVIEW:   CBC Recent Labs  Lab 06/07/17 0508  WBC 12.4*  HGB 12.8  HCT 39.5  PLT 429    Chemistries  Recent Labs  Lab 06/03/17 0500  06/07/17 2209  NA 136   < > 137  K 3.9   < > 4.1  CL 107   < > 110  CO2 19*   < > 17*  GLUCOSE 131*   < > 139*  BUN 40*   <  > 53*  CREATININE 2.77*   < > 3.75*  CALCIUM 8.6*   < > 8.9  AST 17  --   --   ALT 13*  --   --   ALKPHOS 37*  --   --   BILITOT 0.5  --   --    < > = values in this interval not displayed.    Cardiac Enzymes No results for input(s): TROPONINI in the last 168 hours.  Microbiology Results  Results for orders placed or performed during the hospital encounter of 06/01/17  Urine Culture     Status: Abnormal   Collection Time: 06/01/17 11:18 AM  Result Value Ref Range Status   Specimen Description   Final    URINE, RANDOM Performed at [facility], [address], [place], [place] [address]    Special Requests   Final    NONE Performed at [facility], [address], [place], [place] [address]    Culture (A)  Final    <10,000 COLONIES/mL INSIGNIFICANT GROWTH Performed at [facility], [place] [address], [place], [place] [address]    Report Status 06/02/2017 FINAL  Final  Aerobic/Anaerobic Culture (surgical/deep wound)     Status: None (Preliminary result)   Collection Time: 06/03/17  1:35 PM  Result Value Ref Range Status   Specimen Description   Final    PERITONEAL Performed at [facility], [address] [place], [place] [address]    Special Requests   Final    NONE Performed at [facility], [address] [place], [place] [address]    Gram Stain   Final    FEW WBC PRESENT, PREDOMINANTLY MONONUCLEAR NO ORGANISMS SEEN    Culture   Final    NO GROWTH 4 DAYS NO ANAEROBES ISOLATED; CULTURE IN PROGRESS FOR 5 DAYS Performed at [facility], [place] [address], [place], [place] [address]    Report Status PENDING  Incomplete  Urine Culture     Status: None   Collection Time: 06/04/17  4:33 PM  Result Value Ref Range Status   Specimen Description   Final    URINE, RANDOM Performed at [facility], [address] [place], [place] [address]    Special Requests   Final  Normal Performed at [facility], [address] [place], [place] [address]    Culture   Final    NO GROWTH Performed at [facility], [place] [address] [place], [place] [address]    Report Status 06/06/2017 FINAL  Final    RADIOLOGY:  US Venous Img Lower Bilateral  Result Date: 06/07/2017 CLINICAL DATA:  Bilateral lower extremity edema, prior DVT EXAM: BILATERAL LOWER EXTREMITY VENOUS DOPPLER ULTRASOUND TECHNIQUE: Gray-scale sonography with graded compression, as well as color Doppler and duplex ultrasound were performed to evaluate the lower extremity deep venous systems from the level of the common femoral vein and including the common femoral, femoral, profunda femoral, popliteal and calf veins including the posterior tibial, peroneal and gastrocnemius veins when visible. The superficial great saphenous vein was also interrogated. Spectral Doppler was utilized to evaluate flow at rest and with distal augmentation maneuvers in the common femoral, femoral and popliteal veins. COMPARISON:  None. FINDINGS: RIGHT LOWER EXTREMITY Common Femoral Vein: No evidence of thrombus. Normal compressibility, respiratory phasicity and response to augmentation. Saphenofemoral Junction: No evidence of thrombus. Normal compressibility and flow on color Doppler imaging. Profunda Femoral Vein: No evidence of thrombus. Normal compressibility and flow on color Doppler imaging. Femoral Vein: No evidence of thrombus. Normal compressibility, respiratory phasicity and response to augmentation. Popliteal Vein: No evidence of thrombus. Normal compressibility, respiratory phasicity and response to augmentation. Calf Veins: No evidence of thrombus. Normal compressibility and flow on color Doppler imaging. Superficial Great Saphenous Vein: No evidence of thrombus. Normal compressibility. Venous Reflux:  None. Other Findings:  None. LEFT LOWER EXTREMITY Common Femoral Vein: No evidence of thrombus. Normal compressibility, respiratory phasicity and response  to augmentation. Saphenofemoral Junction: No evidence of thrombus. Normal compressibility and flow on color Doppler imaging. Profunda Femoral Vein: No evidence of thrombus. Normal compressibility and flow on color Doppler imaging. Femoral Vein: No evidence of thrombus. Normal compressibility, respiratory phasicity and response to augmentation. Popliteal Vein: Hypoechoic intraluminal thrombus noted. Vessel is partially compressible. Popliteal thrombus is nonocclusive. Phasic flow demonstrated. Calf Veins: Thrombus appears to extend into the calf posterior tibial veins which are noncompressible and appear occlusive. Peroneal veins are grossly patent. Superficial Great Saphenous Vein: No evidence of thrombus. Normal compressibility. Venous Reflux:  None. Other Findings:  None. IMPRESSION: Negative for right lower extremity DVT Positive for left popliteal and calf posterior tibial DVT as above. Electronically Signed   By: Jerilynn Mages.  Shick M.D.   On: 06/07/2017 12:01     Management plans discussed with the patient, family and they are in agreement.  CODE STATUS:     Code Status Orders  (From admission, onward)        Start     Ordered   06/01/17 1351  Full code  Continuous     06/01/17 1350    Code Status History    Date Active Date Inactive Code Status Order ID Comments User Context   This patient has a current code status but no historical code status.      TOTAL TIME TAKING CARE OF THIS PATIENT: 40 minutes.    Loletha Grayer M.D on 06/08/2017 at 7:53 AM  Between 7am to 6pm - Pager - [phone]  After 6pm go to [email or web address] - password [email or web address] [facility]  [phone]  CC: Primary care physician; Sallee Lange, NP

## 2017-06-05 NOTE — Care Management (Signed)
This RNCM received a call from RN requesting assistance with in and out self- cath [facility] [phone]. Patient's husband has brought patient's home caths in for patient to use. He has one box left at home.  He has contacted his [place] with [facility] [phone] and has been told that "they will not supply patient with Coloplast Cath while she is in the hospital- insurance will not cover it".  I have called supply chain, mother baby, and surgical suite and they do not have this in stock and it would be several days before we could receive from supply chain- if it could be ordered.  I have advised husband Cynthia Mccarthy to use what he has brought from home and that he may have to pay privately for additional caths through [place].  I have left message for Crystal with [facility] regarding medical necessity as we do not have the cath in stock. Unit director updated when she contacted [facility] for update.

## 2017-06-05 NOTE — Telephone Encounter (Signed)
Discussed with Dr.Berchuk who kindly agrees to accept the pt at [facility] given the lack of significant improvement the last few days. Cytology-neg; discussed with Dr.Weiting.

## 2017-06-05 NOTE — Progress Notes (Signed)
Contacted CM - Angie regarding patient needing speedicath compacts to self catheterize. Supply is running low and company will not provide while patient is in the hospital due to insurance. Patient is not able to use any catheters here in the hospital.   Angie to contact company. Patient and patients husband at bedside made aware. Madlyn Frankel, RN

## 2017-06-05 NOTE — Progress Notes (Signed)
10 min after medication administration, patient vomited. All medications whole and visible. Spoke with pharmacist, Nate regarding re administering patients medication. Pharmacist stated that this would be appropriate. IV Zofran given, will attempt to give patients medications again in 30 min. Madlyn Frankel, RN

## 2017-06-05 NOTE — Progress Notes (Signed)
Patient ID: Cynthia Mccarthy, female   DOB: [birth year], 47 y.o.   MRN: [id number]  [facility] PROGRESS NOTE  Cynthia Mccarthy DOB: 12/10/1970 DOA: 06/01/2017 PCP: Cynthia Lange, NP  HPI/Subjective: Patient still with a lot of discomfort.  Had nausea vomiting this morning.  Still taking a lot of pain medications.  Objective: Vitals:   06/05/17 0544 06/05/17 1339  BP: 123/69 129/77  Pulse: 86 93  Resp: 15 20  Temp: 98.4 F (36.9 C) 98.5 F (36.9 C)  SpO2: 96% 96%    Filed Weights   06/01/17 1443  Weight: 79.4 kg (175 lb)    ROS: Review of Systems  Constitutional: Negative for chills and fever.  Eyes: Negative for blurred vision.  Respiratory: Negative for cough and shortness of breath.   Cardiovascular: Negative for chest pain.  Gastrointestinal: Positive for abdominal pain, nausea and vomiting. Negative for constipation and diarrhea.  Genitourinary: Negative for dysuria.  Musculoskeletal: Negative for joint pain.  Neurological: Negative for dizziness and headaches.   Exam: Physical Exam  Constitutional: She is oriented to person, place, and time.  HENT:  Nose: No mucosal edema.  Mouth/Throat: No oropharyngeal exudate or posterior oropharyngeal edema.  Eyes: Conjunctivae, EOM and lids are normal. Pupils are equal, round, and reactive to light.  Neck: No JVD present. Carotid bruit is not present. No edema present. No thyroid mass and no thyromegaly present.  Cardiovascular: S1 normal and S2 normal. Exam reveals no gallop.  No murmur heard. Pulses:      Dorsalis pedis pulses are 2+ on the right side, and 2+ on the left side.  Respiratory: No respiratory distress. She has decreased breath sounds in the right lower field and the left lower field. She has no wheezes. She has no rhonchi. She has no rales.  GI: Soft. Bowel sounds are normal. She exhibits distension. There is tenderness.  Musculoskeletal:       Right ankle: She exhibits no swelling.        Left ankle: She exhibits no swelling.  Lymphadenopathy:    She has no cervical adenopathy.  Neurological: She is alert and oriented to person, place, and time. No cranial nerve deficit.  Skin: Skin is warm. No rash noted. Nails show no clubbing.  Psychiatric: She has a normal mood and affect.      Data Reviewed: Basic Metabolic Panel: Recent Labs  Lab 06/02/17 0340 06/02/17 1557 06/03/17 0500 06/04/17 0355 06/05/17 0409  NA 139 139 136 136 134*  K 3.9 3.9 3.9 4.2 3.7  CL 109 109 107 107 107  CO2 23 19* 19* 17* 17*  GLUCOSE 118* 133* 131* 121* 122*  BUN 35* 38* 40* 48* 50*  CREATININE 1.50* 2.13* 2.77* 3.35* 2.86*  CALCIUM 8.0* 8.4* 8.6* 8.4* 8.4*   Liver Function Tests: Recent Labs  Lab 06/01/17 1006 06/02/17 1557 06/03/17 0500  AST 22 18 17   ALT 18 13* 13*  ALKPHOS 44 36* 37*  BILITOT 0.5 0.4 0.5  PROT 7.6 7.0 7.2  ALBUMIN 4.3 3.8 3.8   Recent Labs  Lab 06/01/17 1006  LIPASE 33   CBC: Recent Labs  Lab 06/01/17 1006 06/02/17 0340 06/03/17 0500 06/04/17 0355  WBC 18.8* 13.4* 14.2* 13.0*  HGB 14.8 13.0 13.6 13.2  HCT 45.5 39.7 41.4 40.2  MCV 87.4 88.1 88.4 88.5  PLT 412 334 346 369     Recent Results (from the past 240 hour(s))  Urine Culture     Status: None  Collection Time: 06/01/17  8:47 AM  Result Value Ref Range Status   Specimen Description   Final    URINE, CLEAN CATCH Performed at [facility], [address], [place], [place] [address]    Special Requests   Final    NONE Performed at [facility], [address], [place], [place] [address]    Culture   Final    NO GROWTH Performed at [facility], [place] [address] [place], [place] [address]    Report Status 06/03/2017 FINAL  Final  Urine Culture     Status: Abnormal   Collection Time: 06/01/17 11:18 AM  Result Value Ref Range Status   Specimen Description   Final    URINE, RANDOM Performed at [facility], [address],  [place], [place] [address]    Special Requests   Final    NONE Performed at [facility], [address] [place], [place] [address]    Culture (A)  Final    <10,000 COLONIES/mL INSIGNIFICANT GROWTH Performed at [facility], [place] [address], [place], [place] [address]    Report Status 06/02/2017 FINAL  Final  Aerobic/Anaerobic Culture (surgical/deep wound)     Status: None (Preliminary result)   Collection Time: 06/03/17  1:35 PM  Result Value Ref Range Status   Specimen Description   Final    PERITONEAL Performed at [facility], [address], [place], [place] [address]    Special Requests   Final    NONE Performed at [facility], [address] [place], [place] [address]    Gram Stain   Final    FEW WBC PRESENT, PREDOMINANTLY MONONUCLEAR NO ORGANISMS SEEN    Culture   Final    NO GROWTH 2 DAYS NO ANAEROBES ISOLATED; CULTURE IN PROGRESS FOR 5 DAYS Performed at [facility], [place] [address], [place],  [address]    Report Status PENDING  Incomplete     Studies: US Renal  Result Date: 06/04/2017 CLINICAL DATA:  Acute kidney injury. EXAM: RENAL / URINARY TRACT ULTRASOUND COMPLETE COMPARISON:  Ultrasound of March 06, 2017. FINDINGS: Right Kidney: Length: 10.2 cm. Echogenicity within normal limits. No mass or hydronephrosis visualized. Left Kidney: Length: 11 cm. Echogenicity within normal limits. No mass or hydronephrosis visualized. Bladder: Appears normal for degree of bladder distention. Mild ascites is noted in the pelvis and right upper quadrant. IMPRESSION: Kidneys are unremarkable.  Mild ascites is noted. Electronically Signed   By: Marijo Conception, M.D.   On: 06/04/2017 09:59    Scheduled Meds: . busPIRone  15 mg Oral Daily  . fluticasone  2 spray Each Nare Daily  . heparin  5,000 Units Subcutaneous Q8H  . levothyroxine  88 mcg Oral QAC breakfast  . loratadine  10 mg Oral Daily  . mirabegron ER  50 mg Oral Daily  .  sertraline  100 mg Oral Daily  . sodium chloride flush  3 mL Intravenous Q12H  . traZODone  50-100 mg Oral QHS   Continuous Infusions: . sodium chloride    . sodium chloride 75 mL/hr at 06/05/17 1042  . cefTRIAXone (ROCEPHIN)  IV Stopped (06/04/17 1341)  . metronidazole 500 mg (06/05/17 1538)    Assessment/Plan:  1. Ascites, abdominal pain and nausea vomiting.  So far unclear etiology.  Culture of ascites fluid is negative.  Cytology is negative.  Case discussed with oncology who spoke with her going on physician  Dr. Vela Prose.  The plan will be transferred to [facility] when bed available for laparoscopic procedure.  Patient is on empiric antibiotics but I am not sure when on treating.  Could potentially be an SBP.  Unfortunately they did not send a white blood cell count from the fluid. 2. Acute kidney injury.  Gentle IV fluid hydration 3. Hypothyroidism unspecified 4. Depression and anxiety continue psychiatric medications 5. History of cervical cancer in the past  Code Status:     Code Status Orders  (From admission, onward)        Start     Ordered   06/01/17 1351  Full code  Continuous     06/01/17 1350    Code Status History    Date Active Date Inactive Code Status Order ID Comments User Context   This patient has a current code status but no historical code status.     Family Communication: Husband at the bedside Disposition Plan: To be determined  Antibiotics:  Rocephin  Flagyl  Time spent: 28 minutes  [place]

## 2017-06-05 NOTE — Progress Notes (Signed)
[facility], [place] 06/05/17  Subjective:   Patient continues to feel poorly.  She states there has not been any significant improvement in her clinical condition She continues to feel nauseous No shortness of breath She is able to get up and go to the bathroom and do a self-catheterization She is continued on maintenance IV fluids She has been trying to eat small amount of food Serum creatinine today has improved to 2.86  Objective:  Vital signs in last 24 hours:  Temp:  [98.1 F (36.7 C)-98.5 F (36.9 C)] 98.5 F (36.9 C) (01/17 1339) Pulse Rate:  [86-93] 93 (01/17 1339) Resp:  [15-20] 20 (01/17 1339) BP: (123-129)/(66-77) 129/77 (01/17 1339) SpO2:  [96 %-98 %] 96 % (01/17 1339)  Weight change:  Filed Weights   06/01/17 1443  Weight: 79.4 kg (175 lb)    Intake/Output:    Intake/Output Summary (Last 24 hours) at 06/05/2017 1406 Last data filed at 06/05/2017 1038 Gross per 24 hour  Intake 120 ml  Output 1150 ml  Net -1030 ml     Physical Exam: General:  Ill-appearing, laying in the bed  HEENT  moist oral mucous membranes  Neck  supple  Pulm/lungs  normal breathing effort, clear to auscultation  CVS/Heart  regular rhythm, no rub or gallop  Abdomen:   Soft, mild lower abdominal tenderness, nondistended  Extremities:  Trace peripheral edema  Neurologic:  Alert, oriented  Skin:  Normal turgor, no acute rashes          Basic Metabolic Panel:  Recent Labs  Lab 06/02/17 0340 06/02/17 1557 06/03/17 0500 06/04/17 0355 06/05/17 0409  NA 139 139 136 136 134*  K 3.9 3.9 3.9 4.2 3.7  CL 109 109 107 107 107  CO2 23 19* 19* 17* 17*  GLUCOSE 118* 133* 131* 121* 122*  BUN 35* 38* 40* 48* 50*  CREATININE 1.50* 2.13* 2.77* 3.35* 2.86*  CALCIUM 8.0* 8.4* 8.6* 8.4* 8.4*     CBC: Recent Labs  Lab 06/01/17 1006 06/02/17 0340 06/03/17 0500 06/04/17 0355  WBC 18.8* 13.4* 14.2* 13.0*  HGB 14.8 13.0 13.6 13.2  HCT 45.5 39.7 41.4  40.2  MCV 87.4 88.1 88.4 88.5  PLT 412 334 346 369     No results found for: HEPBSAG, HEPBSAB, HEPBIGM    Microbiology:  Recent Results (from the past 240 hour(s))  Urine Culture     Status: None   Collection Time: 06/01/17  8:47 AM  Result Value Ref Range Status   Specimen Description   Final    URINE, CLEAN CATCH Performed at [facility], [address], [place], [place] [address]    Special Requests   Final    NONE Performed at [facility], [address], [place], [place] [address]    Culture   Final    NO GROWTH Performed at [facility], [place] [address] [place], [place] [address]    Report Status 06/03/2017 FINAL  Final  Urine Culture     Status: Abnormal   Collection Time: 06/01/17 11:18 AM  Result Value Ref Range Status   Specimen Description   Final    URINE, RANDOM Performed at [facility], [address], [place], [place] [address]    Special Requests   Final    NONE Performed at [facility], [address] [place], [place] [address]    Culture (A)  Final    <10,000 COLONIES/mL INSIGNIFICANT GROWTH  Performed at [facility], [place] [address] [place], [place] [address]    Report Status 06/02/2017 FINAL  Final  Aerobic/Anaerobic Culture (surgical/deep wound)     Status: None (Preliminary result)   Collection Time: 06/03/17  1:35 PM  Result Value Ref Range Status   Specimen Description   Final    PERITONEAL Performed at [facility], [address] [place], [place] [address]    Special Requests   Final    NONE Performed at [facility], [place] [address] [place], [place] [address]    Gram Stain   Final    FEW WBC PRESENT, PREDOMINANTLY MONONUCLEAR NO ORGANISMS SEEN    Culture   Final    NO GROWTH 2 DAYS Performed at [facility], [place] [address] [place], [place] [address]    Report Status PENDING  Incomplete    Coagulation Studies: No results for input(s):  LABPROT, INR in the last 72 hours.  Urinalysis: Recent Labs    06/04/17 1230  COLORURINE YELLOW*  LABSPEC 1.017  PHURINE 5.0  GLUCOSEU NEGATIVE  HGBUR SMALL*  BILIRUBINUR NEGATIVE  KETONESUR 5*  PROTEINUR 100*  NITRITE NEGATIVE  LEUKOCYTESUR TRACE*      Imaging: US Renal  Result Date: 06/04/2017 CLINICAL DATA:  Acute kidney injury. EXAM: RENAL / URINARY TRACT ULTRASOUND COMPLETE COMPARISON:  Ultrasound of March 06, 2017. FINDINGS: Right Kidney: Length: 10.2 cm. Echogenicity within normal limits. No mass or hydronephrosis visualized. Left Kidney: Length: 11 cm. Echogenicity within normal limits. No mass or hydronephrosis visualized. Bladder: Appears normal for degree of bladder distention. Mild ascites is noted in the pelvis and right upper quadrant. IMPRESSION: Kidneys are unremarkable.  Mild ascites is noted. Electronically Signed   By: Marijo Conception, M.D.   On: 06/04/2017 09:59     Medications:   . sodium chloride    . sodium chloride 75 mL/hr at 06/05/17 1042  . cefTRIAXone (ROCEPHIN)  IV Stopped (06/04/17 1341)  . metronidazole Stopped (06/05/17 0515)   . busPIRone  15 mg Oral Daily  . fluticasone  2 spray Each Nare Daily  . heparin  5,000 Units Subcutaneous Q8H  . levothyroxine  88 mcg Oral QAC breakfast  . loratadine  10 mg Oral Daily  . mirabegron ER  50 mg Oral Daily  . sertraline  100 mg Oral Daily  . sodium chloride flush  3 mL Intravenous Q12H  . traZODone  50-100 mg Oral QHS   sodium chloride, acetaminophen **OR** acetaminophen, albuterol, alum & mag hydroxide-simeth, bisacodyl, HYDROmorphone, ondansetron **OR** ondansetron (ZOFRAN) IV, oxyCODONE-acetaminophen, senna-docusate, sodium chloride flush  Assessment/ Plan:  47 y.o. female with medical problems of hypertension, hypothyroidism, neurogenic bladder requiring intermittent self caths, hx of cervical cancer in 2003 treated with chemo and radiation, previous hysterectomy who was admitted to [facility] on  06/01/2017 for evaluation of lower abdominal pain with nausea.   1.  Acute Renal failure. Baseline creatinine is unknown. Admit Cr is 1.63 Creatinine and BUN have increased since admission.  Today's creatinine has slightly improved to 2.86. Urine output is not being closely monitored.  Recorded at 1150 cc  Acute renal failure is likely secondary to ATN due to volume shifts in the setting of taking ACE-I.  Continue IV fluids for hydration.   Urinalysis shows protein.  urine protein to creatinine ratio is 2.01 Electrolytes and Volume status are acceptable No acute indication for Dialysis at present   2. Pelvic ascites. Unknown etiology.  Paracentesis performed, awaiting  results  Manage per hospitalist and GI teams.   3. Neurogenic bladder with frequent UTIs Requires intermittent self caths     LOS: 4 Attie Nawabi 1/17/20192:06 PM  [place], [place]

## 2017-06-06 LAB — BASIC METABOLIC PANEL
ANION GAP: 9 (ref 5–15)
BUN: 55 mg/dL — AB (ref 6–20)
CO2: 17 mmol/L — ABNORMAL LOW (ref 22–32)
Calcium: 8.5 mg/dL — ABNORMAL LOW (ref 8.9–10.3)
Chloride: 112 mmol/L — ABNORMAL HIGH (ref 101–111)
Creatinine, Ser: 3.11 mg/dL — ABNORMAL HIGH (ref 0.44–1.00)
GFR calc Af Amer: 19 mL/min — ABNORMAL LOW (ref >60)
GFR calc non Af Amer: 17 mL/min — ABNORMAL LOW (ref >60)
Glucose, Bld: 138 mg/dL — ABNORMAL HIGH (ref 65–99)
POTASSIUM: 3.7 mmol/L (ref 3.5–5.1)
SODIUM: 138 mmol/L (ref 135–145)

## 2017-06-06 LAB — URINE CULTURE
Culture: NO GROWTH
Special Requests: NORMAL

## 2017-06-06 LAB — PROTEIN, BODY FLUID (OTHER): Total Protein, Body Fluid Other: 2.4 g/dL

## 2017-06-06 NOTE — Progress Notes (Signed)
Vonda Antigua, MD [address], [place], [place], [place], [address] [address], [place], [place], [place], [address] Phone: [phone]  Fax: [phone]   Subjective: Patient sitting up in chair today.  Continues to have bilateral lower quadrant and suprapubic pain.  No nausea vomiting.   Objective: Vital signs in last 24 hours: Vitals:   06/05/17 1339 06/05/17 2136 06/06/17 0544 06/06/17 0930  BP: 129/77 124/68 120/75 122/81  Pulse: 93 90 87 85  Resp: 20 (!) 22 (!) 22 18  Temp: 98.5 F (36.9 C) 98.6 F (37 C) 97.8 F (36.6 C) 98.1 F (36.7 C)  TempSrc: Oral Oral Oral Oral  SpO2: 96% 95% 94% 99%  Weight:      Height:       Weight change:   Intake/Output Summary (Last 24 hours) at 06/06/2017 1038 Last data filed at 06/06/2017 1032 Gross per 24 hour  Intake 2948.75 ml  Output 975 ml  Net 1973.75 ml     Exam: General: No acute distress, AAO x3 Abd: Soft, NT/ND, No HSM Skin: Warm, no rashes Neck: Supple, Trachea midline   Lab Results: Labs reviewed Micro Results: Recent Results (from the past 240 hour(s))  Urine Culture     Status: None   Collection Time: 06/01/17  8:47 AM  Result Value Ref Range Status   Specimen Description   Final    URINE, CLEAN CATCH Performed at [facility], [address], [place], [place] [address]    Special Requests   Final    NONE Performed at [facility], [address], [place], [place] [address]    Culture   Final    NO GROWTH Performed at [facility], [place] [address] [place], [place] [address]    Report Status 06/03/2017 FINAL  Final  Urine Culture     Status: Abnormal   Collection Time: 06/01/17 11:18 AM  Result Value Ref Range Status   Specimen Description   Final    URINE, RANDOM Performed at [facility], [address] [place], [place] [address]    Special Requests   Final    NONE Performed at [facility], [address] [place],  [place] [address]    Culture (A)  Final    <10,000 COLONIES/mL INSIGNIFICANT GROWTH Performed at [facility], [place] [address] [place], [place] [address]    Report Status 06/02/2017 FINAL  Final  Aerobic/Anaerobic Culture (surgical/deep wound)     Status: None (Preliminary result)   Collection Time: 06/03/17  1:35 PM  Result Value Ref Range Status   Specimen Description   Final    PERITONEAL Performed at [facility], [address] [place], [place] [address]    Special Requests   Final    NONE Performed at [facility], [address] [place], [place] [address]    Gram Stain   Final    FEW WBC PRESENT, PREDOMINANTLY MONONUCLEAR NO ORGANISMS SEEN    Culture   Final    NO GROWTH 2 DAYS NO ANAEROBES ISOLATED; CULTURE IN PROGRESS FOR 5 DAYS Performed at [facility], [place] [address], [place],  [address]    Report Status PENDING  Incomplete  Urine Culture     Status: None   Collection Time: 06/04/17  4:33 PM  Result Value Ref Range Status   Specimen Description   Final    URINE, RANDOM Performed at [facility], [place]  [address] [place], [place] [address]    Special Requests   Final    Normal Performed at [facility], [address] [place] [address] [place], [place] [address]    Culture   Final    NO GROWTH Performed at [facility], [place] [address] [place], [place] [address]    Report Status 06/06/2017 FINAL  Final   Studies/Results: No results found. Medications:  Scheduled Meds: . busPIRone  15 mg Oral Daily  . fluticasone  2 spray Each Nare Daily  . heparin  5,000 Units Subcutaneous Q8H  . levothyroxine  88 mcg Oral QAC breakfast  . loratadine  10 mg Oral Daily  . mirabegron ER  50 mg Oral Daily  . sertraline  100 mg Oral Daily  . sodium chloride flush  3 mL Intravenous Q12H  . traZODone  50-100 mg Oral QHS   Continuous Infusions: . sodium chloride    . sodium chloride 75 mL/hr at 06/06/17 0130  . cefTRIAXone  (ROCEPHIN)  IV Stopped (06/05/17 1814)  . metronidazole Stopped (06/06/17 0648)   PRN Meds:.sodium chloride, acetaminophen **OR** acetaminophen, albuterol, alum & mag hydroxide-simeth, bisacodyl, HYDROmorphone, ondansetron **OR** ondansetron (ZOFRAN) IV, oxyCODONE-acetaminophen, senna-docusate, sodium chloride flush   Assessment: Active Problems:   Ascites    Plan: Primary team is planning on transferring patient to [place] after discussion with oncology for possible expiratory laparotomy to evaluate her pelvic ascites Further care as per primary team and oncology No evidence of liver cirrhosis or ascites being due to hepatic causes  GI service will sign off   LOS: 5 days   Vonda Antigua, MD 06/06/2017, 10:38 AM

## 2017-06-06 NOTE — Progress Notes (Signed)
Cynthia Mccarthy   DOB:05-24-1970   DG#:[id number]    Subjective:  She notes to have slight improvement of the abdominal discomfort.  Patient last got her hydrocodone about 3:00 in the morning.  Her nausea is better.  No vomiting she continues to walk to the bathroom.  She states that she has been using to self cath every time.  She is frustrated.  Review system: No fever no chills.    Objective:  Vitals:   06/06/17 0544 06/06/17 0930  BP: 120/75 122/81  Pulse: 87 85  Resp: (!) 22 18  Temp: 97.8 F (36.6 C) 98.1 F (36.7 C)  SpO2: 94% 99%     Intake/Output Summary (Last 24 hours) at 06/06/2017 1012 Last data filed at 06/06/2017 0800 Gross per 24 hour  Intake 2408.75 ml  Output 975 ml  Net 1433.75 ml    GENERAL: Well-nourished well-developed; Alert, no distress and comfortable.   With her family. EYES: no pallor or icterus OROPHARYNX: no thrush or ulceration. NECK: supple, no masses felt LYMPH:  no palpable lymphadenopathy in the cervical, axillary or inguinal regions LUNGS: decreased breath sounds to auscultation at bases and  No wheeze or crackles HEART/CVS: regular rate & rhythm and no murmurs; No lower extremity edema ABDOMEN: abdomen soft, tender on deep palpation.  And normal bowel sounds;  Musculoskeletal:no cyanosis of digits and no clubbing  PSYCH: alert & oriented x 3 with fluent speech NEURO: no focal motor/sensory deficits SKIN:  no rashes or significant lesions   Labs:  Lab Results  Component Value Date   WBC 13.0 (H) 06/04/2017   HGB 13.2 06/04/2017   HCT 40.2 06/04/2017   MCV 88.5 06/04/2017   PLT 369 06/04/2017    Lab Results  Component Value Date   NA 138 06/06/2017   K 3.7 06/06/2017   CL 112 (H) 06/06/2017   CO2 17 (L) 06/06/2017    Studies:  No results found.  Assessment & Plan:   # 47 year old female patient history of stage III cervical cancer-currently admitted to the hospital for worsening abdominal pain noted to have pelvic  ascites  # Pelvic ascites moderate to large-question etiology-inflammatory versus malignant [remote history of cervical cancer]. Status post CT-guided aspiration-slight improvement of the pain/distention; no completely resolved. No obvious infection noted on the fluid studies.  # Cervical cancer stage III [as per history; 2003]-treated by chemoradiation.  It is unclear if pelvic ascites is related to recurrence of her cervical cancer [statistically less likely; but still possible].  Cytology negative for malignancy; negative for inflammation. Discussed with Dr.Berchuck; kindly accepted at [facility].   # CKD-with worsening renal function; question etiology-   Creatinine today 3.2; not improving. Ultrasound negative; nephrology following.  Discussed with Dr. Candiss Norse.  #Accepted at [facility];  Awaiting bed availability.  Discussed with Dr. Earleen Newport.   -Cammie Sickle, MD 06/06/2017  10:12 AM

## 2017-06-06 NOTE — Care Management (Signed)
Per nurse report to MD, [facility] does not have a bed today- may be Monday.

## 2017-06-06 NOTE — Consult Note (Addendum)
Pharmacy Antibiotic Note  Cynthia Mccarthy is a 47 y.o. female admitted on 06/01/2017 withIntra-abdominal infection.  Pharmacy has been consulted for ceftriaxone dosing. Patient is also receiving metronidazole.   Plan: Continue ceftriaxone 2g IV every 24 hours. Plan is to transfer to [facility] when bed available.   Height: 5\' 2"  (157.5 cm) Weight: 175 lb (79.4 kg) IBW/kg (Calculated) : 50.1  Temp (24hrs), Avg:98.1 F (36.7 C), Min:97.7 F (36.5 C), Max:98.6 F (37 C)  Recent Labs  Lab 06/01/17 1006 06/02/17 0340 06/02/17 1557 06/03/17 0500 06/04/17 0355 06/05/17 0409 06/06/17 0514  WBC 18.8* 13.4*  --  14.2* 13.0*  --   --   CREATININE 1.63* 1.50* 2.13* 2.77* 3.35* 2.86* 3.11*    Estimated Creatinine Clearance: 21.6 mL/min (A) (by C-G formula based on SCr of 3.11 mg/dL (H)).    Allergies  Allergen Reactions  . Penicillins Rash    Has patient had a PCN reaction causing immediate rash, facial/tongue/throat swelling, SOB or lightheadedness with hypotension: Yes Has patient had a PCN reaction causing severe rash involving mucus membranes or skin necrosis: No Has patient had a PCN reaction that required hospitalization: No Has patient had a PCN reaction occurring within the last 10 years: Yes If all of the above answers are "NO", then may proceed with Cephalosporin use.  . Sulfa Antibiotics Hives and Rash    Antimicrobials this admission: 1/13 Metronidazole  >>  1/13 Ceftriaxone  >>   Dose adjustments this admission:  Microbiology results: 1/13 UCx: pending  Thank you for allowing pharmacy to be a part of this patient's care.  Laural Benes, PharmD, BCPS Clinical Pharmacist 06/06/2017 1:58 PM

## 2017-06-06 NOTE — Progress Notes (Signed)
Patient ID: Cynthia Mccarthy, female   DOB: Oct 14, 1970, 47 y.o.   MRN: [id number]  [employer] PROGRESS NOTE  LAPORSCHA Mccarthy MOQ:[id number] DOB: January 27, 1971 DOA: 06/01/2017 PCP: Cynthia Lange, NP  HPI/Subjective: Patient still not feeling great.  Still with some nausea.  Still with abdominal pain.  Objective: Vitals:   06/06/17 0930 06/06/17 1306  BP: 122/81 118/81  Pulse: 85 79  Resp: 18 20  Temp: 98.1 F (36.7 C) 97.7 F (36.5 C)  SpO2: 99% 100%    Filed Weights   06/01/17 1443  Weight: 79.4 kg (175 lb)    ROS: Review of Systems  Constitutional: Negative for chills and fever.  Eyes: Negative for blurred vision.  Respiratory: Negative for cough and shortness of breath.   Cardiovascular: Negative for chest pain.  Gastrointestinal: Positive for abdominal pain and nausea. Negative for constipation, diarrhea and vomiting.  Genitourinary: Negative for dysuria.  Musculoskeletal: Negative for joint pain.  Neurological: Negative for dizziness and headaches.   Exam: Physical Exam  Constitutional: She is oriented to person, place, and time.  HENT:  Nose: No mucosal edema.  Mouth/Throat: No oropharyngeal exudate or posterior oropharyngeal edema.  Eyes: Conjunctivae, EOM and lids are normal. Pupils are equal, round, and reactive to light.  Neck: No JVD present. Carotid bruit is not present. No edema present. No thyroid mass and no thyromegaly present.  Cardiovascular: S1 normal and S2 normal. Exam reveals no gallop.  No murmur heard. Pulses:      Dorsalis pedis pulses are 2+ on the right side, and 2+ on the left side.  Respiratory: No respiratory distress. She has decreased breath sounds in the right lower field and the left lower field. She has no wheezes. She has no rhonchi. She has no rales.  GI: Soft. Bowel sounds are normal. She exhibits distension. There is tenderness.  Musculoskeletal:       Right ankle: She exhibits no swelling.       Left ankle: She  exhibits no swelling.  Lymphadenopathy:    She has no cervical adenopathy.  Neurological: She is alert and oriented to person, place, and time. No cranial nerve deficit.  Skin: Skin is warm. No rash noted. Nails show no clubbing.  Psychiatric: She has a normal mood and affect.      Data Reviewed: Basic Metabolic Panel: Recent Labs  Lab 06/02/17 1557 06/03/17 0500 06/04/17 0355 06/05/17 0409 06/06/17 0514  NA 139 136 136 134* 138  K 3.9 3.9 4.2 3.7 3.7  CL 109 107 107 107 112*  CO2 19* 19* 17* 17* 17*  GLUCOSE 133* 131* 121* 122* 138*  BUN 38* 40* 48* 50* 55*  CREATININE 2.13* 2.77* 3.35* 2.86* 3.11*  CALCIUM 8.4* 8.6* 8.4* 8.4* 8.5*   Liver Function Tests: Recent Labs  Lab 06/01/17 1006 06/02/17 1557 06/03/17 0500  AST 22 18 17   ALT 18 13* 13*  ALKPHOS 44 36* 37*  BILITOT 0.5 0.4 0.5  PROT 7.6 7.0 7.2  ALBUMIN 4.3 3.8 3.8   Recent Labs  Lab 06/01/17 1006  LIPASE 33   CBC: Recent Labs  Lab 06/01/17 1006 06/02/17 0340 06/03/17 0500 06/04/17 0355  WBC 18.8* 13.4* 14.2* 13.0*  HGB 14.8 13.0 13.6 13.2  HCT 45.5 39.7 41.4 40.2  MCV 87.4 88.1 88.4 88.5  PLT 412 334 346 369     Recent Results (from the past 240 hour(s))  Urine Culture     Status: None   Collection Time: 06/01/17  8:47  AM  Result Value Ref Range Status   Specimen Description   Final    URINE, CLEAN CATCH Performed at [facility], [address], [place], [place] [address]    Special Requests   Final    NONE Performed at [facility], [address] [place], [place] [address]    Culture   Final    NO GROWTH Performed at [facility], [place] [address] [place], [place] [address]    Report Status 06/03/2017 FINAL  Final  Urine Culture     Status: Abnormal   Collection Time: 06/01/17 11:18 AM  Result Value Ref Range Status   Specimen Description   Final    URINE, RANDOM Performed at [facility], [address], [place], [place]  [address]    Special Requests   Final    NONE Performed at [facility], [address] [place], [place] [address]    Culture (A)  Final    <10,000 COLONIES/mL INSIGNIFICANT GROWTH Performed at [facility], [place] [address] [place], [place] [address]    Report Status 06/02/2017 FINAL  Final  Aerobic/Anaerobic Culture (surgical/deep wound)     Status: None (Preliminary result)   Collection Time: 06/03/17  1:35 PM  Result Value Ref Range Status   Specimen Description   Final    PERITONEAL Performed at [facility], [address] [place], [place] [address]    Special Requests   Final    NONE Performed at [facility], [address] [place], [place] [address]    Gram Stain   Final    FEW WBC PRESENT, PREDOMINANTLY MONONUCLEAR NO ORGANISMS SEEN    Culture   Final    NO GROWTH 3 DAYS NO ANAEROBES ISOLATED; CULTURE IN PROGRESS FOR 5 DAYS Performed at [facility], [place] [address], [place], [place] [address]    Report Status PENDING  Incomplete  Urine Culture     Status: None   Collection Time: 06/04/17  4:33 PM  Result Value Ref Range Status   Specimen Description   Final    URINE, RANDOM Performed at [facility], [address] [place], [place] [address]    Special Requests   Final    Normal Performed at [facility], [address] [place], [place] [address]    Culture   Final    NO GROWTH Performed at [facility], [place] [address], [place],  [address]    Report Status 06/06/2017 FINAL  Final      Scheduled Meds: . busPIRone  15 mg Oral Daily  . fluticasone  2 spray Each Nare Daily  . heparin  5,000 Units Subcutaneous Q8H  . levothyroxine  88 mcg Oral QAC breakfast  . loratadine  10 mg Oral Daily  . mirabegron ER  50 mg Oral Daily  . sertraline  100 mg Oral Daily  . sodium chloride flush  3 mL Intravenous Q12H  . traZODone  50-100 mg Oral QHS   Continuous Infusions: . sodium chloride     . sodium chloride 75 mL/hr at 06/06/17 0130  . cefTRIAXone (ROCEPHIN)  IV 2 g (06/06/17 1431)  . metronidazole Stopped (06/06/17 1351)    Assessment/Plan:  1. Ascites, abdominal pain and nausea vomiting.  So far unclear etiology.  Culture of ascites fluid is negative.  Cytology is negative.  Case discussed with gastroenterology and there are no signs of the liver  issue so this is not the cause of the ascites.  The plan will be transferred to [facility] when bed available for laparoscopic procedure.  Patient is on empiric antibiotics but I am not sure when on treating.  Could potentially be an SBP.  Unfortunately they did not send a white blood cell count from the fluid. 2. Acute kidney injury.  Gentle IV fluid hydration.   likely ATN from being on ACE inhibitor 3. Hypothyroidism unspecified 4. Depression and anxiety continue psychiatric medications 5. History of cervical cancer in the past 6. Patient requires intermittent catheterizations.  Code Status:     Code Status Orders  (From admission, onward)        Start     Ordered   06/01/17 1351  Full code  Continuous     06/01/17 1350    Code Status History    Date Active Date Inactive Code Status Order ID Comments User Context   This patient has a current code status but no historical code status.     Family Communication: Husband at the bedside Disposition Plan: To be determined  Antibiotics:  Rocephin  Flagyl  Time spent: 26 minutes  [place]

## 2017-06-07 ENCOUNTER — Inpatient Hospital Stay: Payer: BC Managed Care – PPO

## 2017-06-07 DIAGNOSIS — I82402 Acute embolism and thrombosis of unspecified deep veins of left lower extremity: Secondary | ICD-10-CM

## 2017-06-07 LAB — BASIC METABOLIC PANEL
Anion gap: 11 (ref 5–15)
Anion gap: 10 (ref 5–15)
BUN: 55 mg/dL — ABNORMAL HIGH (ref 6–20)
BUN: 53 mg/dL — ABNORMAL HIGH (ref 6–20)
CALCIUM: 8.9 mg/dL (ref 8.9–10.3)
CHLORIDE: 111 mmol/L (ref 101–111)
CO2: 19 mmol/L — AB (ref 22–32)
CO2: 17 mmol/L — AB (ref 22–32)
CREATININE: 3.8 mg/dL — AB (ref 0.44–1.00)
CREATININE: 3.75 mg/dL — AB (ref 0.44–1.00)
Calcium: 9 mg/dL (ref 8.9–10.3)
Chloride: 110 mmol/L (ref 101–111)
GFR calc non Af Amer: 13 mL/min — ABNORMAL LOW (ref >60)
GFR, EST AFRICAN AMERICAN: 15 mL/min — AB (ref >60)
GFR, EST AFRICAN AMERICAN: 15 mL/min — AB (ref >60)
GFR, EST NON AFRICAN AMERICAN: 13 mL/min — AB (ref >60)
Glucose, Bld: 123 mg/dL — ABNORMAL HIGH (ref 65–99)
Glucose, Bld: 139 mg/dL — ABNORMAL HIGH (ref 65–99)
POTASSIUM: 3.9 mmol/L (ref 3.5–5.1)
Potassium: 4.1 mmol/L (ref 3.5–5.1)
SODIUM: 137 mmol/L (ref 135–145)
Sodium: 141 mmol/L (ref 135–145)

## 2017-06-07 LAB — CBC
HEMATOCRIT: 39.5 % (ref 35.0–47.0)
HEMOGLOBIN: 12.8 g/dL (ref 12.0–16.0)
MCH: 29.2 pg (ref 26.0–34.0)
MCHC: 32.5 g/dL (ref 32.0–36.0)
MCV: 89.8 fL (ref 80.0–100.0)
Platelets: 429 10*3/uL (ref 150–440)
RBC: 4.4 MIL/uL (ref 3.80–5.20)
RDW: 14.6 % — ABNORMAL HIGH (ref 11.5–14.5)
WBC: 12.4 10*3/uL — ABNORMAL HIGH (ref 3.6–11.0)

## 2017-06-07 LAB — HEPARIN LEVEL (UNFRACTIONATED): HEPARIN UNFRACTIONATED: 1.03 [IU]/mL — AB (ref 0.30–0.70)

## 2017-06-07 LAB — PROTIME-INR
INR: 1.22
INR: 1.22
PROTHROMBIN TIME: 15.3 s — AB (ref 11.4–15.2)
Prothrombin Time: 15.3 seconds — ABNORMAL HIGH (ref 11.4–15.2)

## 2017-06-07 LAB — APTT: APTT: 30 s (ref 24–36)

## 2017-06-07 MED ORDER — ENOXAPARIN SODIUM 80 MG/0.8ML ~~LOC~~ SOLN: 1.0000 mg/kg | Freq: Two times a day (BID) | SUBCUTANEOUS | 0 refills | Status: DC

## 2017-06-07 MED ORDER — HEPARIN (PORCINE) IN NACL 100-0.45 UNIT/ML-% IJ SOLN
1000.0000 [IU]/h | INTRAMUSCULAR | Status: DC
  Administered 2017-06-07: 16:00:00 1100 [IU]/h via INTRAVENOUS
  Filled 2017-06-07 (×2): qty 250

## 2017-06-07 MED ORDER — METHYLPREDNISOLONE SODIUM SUCC 40 MG IJ SOLR
40.0000 mg | Freq: Every day | INTRAMUSCULAR | Status: DC
  Administered 2017-06-07: 40 mg via INTRAVENOUS
  Filled 2017-06-07: qty 1

## 2017-06-07 MED ORDER — HEPARIN BOLUS VIA INFUSION
4100.0000 [IU] | Freq: Once | INTRAVENOUS | Status: AC
  Administered 2017-06-07: 16:00:00 4100 [IU] via INTRAVENOUS
  Filled 2017-06-07: qty 4100

## 2017-06-07 MED ORDER — METHYLPREDNISOLONE SODIUM SUCC 40 MG IJ SOLR: 40.0000 mg | Freq: Every day | INTRAMUSCULAR | 0 refills | Status: DC

## 2017-06-07 NOTE — Plan of Care (Signed)
  Progressing Health Behavior/Discharge Planning: Ability to manage health-related needs will improve 06/07/2017 0447 - Progressing by Denice Bors, RN Clinical Measurements: Ability to maintain clinical measurements within normal limits will improve 06/07/2017 0447 - Progressing by Denice Bors, RN Will remain free from infection 06/07/2017 0447 - Progressing by Denice Bors, RN Diagnostic test results will improve 06/07/2017 0447 - Progressing by Denice Bors, RN Cardiovascular complication will be avoided 06/07/2017 0447 - Progressing by Denice Bors, RN Activity: Risk for activity intolerance will decrease 06/07/2017 0447 - Progressing by Denice Bors, RN Nutrition: Adequate nutrition will be maintained 06/07/2017 0447 - Progressing by Denice Bors, RN Coping: Level of anxiety will decrease 06/07/2017 0447 - Progressing by Denice Bors, RN Elimination: Will not experience complications related to urinary retention 06/07/2017 0447 - Progressing by Denice Bors, RN Pain Managment: General experience of comfort will improve 06/07/2017 0447 - Progressing by Denice Bors, RN Safety: Ability to remain free from injury will improve 06/07/2017 0447 - Progressing by Denice Bors, RN   Completed/Met Education: Knowledge of General Education information will improve 06/07/2017 0447 - Completed/Met by Denice Bors, RN Clinical Measurements: Respiratory complications will improve 06/07/2017 0447 - Completed/Met by Denice Bors, RN Elimination: Will not experience complications related to bowel motility 06/07/2017 0447 - Completed/Met by Denice Bors, RN Skin Integrity: Risk for impaired skin integrity will decrease 06/07/2017 0447 - Completed/Met by Denice Bors, RN

## 2017-06-07 NOTE — Progress Notes (Signed)
ANTICOAGULATION CONSULT NOTE - Initial Consult  Pharmacy Consult for Heparin  Indication: DVT  Allergies  Allergen Reactions  . Penicillins Rash    Has patient had a PCN reaction causing immediate rash, facial/tongue/throat swelling, SOB or lightheadedness with hypotension: Yes Has patient had a PCN reaction causing severe rash involving mucus membranes or skin necrosis: No Has patient had a PCN reaction that required hospitalization: No Has patient had a PCN reaction occurring within the last 10 years: Yes If all of the above answers are "NO", then may proceed with Cephalosporin use.  . Sulfa Antibiotics Hives and Rash    Patient Measurements: Height: 5\' 2"  (157.5 cm) Weight: 175 lb (79.4 kg) IBW/kg (Calculated) : 50.1 Heparin Dosing Weight: 69  Vital Signs: Temp: 98.8 F (37.1 C) (01/19 1242) Temp Source: Oral (01/19 1242) BP: 132/80 (01/19 1242) Pulse Rate: 84 (01/19 1242)  Labs: Recent Labs    06/05/17 0409 06/06/17 0514 06/07/17 0508  HGB  --   --  12.8  HCT  --   --  39.5  PLT  --   --  429  CREATININE 2.86* 3.11* 3.80*    Estimated Creatinine Clearance: 17.7 mL/min (A) (by C-G formula based on SCr of 3.8 mg/dL (H)).   Medical History: Past Medical History:  Diagnosis Date  . Anxiety   . Cervical cancer ([place])   . Depression   . DVT (deep venous thrombosis) ([place])   . Hormone disorder   . Hypertension   . Hypothyroidism     Medications:  Medications Prior to Admission  Medication Sig Dispense Refill Last Dose  . busPIRone (BUSPAR) 15 MG tablet Take 15 mg by mouth daily.    05/31/2017 at Unknown time  . cetirizine (ZYRTEC) 10 MG tablet Take 10 mg by mouth daily.    05/31/2017 at Unknown time  . Cholecalciferol (VITAMIN D3) 5000 units TABS Take 1 tablet by mouth daily.    05/31/2017 at Unknown time  . Cranberry 1000 MG CAPS Take 1 capsule by mouth daily.    05/31/2017 at Unknown time  . enalapril (VASOTEC) 5 MG tablet Take 5 mg by mouth daily.    05/31/2017  at Unknown time  . levothyroxine (SYNTHROID, LEVOTHROID) 88 MCG tablet Take 88 mcg by mouth daily before breakfast.    05/31/2017 at Unknown time  . mirabegron ER (MYRBETRIQ) 50 MG TB24 tablet Take 1 tablet (50 mg total) by mouth daily. 30 tablet 11 05/31/2017 at Unknown time  . Probiotic Product (ALOE 93790 & PROBIOTICS PO) Take 1 capsule by mouth daily.    05/31/2017 at Unknown time  . sertraline (ZOLOFT) 100 MG tablet Take 100 mg by mouth daily.    05/31/2017 at Unknown time  . traZODone (DESYREL) 50 MG tablet Take 1-2 tablets by mouth at bedtime.   05/31/2017 at Unknown time  . trimethoprim (TRIMPEX) 100 MG tablet Take 1 tablet (100 mg total) by mouth daily. 30 tablet 11 05/31/2017 at Unknown time  . Catheters (INTERMITTENT 14FR/40CM) MISC Use for urinary self-catheterization 3 times a day as directed   Taking  . fluticasone (FLONASE) 50 MCG/ACT nasal spray Place 2 sprays into both nostrils daily.    prn at prn  . nitrofurantoin, macrocrystal-monohydrate, (MACROBID) 100 MG capsule Take 1 capsule (100 mg total) by mouth 2 (two) times daily. (Patient not taking: Reported on 06/01/2017) 14 capsule 0 Not Taking at Unknown time   Scheduled:  . busPIRone  15 mg Oral Daily  . fluticasone  2 spray  Each Nare Daily  . heparin  4,100 Units Intravenous Once  . levothyroxine  88 mcg Oral QAC breakfast  . loratadine  10 mg Oral Daily  . methylPREDNISolone (SOLU-MEDROL) injection  40 mg Intravenous Daily  . sertraline  100 mg Oral Daily  . sodium chloride flush  3 mL Intravenous Q12H  . traZODone  50-100 mg Oral QHS    Assessment: Pharmacy consulted to dose and monitor heparin in this 47 year old female being treated for DVT of left calf.  Goal of Therapy:  Heparin level 0.3-0.7 units/ml Monitor platelets by anticoagulation protocol: Yes   Plan:  Give 4100 units bolus x 1 Start heparin infusion at 1100 units/hr Check anti-Xa level in 6 hours and daily while on heparin Continue to monitor H&H and  platelets  Avner Stroder D 06/07/2017,3:40 PM

## 2017-06-07 NOTE — Progress Notes (Addendum)
Patient ID: Cynthia Mccarthy, female   DOB: 1970-10-23, 47 y.o.   MRN: [id number]  [facility] PROGRESS NOTE  CHANDEL ZAUN LKG:[id number] DOB: 28-Mar-1971 DOA: 06/01/2017 PCP: Sallee Lange, NP  HPI/Subjective: Patient still not feeling great.  Still with some nausea.  Still with abdominal pain.  Poor appetite.  Objective: Vitals:   06/07/17 0520 06/07/17 1242  BP: (!) 146/86 132/80  Pulse: 88 84  Resp:  20  Temp: 97.6 F (36.4 C) 98.8 F (37.1 C)  SpO2: 95% 97%    Filed Weights   06/01/17 1443  Weight: 79.4 kg (175 lb)    ROS: Review of Systems  Constitutional: Negative for chills and fever.  Eyes: Negative for blurred vision.  Respiratory: Negative for cough and shortness of breath.   Cardiovascular: Negative for chest pain.  Gastrointestinal: Positive for abdominal pain and nausea. Negative for constipation, diarrhea and vomiting.  Genitourinary: Negative for dysuria.  Musculoskeletal: Negative for joint pain.  Neurological: Negative for dizziness and headaches.   Exam: Physical Exam  Constitutional: She is oriented to person, place, and time.  HENT:  Nose: No mucosal edema.  Mouth/Throat: No oropharyngeal exudate or posterior oropharyngeal edema.  Eyes: Conjunctivae, EOM and lids are normal. Pupils are equal, round, and reactive to light.  Neck: No JVD present. Carotid bruit is not present. No edema present. No thyroid mass and no thyromegaly present.  Cardiovascular: S1 normal and S2 normal. Exam reveals no gallop.  No murmur heard. Pulses:      Dorsalis pedis pulses are 2+ on the right side, and 2+ on the left side.  Respiratory: No respiratory distress. She has decreased breath sounds in the right lower field and the left lower field. She has no wheezes. She has no rhonchi. She has no rales.  GI: Soft. Bowel sounds are normal. She exhibits distension. There is tenderness.  Musculoskeletal:       Right ankle: She exhibits no swelling.       Left  ankle: She exhibits no swelling.  Lymphadenopathy:    She has no cervical adenopathy.  Neurological: She is alert and oriented to person, place, and time. No cranial nerve deficit.  Skin: Skin is warm. No rash noted. Nails show no clubbing.  Psychiatric: She has a normal mood and affect.      Data Reviewed: Basic Metabolic Panel: Recent Labs  Lab 06/03/17 0500 06/04/17 0355 06/05/17 0409 06/06/17 0514 06/07/17 0508  NA 136 136 134* 138 141  K 3.9 4.2 3.7 3.7 3.9  CL 107 107 107 112* 111  CO2 19* 17* 17* 17* 19*  GLUCOSE 131* 121* 122* 138* 123*  BUN 40* 48* 50* 55* 55*  CREATININE 2.77* 3.35* 2.86* 3.11* 3.80*  CALCIUM 8.6* 8.4* 8.4* 8.5* 9.0   Liver Function Tests: Recent Labs  Lab 06/01/17 1006 06/02/17 1557 06/03/17 0500  AST 22 18 17   ALT 18 13* 13*  ALKPHOS 44 36* 37*  BILITOT 0.5 0.4 0.5  PROT 7.6 7.0 7.2  ALBUMIN 4.3 3.8 3.8   Recent Labs  Lab 06/01/17 1006  LIPASE 33   CBC: Recent Labs  Lab 06/01/17 1006 06/02/17 0340 06/03/17 0500 06/04/17 0355 06/07/17 0508  WBC 18.8* 13.4* 14.2* 13.0* 12.4*  HGB 14.8 13.0 13.6 13.2 12.8  HCT 45.5 39.7 41.4 40.2 39.5  MCV 87.4 88.1 88.4 88.5 89.8  PLT 412 334 346 369 429     Recent Results (from the past 240 hour(s))  Urine Culture  Status: None   Collection Time: 06/01/17  8:47 AM  Result Value Ref Range Status   Specimen Description   Final    URINE, CLEAN CATCH Performed at [facility], [address] [place], [place] [address]    Special Requests   Final    NONE Performed at [facility], [address] [place], [place] [address]    Culture   Final    NO GROWTH Performed at [facility], [place] [address], [place], [place] [address]    Report Status 06/03/2017 FINAL  Final  Urine Culture     Status: Abnormal   Collection Time: 06/01/17 11:18 AM  Result Value Ref Range Status   Specimen Description   Final    URINE, RANDOM Performed at [facility], [address], [place], [place] [address]    Special Requests   Final    NONE Performed at [facility], [address] [place], [place] [address]    Culture (A)  Final    <10,000 COLONIES/mL INSIGNIFICANT GROWTH Performed at [facility], [place] [address] [place], [place] [address]    Report Status 06/02/2017 FINAL  Final  Aerobic/Anaerobic Culture (surgical/deep wound)     Status: None (Preliminary result)   Collection Time: 06/03/17  1:35 PM  Result Value Ref Range Status   Specimen Description   Final    PERITONEAL Performed at [facility], [address], [place], [place] [address]    Special Requests   Final    NONE Performed at [facility], [address] [place], [place] [address]    Gram Stain   Final    FEW WBC PRESENT, PREDOMINANTLY MONONUCLEAR NO ORGANISMS SEEN    Culture   Final    NO GROWTH 4 DAYS NO ANAEROBES ISOLATED; CULTURE IN PROGRESS FOR 5 DAYS Performed at [facility], [place] [address], [place], [place] [address]    Report Status PENDING  Incomplete  Urine Culture     Status: None   Collection Time: 06/04/17  4:33 PM  Result Value Ref Range Status   Specimen Description   Final    URINE, RANDOM Performed at [facility], [address] [place], [place] [address]    Special Requests   Final    Normal Performed at [facility], [address] [place], [place] [address]    Culture   Final    NO GROWTH Performed at [facility], [place] [address], [place], [place] [address]    Report Status 06/06/2017 FINAL  Final      Scheduled Meds: . busPIRone  15 mg Oral Daily  . fluticasone  2 spray Each Nare Daily  . heparin  5,000 Units Subcutaneous Q8H  . levothyroxine  88 mcg Oral QAC breakfast  . loratadine  10 mg Oral Daily  . methylPREDNISolone (SOLU-MEDROL) injection  40 mg Intravenous Daily  . sertraline  100 mg Oral Daily  . sodium chloride flush  3 mL Intravenous Q12H   . traZODone  50-100 mg Oral QHS   Continuous Infusions: . sodium chloride    . sodium chloride 75 mL/hr at 06/07/17 0616  . cefTRIAXone (ROCEPHIN)  IV 2 g (06/07/17 1415)  . metronidazole 500 mg (06/07/17 1417)    Assessment/Plan:  1. Ascites, abdominal pain and nausea vomiting.  So far unclear etiology.  Culture of ascites fluid is negative.  Cytology is negative.  The  plan will be transferred to [facility] when bed available for laparoscopic procedure.  Patient is on empiric antibiotics but I am not sure when on treating.  2. Acute kidney injury.  Gentle IV fluid hydration.  Creatinine worsened to 3.8 today.  Case discussed with Dr. Candiss Norse nephrology and the patient was started on Solu-Medrol 40 mg daily for possibility of interstitial nephritis. 3. Hypothyroidism unspecified 4. Depression and anxiety continue psychiatric medications 5. History of cervical cancer in the past 6. Patient requires intermittent catheterizations. 7. DVT positive left calf.  Start Lovenox injections because these can be stopped if the patient does have a laparoscopic procedure.  Code Status:     Code Status Orders  (From admission, onward)        Start     Ordered   06/01/17 1351  Full code  Continuous     06/01/17 1350    Code Status History    Date Active Date Inactive Code Status Order ID Comments User Context   This patient has a current code status but no historical code status.     Family Communication: Husband at the bedside Disposition Plan: Transfer to [facility] when bed available  Antibiotics:  Rocephin  Flagyl  Time spent: 25 minutes  [place]

## 2017-06-07 NOTE — Progress Notes (Signed)
Cynthia Mccarthy   DOB:01-05-1971   ZO#:[id number]    Subjective:  She notes to have slight improvement of the abdominal discomfort.  Patient still getting narcotics for pain control.  No nausea no vomiting.  Appetite is poor.  She has been walking to the bathroom/also walking hallways.   She states that she has been using to self cath every time.    Review system: No fever no chills.    Objective:  Vitals:   06/07/17 0520 06/07/17 1242  BP: (!) 146/86 132/80  Pulse: 88 84  Resp:  20  Temp: 97.6 F (36.4 C) 98.8 F (37.1 C)  SpO2: 95% 97%     Intake/Output Summary (Last 24 hours) at 06/07/2017 1735 Last data filed at 06/07/2017 1417 Gross per 24 hour  Intake 1970 ml  Output 400 ml  Net 1570 ml    GENERAL: Well-nourished well-developed; Alert, no distress and comfortable.   With her family. EYES: no pallor or icterus OROPHARYNX: no thrush or ulceration. NECK: supple, no masses felt LYMPH:  no palpable lymphadenopathy in the cervical, axillary or inguinal regions LUNGS: decreased breath sounds to auscultation at bases and  No wheeze or crackles HEART/CVS: regular rate & rhythm and no murmurs; No lower extremity edema ABDOMEN: abdomen soft, tender on deep palpation.  And normal bowel sounds;  Musculoskeletal:no cyanosis of digits and no clubbing  PSYCH: alert & oriented x 3 with fluent speech NEURO: no focal motor/sensory deficits SKIN:  no rashes or significant lesions   Labs:  Lab Results  Component Value Date   WBC 12.4 (H) 06/07/2017   HGB 12.8 06/07/2017   HCT 39.5 06/07/2017   MCV 89.8 06/07/2017   PLT 429 06/07/2017    Lab Results  Component Value Date   NA 141 06/07/2017   K 3.9 06/07/2017   CL 111 06/07/2017   CO2 19 (L) 06/07/2017    Studies:  US Venous Img Lower Bilateral  Result Date: 06/07/2017 CLINICAL DATA:  Bilateral lower extremity edema, prior DVT EXAM: BILATERAL LOWER EXTREMITY VENOUS DOPPLER ULTRASOUND TECHNIQUE: Gray-scale sonography with  graded compression, as well as color Doppler and duplex ultrasound were performed to evaluate the lower extremity deep venous systems from the level of the common femoral vein and including the common femoral, femoral, profunda femoral, popliteal and calf veins including the posterior tibial, peroneal and gastrocnemius veins when visible. The superficial great saphenous vein was also interrogated. Spectral Doppler was utilized to evaluate flow at rest and with distal augmentation maneuvers in the common femoral, femoral and popliteal veins. COMPARISON:  None. FINDINGS: RIGHT LOWER EXTREMITY Common Femoral Vein: No evidence of thrombus. Normal compressibility, respiratory phasicity and response to augmentation. Saphenofemoral Junction: No evidence of thrombus. Normal compressibility and flow on color Doppler imaging. Profunda Femoral Vein: No evidence of thrombus. Normal compressibility and flow on color Doppler imaging. Femoral Vein: No evidence of thrombus. Normal compressibility, respiratory phasicity and response to augmentation. Popliteal Vein: No evidence of thrombus. Normal compressibility, respiratory phasicity and response to augmentation. Calf Veins: No evidence of thrombus. Normal compressibility and flow on color Doppler imaging. Superficial Great Saphenous Vein: No evidence of thrombus. Normal compressibility. Venous Reflux:  None. Other Findings:  None. LEFT LOWER EXTREMITY Common Femoral Vein: No evidence of thrombus. Normal compressibility, respiratory phasicity and response to augmentation. Saphenofemoral Junction: No evidence of thrombus. Normal compressibility and flow on color Doppler imaging. Profunda Femoral Vein: No evidence of thrombus. Normal compressibility and flow on color Doppler imaging. Femoral Vein: No  evidence of thrombus. Normal compressibility, respiratory phasicity and response to augmentation. Popliteal Vein: Hypoechoic intraluminal thrombus noted. Vessel is partially  compressible. Popliteal thrombus is nonocclusive. Phasic flow demonstrated. Calf Veins: Thrombus appears to extend into the calf posterior tibial veins which are noncompressible and appear occlusive. Peroneal veins are grossly patent. Superficial Great Saphenous Vein: No evidence of thrombus. Normal compressibility. Venous Reflux:  None. Other Findings:  None. IMPRESSION: Negative for right lower extremity DVT Positive for left popliteal and calf posterior tibial DVT as above. Electronically Signed   By: Jerilynn Mages.  Shick M.D.   On: 06/07/2017 12:01    Assessment & Plan:   # 47 year old female patient history of stage III cervical cancer-currently admitted to the hospital for worsening abdominal pain noted to have pelvic ascites  # Pelvic ascites moderate to large-question etiology-inflammatory versus malignant [remote history of cervical cancer]. Status post CT-guided aspiration-slight improvement of the pain/distention; not completely resolved. No obvious infection noted on the fluid studies.  # Cervical cancer stage III [as per history; 2003]-treated by chemoradiation.  It is unclear if pelvic ascites is related to recurrence of her cervical cancer [statistically less likely; but still possible].  Cytology negative for malignancy; negative for inflammation. Discussed with Dr.Berchuck; kindly accepted at [facility].  Awaiting bed availability at [facility]  # Left lower extremity DVT-left popliteal/left posterior tibial- [prior history of DVT the question left 2003]-agree with IV heparin.   # CKD-with worsening renal function; question etiology- Creatinine today 3.8; not improving.  Started on steroids.  Ultrasound negative; nephrology following.    Cammie Sickle, MD 06/07/2017  5:35 PM

## 2017-06-07 NOTE — Progress Notes (Addendum)
Spoke with Dr Duane Boston about heparin gtt on transfer.  Will draw PT/INR and call to Dr Duane Boston, then she will decide on whether heparin to be stopped or transferred with drip. Informed pt and spouse of plan. Dorna Bloom RN  PT/INR no change.  PT 15.3, INR 1.22, Heparin unfractionated 1.03. Called updated report to [facility] and informed husband of room number. Updated transfer packet with labs and MAR. Dorna Bloom RN

## 2017-06-07 NOTE — Progress Notes (Signed)
ANTICOAGULATION CONSULT NOTE - Initial Consult  Pharmacy Consult for Heparin  Indication: DVT  Allergies  Allergen Reactions  . Penicillins Rash    Has patient had a PCN reaction causing immediate rash, facial/tongue/throat swelling, SOB or lightheadedness with hypotension: Yes Has patient had a PCN reaction causing severe rash involving mucus membranes or skin necrosis: No Has patient had a PCN reaction that required hospitalization: No Has patient had a PCN reaction occurring within the last 10 years: Yes If all of the above answers are "NO", then may proceed with Cephalosporin use.  . Sulfa Antibiotics Hives and Rash    Patient Measurements: Height: 5\' 2"  (157.5 cm) Weight: 175 lb (79.4 kg) IBW/kg (Calculated) : 50.1 Heparin Dosing Weight: 69  Vital Signs: Temp: 98.1 F (36.7 C) (01/19 2226) Temp Source: Oral (01/19 1242) BP: 140/89 (01/19 2226) Pulse Rate: 89 (01/19 2226)  Labs: Recent Labs    06/06/17 0514 06/07/17 0508 06/07/17 1555 06/07/17 2209  HGB  --  12.8  --   --   HCT  --  39.5  --   --   PLT  --  429  --   --   APTT  --   --  30  --   LABPROT  --   --  15.3* 15.3*  INR  --   --  1.22 1.22  HEPARINUNFRC  --   --   --  1.03*  CREATININE 3.11* 3.80*  --  3.75*    Estimated Creatinine Clearance: 17.9 mL/min (A) (by C-G formula based on SCr of 3.75 mg/dL (H)).   Medical History: Past Medical History:  Diagnosis Date  . Anxiety   . Cervical cancer ([place])   . Depression   . DVT (deep venous thrombosis) ([place])   . Hormone disorder   . Hypertension   . Hypothyroidism     Medications:  Medications Prior to Admission  Medication Sig Dispense Refill Last Dose  . busPIRone (BUSPAR) 15 MG tablet Take 15 mg by mouth daily.    05/31/2017 at Unknown time  . cetirizine (ZYRTEC) 10 MG tablet Take 10 mg by mouth daily.    05/31/2017 at Unknown time  . Cholecalciferol (VITAMIN D3) 5000 units TABS Take 1 tablet by mouth daily.    05/31/2017 at Unknown time  .  Cranberry 1000 MG CAPS Take 1 capsule by mouth daily.    05/31/2017 at Unknown time  . enalapril (VASOTEC) 5 MG tablet Take 5 mg by mouth daily.    05/31/2017 at Unknown time  . levothyroxine (SYNTHROID, LEVOTHROID) 88 MCG tablet Take 88 mcg by mouth daily before breakfast.    05/31/2017 at Unknown time  . mirabegron ER (MYRBETRIQ) 50 MG TB24 tablet Take 1 tablet (50 mg total) by mouth daily. 30 tablet 11 05/31/2017 at Unknown time  . Probiotic Product (ALOE 27062 & PROBIOTICS PO) Take 1 capsule by mouth daily.    05/31/2017 at Unknown time  . sertraline (ZOLOFT) 100 MG tablet Take 100 mg by mouth daily.    05/31/2017 at Unknown time  . traZODone (DESYREL) 50 MG tablet Take 1-2 tablets by mouth at bedtime.   05/31/2017 at Unknown time  . trimethoprim (TRIMPEX) 100 MG tablet Take 1 tablet (100 mg total) by mouth daily. 30 tablet 11 05/31/2017 at Unknown time  . Catheters (INTERMITTENT 14FR/40CM) MISC Use for urinary self-catheterization 3 times a day as directed   Taking  . fluticasone (FLONASE) 50 MCG/ACT nasal spray Place 2 sprays into both nostrils  daily.    prn at prn  . nitrofurantoin, macrocrystal-monohydrate, (MACROBID) 100 MG capsule Take 1 capsule (100 mg total) by mouth 2 (two) times daily. (Patient not taking: Reported on 06/01/2017) 14 capsule 0 Not Taking at Unknown time   Scheduled:  . busPIRone  15 mg Oral Daily  . fluticasone  2 spray Each Nare Daily  . levothyroxine  88 mcg Oral QAC breakfast  . loratadine  10 mg Oral Daily  . methylPREDNISolone (SOLU-MEDROL) injection  40 mg Intravenous Daily  . sertraline  100 mg Oral Daily  . sodium chloride flush  3 mL Intravenous Q12H  . traZODone  50-100 mg Oral QHS    Assessment: Pharmacy consulted to dose and monitor heparin in this 47 year old female being treated for DVT of left calf.  Goal of Therapy:  Heparin level 0.3-0.7 units/ml Monitor platelets by anticoagulation protocol: Yes   Plan:  Give 4100 units bolus x 1 Start heparin  infusion at 1100 units/hr Check anti-Xa level in 6 hours and daily while on heparin Continue to monitor H&H and platelets   01/19 @ 2200 HL 1.03 supratherapeutic. Will drop rate down to 1000 units/hr and will recheck HL/CBC w/ am labs.  Tobie Lords, PharmD, BCPS Clinical Pharmacist 06/07/2017

## 2017-06-07 NOTE — Progress Notes (Signed)
[facility], [place] 06/07/17  Subjective:   Patient is doing fair today.  She is on the list to transfer to [facility] She continues to have some abdominal pain.  She is able to tolerate clears Urine output is fair.  950 cc reported over the last 24 hours No leg edema No shortness of breath serum creatinine has further Increased to 3.80  Objective:  Vital signs in last 24 hours:  Temp:  [97.6 F (36.4 C)-98.8 F (37.1 C)] 98.8 F (37.1 C) (01/19 1242) Pulse Rate:  [77-88] 84 (01/19 1242) Resp:  [18-20] 20 (01/19 1242) BP: (118-146)/(73-86) 132/80 (01/19 1242) SpO2:  [95 %-97 %] 97 % (01/19 1242)  Weight change:  Filed Weights   06/01/17 1443  Weight: 79.4 kg (175 lb)    Intake/Output:    Intake/Output Summary (Last 24 hours) at 06/07/2017 1327 Last data filed at 06/07/2017 0900 Gross per 24 hour  Intake 1970 ml  Output 650 ml  Net 1320 ml     Physical Exam: General:  Appears tired, sitting up in the recliner chair  HEENT  moist oral mucous membranes  Neck  supple  Pulm/lungs  normal breathing effort, clear to auscultation  CVS/Heart  regular rhythm, no rub or gallop  Abdomen:   Soft, mild lower abdominal tenderness, nondistended  Extremities:  Trace peripheral edema  Neurologic:  Alert, oriented  Skin:  Normal turgor, no acute rashes          Basic Metabolic Panel:  Recent Labs  Lab 06/03/17 0500 06/04/17 0355 06/05/17 0409 06/06/17 0514 06/07/17 0508  NA 136 136 134* 138 141  K 3.9 4.2 3.7 3.7 3.9  CL 107 107 107 112* 111  CO2 19* 17* 17* 17* 19*  GLUCOSE 131* 121* 122* 138* 123*  BUN 40* 48* 50* 55* 55*  CREATININE 2.77* 3.35* 2.86* 3.11* 3.80*  CALCIUM 8.6* 8.4* 8.4* 8.5* 9.0     CBC: Recent Labs  Lab 06/01/17 1006 06/02/17 0340 06/03/17 0500 06/04/17 0355 06/07/17 0508  WBC 18.8* 13.4* 14.2* 13.0* 12.4*  HGB 14.8 13.0 13.6 13.2 12.8  HCT 45.5 39.7 41.4 40.2 39.5  MCV 87.4 88.1 88.4 88.5 89.8  PLT 412 334  346 369 429     No results found for: HEPBSAG, HEPBSAB, HEPBIGM    Microbiology:  Recent Results (from the past 240 hour(s))  Urine Culture     Status: None   Collection Time: 06/01/17  8:47 AM  Result Value Ref Range Status   Specimen Description   Final    URINE, CLEAN CATCH Performed at [facility], [address] [place], [place] [address]    Special Requests   Final    NONE Performed at [facility], [address], [place], [place] [address]    Culture   Final    NO GROWTH Performed at [facility], [address] [place], [place] [address]    Report Status 06/03/2017 FINAL  Final  Urine Culture     Status: Abnormal   Collection Time: 06/01/17 11:18 AM  Result Value Ref Range Status   Specimen Description   Final    URINE, RANDOM Performed at [facility], [address], [place], [place] [address]    Special Requests   Final    NONE Performed at [facility], [address] [place], [place] [address]    Culture (A)  Final    <10,000 COLONIES/mL INSIGNIFICANT GROWTH Performed  at [facility], [place] [address] [place], [place] [address]    Report Status 06/02/2017 FINAL  Final  Aerobic/Anaerobic Culture (surgical/deep wound)     Status: None (Preliminary result)   Collection Time: 06/03/17  1:35 PM  Result Value Ref Range Status   Specimen Description   Final    PERITONEAL Performed at [facility], [address], [place], [place] [address]    Special Requests   Final    NONE Performed at [facility], [address] [place], [place] [address]    Gram Stain   Final    FEW WBC PRESENT, PREDOMINANTLY MONONUCLEAR NO ORGANISMS SEEN    Culture   Final    NO GROWTH 3 DAYS NO ANAEROBES ISOLATED; CULTURE IN PROGRESS FOR 5 DAYS Performed at [facility], [place] [address] [place], [place] [address]    Report Status PENDING  Incomplete  Urine Culture     Status: None    Collection Time: 06/04/17  4:33 PM  Result Value Ref Range Status   Specimen Description   Final    URINE, RANDOM Performed at [facility], [address] [place], [place] [address]    Special Requests   Final    Normal Performed at [facility], [address] [place], [place] [address]    Culture   Final    NO GROWTH Performed at [facility], [place] [address], [place], [place] [address]    Report Status 06/06/2017 FINAL  Final    Coagulation Studies: No results for input(s): LABPROT, INR in the last 72 hours.  Urinalysis: No results for input(s): COLORURINE, LABSPEC, PHURINE, GLUCOSEU, HGBUR, BILIRUBINUR, KETONESUR, PROTEINUR, UROBILINOGEN, NITRITE, LEUKOCYTESUR in the last 72 hours.  Invalid input(s): APPERANCEUR    Imaging: US Venous Img Lower Bilateral  Result Date: 06/07/2017 CLINICAL DATA:  Bilateral lower extremity edema, prior DVT EXAM: BILATERAL LOWER EXTREMITY VENOUS DOPPLER ULTRASOUND TECHNIQUE: Gray-scale sonography with graded compression, as well as color Doppler and duplex ultrasound were performed to evaluate the lower extremity deep venous systems from the level of the common femoral vein and including the common femoral, femoral, profunda femoral, popliteal and calf veins including the posterior tibial, peroneal and gastrocnemius veins when visible. The superficial great saphenous vein was also interrogated. Spectral Doppler was utilized to evaluate flow at rest and with distal augmentation maneuvers in the common femoral, femoral and popliteal veins. COMPARISON:  None. FINDINGS: RIGHT LOWER EXTREMITY Common Femoral Vein: No evidence of thrombus. Normal compressibility, respiratory phasicity and response to augmentation. Saphenofemoral Junction: No evidence of thrombus. Normal compressibility and flow on color Doppler imaging. Profunda Femoral Vein: No evidence of thrombus. Normal compressibility and flow on color Doppler imaging. Femoral  Vein: No evidence of thrombus. Normal compressibility, respiratory phasicity and response to augmentation. Popliteal Vein: No evidence of thrombus. Normal compressibility, respiratory phasicity and response to augmentation. Calf Veins: No evidence of thrombus. Normal compressibility and flow on color Doppler imaging. Superficial Great Saphenous Vein: No evidence of thrombus. Normal compressibility. Venous Reflux:  None. Other Findings:  None. LEFT LOWER EXTREMITY Common Femoral Vein: No evidence of thrombus. Normal compressibility, respiratory phasicity and response to augmentation. Saphenofemoral Junction: No evidence of thrombus. Normal compressibility and flow on color Doppler imaging. Profunda Femoral Vein: No evidence of thrombus. Normal compressibility and flow on color Doppler imaging. Femoral Vein: No evidence of thrombus. Normal compressibility, respiratory phasicity and response to augmentation. Popliteal Vein: Hypoechoic intraluminal thrombus noted. Vessel is  partially compressible. Popliteal thrombus is nonocclusive. Phasic flow demonstrated. Calf Veins: Thrombus appears to extend into the calf posterior tibial veins which are noncompressible and appear occlusive. Peroneal veins are grossly patent. Superficial Great Saphenous Vein: No evidence of thrombus. Normal compressibility. Venous Reflux:  None. Other Findings:  None. IMPRESSION: Negative for right lower extremity DVT Positive for left popliteal and calf posterior tibial DVT as above. Electronically Signed   By: Jerilynn Mages.  Shick M.D.   On: 06/07/2017 12:01     Medications:   . sodium chloride    . sodium chloride 75 mL/hr at 06/07/17 0616  . cefTRIAXone (ROCEPHIN)  IV Stopped (06/06/17 1501)  . metronidazole Stopped (06/07/17 8099)   . busPIRone  15 mg Oral Daily  . fluticasone  2 spray Each Nare Daily  . heparin  5,000 Units Subcutaneous Q8H  . levothyroxine  88 mcg Oral QAC breakfast  . loratadine  10 mg Oral Daily  . methylPREDNISolone  (SOLU-MEDROL) injection  40 mg Intravenous Daily  . sertraline  100 mg Oral Daily  . sodium chloride flush  3 mL Intravenous Q12H  . traZODone  50-100 mg Oral QHS   sodium chloride, acetaminophen **OR** acetaminophen, albuterol, alum & mag hydroxide-simeth, bisacodyl, HYDROmorphone, ondansetron **OR** ondansetron (ZOFRAN) IV, oxyCODONE-acetaminophen, senna-docusate, sodium chloride flush  Assessment/ Plan:  47 y.o. female with medical problems of hypertension, hypothyroidism, neurogenic bladder requiring intermittent self caths, hx of cervical cancer in 2003 treated with chemo and radiation, previous hysterectomy who was admitted to [facility] on 06/01/2017 for evaluation of lower abdominal pain with nausea.   1.  Acute Renal failure. Baseline creatinine is unknown.Creatinine 1.0 in 02/2016)  Admit Cr is 1.63.  Creatinine and BUN have increased since admission.  Today's Creatinine has further increased to 3.80  Acute renal failure is likely secondary to ATN due to volume shifts in the setting of taking ACE-I.  Differential includes interstitial nephritis.  We will discontinue Myrbetriq.  Empirically start Solu-Medrol 40 mg daily  urine protein to creatinine ratio is 2.01 Electrolytes and Volume status are acceptable No acute indication for Dialysis at present   2. Pelvic ascites. Unknown etiology.  Paracentesis performed  Managed by hospitalist and GI teams.   3. Neurogenic bladder with frequent UTIs Requires intermittent self caths     LOS: 6 Elizer Bostic [facility] 1/19/20191:27 PM  [facility] [place], [place]

## 2017-06-07 NOTE — Progress Notes (Signed)
Heparin rate changed to 1000 units/hr per pharmacy order. Jacelyn Pi second verifier. Dorna Bloom RN

## 2017-06-08 LAB — AEROBIC/ANAEROBIC CULTURE (SURGICAL/DEEP WOUND)

## 2017-06-08 LAB — AEROBIC/ANAEROBIC CULTURE W GRAM STAIN (SURGICAL/DEEP WOUND): Culture: NO GROWTH

## 2017-06-08 MED ORDER — FLUTICASONE PROPIONATE 50 MCG/ACT NA SUSP
2.00 | NASAL | Status: DC
Start: 2017-06-09 — End: 2017-06-08

## 2017-06-08 MED ORDER — HEPARIN (PORCINE) IN NACL 100-0.45 UNIT/ML-% IJ SOLN
1400.00 | INTRAMUSCULAR | Status: DC
Start: ? — End: 2017-06-08

## 2017-06-08 MED ORDER — FEXOFENADINE HCL 60 MG PO TABS
60.00 | ORAL_TABLET | ORAL | Status: DC
Start: 2017-06-08 — End: 2017-06-08

## 2017-06-08 MED ORDER — DOCUSATE SODIUM 100 MG PO CAPS
100.00 | ORAL_CAPSULE | ORAL | Status: DC
Start: 2017-06-08 — End: 2017-06-08

## 2017-06-08 MED ORDER — GENERIC EXTERNAL MEDICATION
5000.00 | Status: DC
Start: 2017-06-09 — End: 2017-06-08

## 2017-06-08 MED ORDER — SERTRALINE HCL 100 MG PO TABS
100.00 | ORAL_TABLET | ORAL | Status: DC
Start: 2017-06-09 — End: 2017-06-08

## 2017-06-08 MED ORDER — LIDOCAINE HCL 1 % IJ SOLN
.50 | INTRAMUSCULAR | Status: DC
Start: ? — End: 2017-06-08

## 2017-06-08 MED ORDER — DIPHENHYDRAMINE HCL 25 MG PO CAPS
25.00 | ORAL_CAPSULE | ORAL | Status: DC
Start: ? — End: 2017-06-08

## 2017-06-08 MED ORDER — BUSPIRONE HCL 10 MG PO TABS
10.00 | ORAL_TABLET | ORAL | Status: DC
Start: 2017-06-09 — End: 2017-06-08

## 2017-06-08 MED ORDER — TRAZODONE HCL 50 MG PO TABS
25.00 | ORAL_TABLET | ORAL | Status: DC
Start: ? — End: 2017-06-08

## 2017-06-08 MED ORDER — ENALAPRIL MALEATE 5 MG PO TABS
5.00 | ORAL_TABLET | ORAL | Status: DC
Start: 2017-06-09 — End: 2017-06-08

## 2017-06-08 MED ORDER — OXYCODONE HCL 5 MG PO TABS
5.00 | ORAL_TABLET | ORAL | Status: DC
Start: ? — End: 2017-06-08

## 2017-06-08 MED ORDER — LEVOTHYROXINE SODIUM 88 MCG PO TABS
88.00 | ORAL_TABLET | ORAL | Status: DC
Start: 2017-06-09 — End: 2017-06-08

## 2017-06-08 MED ORDER — ACETAMINOPHEN 325 MG PO TABS
650.00 | ORAL_TABLET | ORAL | Status: DC
Start: ? — End: 2017-06-08

## 2017-06-08 MED ORDER — HEPARIN (PORCINE) IN NACL 100-0.45 UNIT/ML-% IJ SOLN: 1000.0000 [IU]/h | INTRAMUSCULAR | Status: DC

## 2017-06-08 NOTE — Progress Notes (Signed)
Dr. Estanislado Pandy paged about doing med reconciliation for pt who discharges to [facility] at 02:00 this am . Per Dr. Estanislado Pandy med reconciliation will have to be done in the am when day shift arrives. Pt will need to be taken out of the system after reconciliation done.

## 2017-06-08 NOTE — Progress Notes (Signed)
EMS arrived.  Changed heparin drip to their pump with new bag.  Confirmed dose at 1000 units /hour.  Gave pt her pain medication early so that she could be comfortable during transition. Pain 6/10 at this time. Discharged to EMS on stretcher. Dorna Bloom RN

## 2017-07-21 ENCOUNTER — Ambulatory Visit: Payer: BC Managed Care – PPO | Admitting: Urology

## 2017-09-18 ENCOUNTER — Encounter: Payer: Self-pay | Admitting: Psychology

## 2017-11-24 ENCOUNTER — Other Ambulatory Visit: Payer: Self-pay | Admitting: Neurology

## 2017-11-24 ENCOUNTER — Other Ambulatory Visit (HOSPITAL_COMMUNITY): Payer: Self-pay | Admitting: Neurology

## 2017-11-24 DIAGNOSIS — R413 Other amnesia: Secondary | ICD-10-CM

## 2017-11-29 ENCOUNTER — Ambulatory Visit (HOSPITAL_COMMUNITY): Payer: BC Managed Care – PPO

## 2017-12-09 ENCOUNTER — Encounter (HOSPITAL_COMMUNITY): Payer: Self-pay | Admitting: Radiology

## 2017-12-09 ENCOUNTER — Ambulatory Visit (HOSPITAL_COMMUNITY)
Admission: RE | Admit: 2017-12-09 | Discharge: 2017-12-09 | Disposition: A | Discharge status: Home / Self Care | Payer: BC Managed Care – PPO | Source: Ambulatory Visit | Attending: Neurology | Admitting: Neurology

## 2017-12-09 DIAGNOSIS — R413 Other amnesia: Secondary | ICD-10-CM | POA: Diagnosis present

## 2018-02-10 ENCOUNTER — Encounter: Payer: BC Managed Care – PPO | Admitting: Psychology

## 2018-02-26 ENCOUNTER — Encounter: Payer: BC Managed Care – PPO | Admitting: Psychology

## 2018-03-17 ENCOUNTER — Encounter: Payer: BC Managed Care – PPO | Admitting: Psychology

## 2018-07-16 ENCOUNTER — Other Ambulatory Visit: Payer: Self-pay | Admitting: Nurse Practitioner

## 2018-07-16 DIAGNOSIS — Z1231 Encounter for screening mammogram for malignant neoplasm of breast: Secondary | ICD-10-CM

## 2018-08-25 ENCOUNTER — Encounter: Payer: BC Managed Care – PPO | Admitting: Psychology

## 2018-12-07 DIAGNOSIS — M2550 Pain in unspecified joint: Secondary | ICD-10-CM | POA: Insufficient documentation

## 2019-02-01 ENCOUNTER — Other Ambulatory Visit: Payer: Self-pay | Admitting: Pulmonary Disease

## 2019-02-01 DIAGNOSIS — R0609 Other forms of dyspnea: Secondary | ICD-10-CM

## 2019-02-12 ENCOUNTER — Ambulatory Visit
Admission: RE | Admit: 2019-02-12 | Discharge: 2019-02-12 | Disposition: A | Discharge status: Home / Self Care | Payer: BC Managed Care – PPO | Source: Ambulatory Visit | Attending: Pulmonary Disease | Admitting: Pulmonary Disease

## 2019-02-12 ENCOUNTER — Encounter
Admission: RE | Admit: 2019-02-12 | Discharge: 2019-02-12 | Disposition: A | Discharge status: Home / Self Care | Payer: BC Managed Care – PPO | Source: Ambulatory Visit | Attending: Pulmonary Disease | Admitting: Pulmonary Disease

## 2019-02-12 ENCOUNTER — Other Ambulatory Visit: Payer: Self-pay

## 2019-02-12 DIAGNOSIS — R0609 Other forms of dyspnea: Secondary | ICD-10-CM | POA: Diagnosis present

## 2019-02-12 MED ORDER — TECHNETIUM TO 99M ALBUMIN AGGREGATED
4.0000 | Freq: Once | INTRAVENOUS | Status: AC | PRN
  Administered 2019-02-12: 12:00:00 3.97 via INTRAVENOUS

## 2019-02-12 MED ORDER — TECHNETIUM TC 99M DIETHYLENETRIAME-PENTAACETIC ACID
30.0000 | Freq: Once | INTRAVENOUS | Status: AC | PRN
  Administered 2019-02-12: 32.39 via RESPIRATORY_TRACT

## 2019-06-08 DIAGNOSIS — N1831 Chronic kidney disease, stage 3a: Secondary | ICD-10-CM | POA: Diagnosis present

## 2019-07-17 ENCOUNTER — Ambulatory Visit: Payer: BC Managed Care – PPO | Attending: Internal Medicine

## 2019-07-17 DIAGNOSIS — Z23 Encounter for immunization: Secondary | ICD-10-CM | POA: Insufficient documentation

## 2019-07-17 NOTE — Progress Notes (Signed)
   Covid-19 Vaccination Clinic  Name:  LOETTE FAUERBACH    MRN: [id number] DOB: 11-Mar-1971  07/17/2019  Ms. Gudiel was observed post Covid-19 immunization for 15 minutes without incidence. She was provided with Vaccine Information Sheet and instruction to access the V-Safe system.   Ms. Motz was instructed to call 911 with any severe reactions post vaccine: Marland Kitchen Difficulty breathing  . Swelling of your face and throat  . A fast heartbeat  . A bad rash all over your body  . Dizziness and weakness    Immunizations Administered    Name Date Dose VIS Date Route   Moderna COVID-19 Vaccine 07/17/2019 10:44 AM 0.5 mL 04/20/2019 Intramuscular   Manufacturer: Moderna   Lot: [id number]   [place]:[id number]

## 2019-07-19 ENCOUNTER — Other Ambulatory Visit: Payer: Self-pay | Admitting: Nurse Practitioner

## 2019-07-19 DIAGNOSIS — Z1231 Encounter for screening mammogram for malignant neoplasm of breast: Secondary | ICD-10-CM

## 2019-08-14 ENCOUNTER — Ambulatory Visit: Payer: BC Managed Care – PPO | Attending: Internal Medicine

## 2019-08-14 ENCOUNTER — Ambulatory Visit: Payer: BC Managed Care – PPO

## 2019-08-14 DIAGNOSIS — Z23 Encounter for immunization: Secondary | ICD-10-CM

## 2019-08-14 NOTE — Progress Notes (Signed)
   Covid-19 Vaccination Clinic  Name:  Cynthia Mccarthy    MRN: [id number] DOB: May 06, 1971  08/14/2019  [name] was observed post Covid-19 immunization for 15 minutes without incident. She was provided with Vaccine Information Sheet and instruction to access the V-Safe system.   [name] was instructed to call 911 with any severe reactions post vaccine: Marland Kitchen Difficulty breathing  . Swelling of face and throat  . A fast heartbeat  . A bad rash all over body  . Dizziness and weakness   Immunizations Administered    Name Date Dose VIS Date Route   Moderna COVID-19 Vaccine 08/14/2019  2:16 PM 0.5 mL 04/20/2019 Intramuscular   Manufacturer: Levan Hurst   LotUD:[id number]   [place]BE:[id number]

## 2019-08-17 ENCOUNTER — Ambulatory Visit: Payer: BC Managed Care – PPO

## 2020-06-15 ENCOUNTER — Other Ambulatory Visit: Payer: Self-pay | Admitting: Nurse Practitioner

## 2020-06-15 DIAGNOSIS — Z1231 Encounter for screening mammogram for malignant neoplasm of breast: Secondary | ICD-10-CM

## 2020-07-13 ENCOUNTER — Ambulatory Visit
Admission: RE | Admit: 2020-07-13 | Discharge: 2020-07-13 | Disposition: A | Discharge status: Home / Self Care | Payer: BC Managed Care – PPO | Source: Ambulatory Visit | Attending: Nurse Practitioner | Admitting: Nurse Practitioner

## 2020-07-13 ENCOUNTER — Other Ambulatory Visit: Payer: Self-pay

## 2020-07-13 DIAGNOSIS — Z1231 Encounter for screening mammogram for malignant neoplasm of breast: Secondary | ICD-10-CM | POA: Insufficient documentation

## 2020-07-18 DIAGNOSIS — E119 Type 2 diabetes mellitus without complications: Secondary | ICD-10-CM

## 2021-08-29 ENCOUNTER — Ambulatory Visit
Admission: RE | Admit: 2021-08-29 | Discharge: 2021-08-29 | Disposition: A | Discharge status: Home / Self Care | Payer: BC Managed Care – PPO | Attending: Physician Assistant | Admitting: Physician Assistant

## 2021-08-29 ENCOUNTER — Encounter: Payer: Self-pay | Admitting: Emergency Medicine

## 2021-08-29 ENCOUNTER — Ambulatory Visit
Admission: EM | Admit: 2021-08-29 | Discharge: 2021-08-29 | Disposition: A | Discharge status: Home / Self Care | Payer: BC Managed Care – PPO | Attending: Urgent Care | Admitting: Urgent Care

## 2021-08-29 ENCOUNTER — Ambulatory Visit
Admission: RE | Admit: 2021-08-29 | Discharge: 2021-08-29 | Disposition: A | Discharge status: Home / Self Care | Payer: BC Managed Care – PPO | Source: Ambulatory Visit | Attending: Urgent Care | Admitting: Urgent Care

## 2021-08-29 DIAGNOSIS — R109 Unspecified abdominal pain: Secondary | ICD-10-CM | POA: Diagnosis present

## 2021-08-29 LAB — POCT URINALYSIS DIP (MANUAL ENTRY)
Bilirubin, UA: NEGATIVE
Blood, UA: NEGATIVE
Glucose, UA: NEGATIVE mg/dL
Ketones, POC UA: NEGATIVE mg/dL
Leukocytes, UA: NEGATIVE
Nitrite, UA: NEGATIVE
Protein Ur, POC: NEGATIVE mg/dL
Spec Grav, UA: <=1.005 — AB (ref 1.010–1.025)
Urobilinogen, UA: 0.2 E.U./dL
pH, UA: 5.5 (ref 5.0–8.0)

## 2021-08-29 NOTE — ED Triage Notes (Signed)
Pt was seen at an Urgent Care on 3/31 for kidney infection she is not better. She has back pain, nausea, and urgency.  ?

## 2021-08-29 NOTE — Discharge Instructions (Addendum)
Given your longstanding history of urinary issues, I am concerned that you may possibly have kidney stones. ?Your urine sample looks normal in our office. We will send it out for a urine culture and treat if positive. ?You need a workup for this, including a KUB xray and possibly an ultrasound. Additional lab testing can be performed. ?Due to your sulfa allergy, we are unable to prescribe flomax.  ? ?

## 2021-08-29 NOTE — ED Provider Notes (Signed)
?[facility] ? ? ? ?CSN: [id number] ?Arrival date & time: 08/29/21  1556 ? ? ?  ? ?History   ?Chief Complaint ?Chief Complaint  ?Patient presents with  ? Back Pain  ? Nausea  ? Urinary Urgency   ? ? ?HPI ?Cynthia Mccarthy is a 51 y.o. female.  ? ?Pleasant 51yo female with a hx of urostomy secondary to cystectomy and radical hysterectomy 20 years ago presents today with complaints of continued back pain, bilaterally, but worse on the R. She was seen on 08/17/21 at her PCP and dx with a UTI. She was given IM rocephin on 3/31 and sent home with 10 days of '500mg'$  ciprofloxacin. Her urine cx confirmed proteus mirabilus, and was sensitive to the abx prescribed. Pt states her back pain did not improve with the treatment provided. It is worse with laying flat and better sitting up. She denies fever. She endorses diarrhea and nausea, but states that started the day she started her abx. She does endorse some mild abdominal discomfort, but no vomiting. She has not had a fever. She does work lifting heavy things, but does not feel like this is her back. She admits that when she hooks up her urostomy at night to the "connector tube" that it is caked with calcium deposits each morning. She denies any additional complaints. ? ? ?Back Pain ? ?Past Medical History:  ?Diagnosis Date  ? Anxiety   ? Cervical cancer ([place])   ? Depression   ? DVT (deep venous thrombosis) ([place])   ? Hormone disorder   ? Hypertension   ? Hypothyroidism   ? ? ?Patient Active Problem List  ? Diagnosis Date Noted  ? Ascites 06/01/2017  ? ? ?Past Surgical History:  ?Procedure Laterality Date  ? ABDOMINAL HYSTERECTOMY    ? ? ?OB History   ?No obstetric history on file. ?  ? ? ? ?Home Medications   ? ?Prior to Admission medications   ?Medication Sig Start Date End Date Taking? Authorizing Provider  ?busPIRone (BUSPAR) 15 MG tablet Take 15 mg by mouth daily.  01/29/17  Yes [provider]  ?cetirizine (ZYRTEC) 10 MG tablet Take 10 mg by mouth daily.     Yes [provider]  ?levothyroxine (SYNTHROID, LEVOTHROID) 88 MCG tablet Take 88 mcg by mouth daily before breakfast.  02/05/17  Yes [provider]  ?traZODone (DESYREL) 50 MG tablet Take 1-2 tablets by mouth at bedtime.   Yes [provider]  ?acetaminophen (TYLENOL) 325 MG tablet Take 2 tablets (650 mg total) by mouth every 6 (six) hours as needed for mild pain (or Fever >/= 101). 06/05/17   Loletha Grayer, MD  ?albuterol (PROVENTIL) (2.5 MG/3ML) 0.083% nebulizer solution Take 3 mLs (2.5 mg total) by nebulization every 2 (two) hours as needed for wheezing. 06/05/17   Loletha Grayer, MD  ?Catheters (INTERMITTENT 14FR/40CM) MISC Use for urinary self-catheterization 3 times a day as directed 09/21/14   [provider]  ?citalopram (CELEXA) 40 MG tablet Take 40 mg by mouth daily. 06/19/21   [provider]  ?fluticasone (FLONASE) 50 MCG/ACT nasal spray Place 2 sprays into both nostrils daily.     [provider]  ?metFORMIN (GLUCOPHAGE) 500 MG tablet Take 500 mg by mouth daily. 08/10/21   [provider]  ?ondansetron (ZOFRAN) 4 MG tablet Take 1 tablet (4 mg total) by mouth every 6 (six) hours as needed for nausea. 06/05/17   Loletha Grayer, MD  ?Probiotic Product (ALOE 51884 &  PROBIOTICS PO) Take 1 capsule by mouth daily.     [provider]  ?rosuvastatin (CRESTOR) 5 MG tablet Take 5 mg by mouth daily. 08/10/21   [provider]  ?sertraline (ZOLOFT) 100 MG tablet Take 100 mg by mouth daily.     [provider]  ?sodium chloride 0.9 % infusion Inject 75 mLs into the vein continuous. 06/05/17   Loletha Grayer, MD  ?Dellis Anes 160-4.5 MCG/ACT inhaler Inhale into the lungs. 08/03/21   [provider]  ?vitamin B-12 (CYANOCOBALAMIN) 1000 MCG tablet Take by mouth. 03/29/21   [provider]  ? ? ?Family History ?Family History  ?Problem Relation Age of Onset  ? Bladder Cancer Neg Hx   ? Kidney cancer Neg Hx    ? ? ?Social History ?Social History  ? ?Tobacco Use  ? Smoking status: Never  ? Smokeless tobacco: Never  ?Vaping Use  ? Vaping Use: Never used  ?Substance Use Topics  ? Alcohol use: Yes  ? Drug use: No  ? ? ? ?Allergies   ?Penicillins, Melatonin, and Sulfa antibiotics ? ? ?Review of Systems ?Review of Systems  ?Gastrointestinal:  Positive for diarrhea and nausea.  ?Musculoskeletal:  Positive for back pain.  ? ? ?Physical Exam ?Triage Vital Signs ?ED Triage Vitals  ?Enc Vitals Group  ?   BP 08/29/21 1631 135/86  ?   Pulse Rate 08/29/21 1631 77  ?   Resp 08/29/21 1631 18  ?   Temp 08/29/21 1631 98.9 ?F (37.2 ?C)  ?   Temp Source 08/29/21 1631 Oral  ?   SpO2 08/29/21 1631 96 %  ?   Weight --   ?   Height --   ?   Head Circumference --   ?   Peak Flow --   ?   Pain Score 08/29/21 1626 4  ?   Pain Loc --   ?   Pain Edu? --   ?   Excl. in [place]? --   ? ?No data found. ? ?Updated Vital Signs ?BP 135/86 (BP Location: Right Arm)   Pulse 77   Temp 98.9 ?F (37.2 ?C) (Oral)   Resp 18   SpO2 96%  ? ?Visual Acuity ?Right Eye Distance:   ?Left Eye Distance:   ?Bilateral Distance:   ? ?Right Eye Near:   ?Left Eye Near:    ?Bilateral Near:    ? ?Physical Exam ?Vitals and nursing note reviewed.  ?Constitutional:   ?   General: She is not in acute distress. ?   Appearance: Normal appearance. She is well-developed. She is obese. She is not ill-appearing, toxic-appearing or diaphoretic.  ?HENT:  ?   Head: Normocephalic and atraumatic.  ?   Nose: No congestion or rhinorrhea.  ?   Mouth/Throat:  ?   Mouth: Mucous membranes are moist.  ?   Pharynx: No oropharyngeal exudate or posterior oropharyngeal erythema.  ?Eyes:  ?   Conjunctiva/sclera: Conjunctivae normal.  ?Cardiovascular:  ?   Rate and Rhythm: Normal rate and regular rhythm.  ?   Pulses: Normal pulses.  ?   Heart sounds: Normal heart sounds. No murmur heard. ?Pulmonary:  ?   Effort: Pulmonary effort is normal. No respiratory distress.  ?   Breath sounds: Normal breath sounds. No  stridor. No wheezing, rhonchi or rales.  ?Chest:  ?   Chest wall: No tenderness.  ?Abdominal:  ?   General: Abdomen is flat. Bowel sounds are normal. There is no distension.  ?  Palpations: Abdomen is soft. There is no mass.  ?   Tenderness: There is no abdominal tenderness (mild generalized tenderness, slightly more pronounced to the RUQ, but negative murphy sign). There is right CVA tenderness (minimal). There is no left CVA tenderness, guarding or rebound.  ?   Hernia: No hernia is present.  ?Musculoskeletal:     ?   General: No swelling.  ?   Cervical back: Normal range of motion and neck supple. No rigidity or tenderness.  ?Lymphadenopathy:  ?   Cervical: No cervical adenopathy.  ?Skin: ?   General: Skin is warm and dry.  ?   Capillary Refill: Capillary refill takes less than 2 seconds.  ?Neurological:  ?   General: No focal deficit present.  ?   Mental Status: She is alert and oriented to person, place, and time.  ?Psychiatric:     ?   Mood and Affect: Mood normal.     ?   Behavior: Behavior normal.  ? ? ? ?UC Treatments / Results  ?Labs ?(all labs ordered are listed, but only abnormal results are displayed) ?Labs Reviewed  ?POCT URINALYSIS DIP (MANUAL ENTRY) - Abnormal; Notable for the following components:  ?    Result Value  ? Spec Grav, UA <=1.005 (*)   ? All other components within normal limits  ? ? ?EKG ? ? ?Radiology ?DG Abd 2 Views ? ?Result Date: 08/29/2021 ?CLINICAL DATA:  R flank pain x 2 weeks, concern for nephrolithiasis EXAM: ABDOMEN - 2 VIEW COMPARISON:  06/01/2017 CT abdomen/pelvis FINDINGS: Numerous surgical clips overlie the lower quadrants and bilateral deep pelvis. No radiopaque stones overlie the kidneys or expected locations of the ureters or bladder. No dilated small bowel loops. Mild colonic stool and gas. No evidence of pneumatosis or pneumoperitoneum. Clear lung bases. Mild levocurvature of the lumbar spine. Mild-to-moderate lumbar spondylosis. IMPRESSION: No radiopaque  nephrolithiasis.  Nonobstructive bowel gas pattern. Electronically Signed   By: Ilona Sorrel M.D.   On: 08/29/2021 18:19   ? ?Procedures ?Procedures (including critical care time) ? ?Medications Ordered in UC ?Medications

## 2021-11-02 ENCOUNTER — Other Ambulatory Visit: Payer: Self-pay | Admitting: Nurse Practitioner

## 2021-11-02 DIAGNOSIS — Z1231 Encounter for screening mammogram for malignant neoplasm of breast: Secondary | ICD-10-CM

## 2022-02-06 ENCOUNTER — Ambulatory Visit
Admission: EM | Admit: 2022-02-06 | Discharge: 2022-02-06 | Disposition: A | Discharge status: Home / Self Care | Payer: BC Managed Care – PPO

## 2022-02-06 ENCOUNTER — Encounter: Payer: Self-pay | Admitting: Emergency Medicine

## 2022-02-06 DIAGNOSIS — R21 Rash and other nonspecific skin eruption: Secondary | ICD-10-CM

## 2022-02-06 DIAGNOSIS — W57XXXA Bitten or stung by nonvenomous insect and other nonvenomous arthropods, initial encounter: Secondary | ICD-10-CM | POA: Diagnosis not present

## 2022-02-06 DIAGNOSIS — S70362A Insect bite (nonvenomous), left thigh, initial encounter: Secondary | ICD-10-CM | POA: Diagnosis not present

## 2022-02-06 HISTORY — DX: Type 2 diabetes mellitus without complications: E11.9

## 2022-02-06 MED ORDER — TRIAMCINOLONE ACETONIDE 0.5 % EX OINT: 1.0000 | TOPICAL_OINTMENT | Freq: Two times a day (BID) | CUTANEOUS | 0 refills | Status: DC

## 2022-02-06 NOTE — ED Triage Notes (Signed)
Pt has insect bites on her left ankle and knee. Started yesterday. Area is red, swollen and itchy.

## 2022-02-06 NOTE — Discharge Instructions (Addendum)
-  Rash is consistent with insect bites.  Apply ice to the area to help with swelling.  Also elevate extremity. - I have sent a topical corticosteroid ointment to apply to the rash as well. -Consider antihistamines.  Try nondrowsy Claritin during the day, Benadryl at night. - Keep the area clean with soap and water and monitor for signs of infection including increased pain, swelling, fever, pustular drainage.  If that occurs, please return for reevaluation.

## 2022-02-06 NOTE — ED Provider Notes (Signed)
[facility]    CSN: [id number] Arrival date & time: 02/06/22  1406      History   Chief Complaint Chief Complaint  Patient presents with   Insect Bite    HPI Cynthia Mccarthy is a 51 y.o. female presenting for reddened swollen, pruritic rash of the left thigh and left ankle since yesterday.  Patient reports she was taking trash out at work and felt something bite or sting her.  She reports she has cleaned the area with alcohol.  Applied Neosporin.  She reports that she is diabetic and is concerned about potential infection.  She denies any associated pain.  There has been a little bit of drainage which she says has been clear.  No associated fever, aches or pains.  Unsure as to what may have bitten or stung her.  Has no other concerns.  HPI  Past Medical History:  Diagnosis Date   Anxiety    Cervical cancer ([place])    Depression    Diabetes mellitus without complication ([place])    DVT (deep venous thrombosis) ([place])    Hormone disorder    Hypertension    Hypothyroidism     Patient Active Problem List   Diagnosis Date Noted   Ascites 06/01/2017    Past Surgical History:  Procedure Laterality Date   ABDOMINAL HYSTERECTOMY      OB History   No obstetric history on file.      Home Medications    Prior to Admission medications   Medication Sig Start Date End Date Taking? Authorizing Provider  busPIRone (BUSPAR) 15 MG tablet Take 15 mg by mouth daily.  01/29/17  Yes [provider]  citalopram (CELEXA) 40 MG tablet Take 40 mg by mouth daily. 06/19/21  Yes [provider]  fluticasone (FLONASE) 50 MCG/ACT nasal spray Place 2 sprays into both nostrils daily.    Yes [provider]  levothyroxine (SYNTHROID, LEVOTHROID) 88 MCG tablet Take 88 mcg by mouth daily before breakfast.  02/05/17  Yes [provider]  metFORMIN (GLUCOPHAGE) 500 MG tablet Take 500 mg by mouth daily. 08/10/21  Yes [provider]  Probiotic Product  (ALOE 28366 & PROBIOTICS PO) Take 1 capsule by mouth daily.    Yes [provider]  SYMBICORT 160-4.5 MCG/ACT inhaler Inhale into the lungs. 08/03/21  Yes [provider]  traZODone (DESYREL) 50 MG tablet Take 1-2 tablets by mouth at bedtime.   Yes [provider]  triamcinolone ointment (KENALOG) 0.5 % Apply 1 Application topically 2 (two) times daily. 02/06/22  Yes Danton Clap, PA-C  vitamin B-12 (CYANOCOBALAMIN) 1000 MCG tablet Take by mouth. 03/29/21  Yes [provider]  acetaminophen (TYLENOL) 325 MG tablet Take 2 tablets (650 mg total) by mouth every 6 (six) hours as needed for mild pain (or Fever >/= 101). 06/05/17   Loletha Grayer, MD  albuterol (PROVENTIL) (2.5 MG/3ML) 0.083% nebulizer solution Take 3 mLs (2.5 mg total) by nebulization every 2 (two) hours as needed for wheezing. 06/05/17   Loletha Grayer, MD  Catheters (INTERMITTENT 14FR/40CM) MISC Use for urinary self-catheterization 3 times a day as directed 09/21/14   [provider]  cetirizine (ZYRTEC) 10 MG tablet Take 10 mg by mouth daily.     [provider]  famotidine (PEPCID) 40 MG tablet Take 40 mg by mouth daily. 08/17/21   [provider]  ondansetron (ZOFRAN) 4 MG tablet Take 1 tablet (4 mg total) by mouth every 6 (six) hours  as needed for nausea. 06/05/17   Loletha Grayer, MD  rosuvastatin (CRESTOR) 5 MG tablet Take 5 mg by mouth daily. 08/10/21   [provider]  sertraline (ZOLOFT) 100 MG tablet Take 100 mg by mouth daily.     [provider]  sodium chloride 0.9 % infusion Inject 75 mLs into the vein continuous. 06/05/17   Loletha Grayer, MD    Family History Family History  Problem Relation Age of Onset   Bladder Cancer Neg Hx    Kidney cancer Neg Hx     Social History Social History   Tobacco Use   Smoking status: Never   Smokeless tobacco: Never  Vaping Use   Vaping Use: Never used  Substance Use Topics   Alcohol use: Yes    Drug use: No     Allergies   Penicillins, Melatonin, and Sulfa antibiotics   Review of Systems Review of Systems  Constitutional:  Negative for fatigue and fever.  Musculoskeletal:  Positive for joint swelling. Negative for arthralgias and gait problem.  Skin:  Positive for color change and rash.  Neurological:  Negative for weakness and numbness.     Physical Exam Triage Vital Signs ED Triage Vitals  Enc Vitals Group     BP 02/06/22 1437 131/83     Pulse Rate 02/06/22 1437 94     Resp 02/06/22 1437 16     Temp 02/06/22 1437 98.4 F (36.9 C)     Temp Source 02/06/22 1437 Oral     SpO2 02/06/22 1437 97 %     Weight 02/06/22 1434 175 lb 0.7 oz (79.4 kg)     Height 02/06/22 1434 '5\' 2"'$  (1.575 m)     Head Circumference --      Peak Flow --      Pain Score 02/06/22 1433 3     Pain Loc --      Pain Edu? --      Excl. in [place]? --    No data found.  Updated Vital Signs BP 131/83 (BP Location: Left Arm)   Pulse 94   Temp 98.4 F (36.9 C) (Oral)   Resp 16   Ht '5\' 2"'$  (1.575 m)   Wt 175 lb 0.7 oz (79.4 kg)   SpO2 97%   BMI 32.02 kg/m      Physical Exam Vitals and nursing note reviewed.  Constitutional:      General: She is not in acute distress.    Appearance: Normal appearance. She is not ill-appearing or toxic-appearing.  HENT:     Head: Normocephalic and atraumatic.  Eyes:     General: No scleral icterus.       Right eye: No discharge.        Left eye: No discharge.     Conjunctiva/sclera: Conjunctivae normal.  Cardiovascular:     Rate and Rhythm: Normal rate and regular rhythm.  Pulmonary:     Effort: Pulmonary effort is normal. No respiratory distress.  Musculoskeletal:     Cervical back: Neck supple.  Skin:    General: Skin is dry.     Findings: Rash present.     Comments: There are 2 large areas of erythema, induration with central tiny puncture consistent with insect bites of the left anterior thigh.  Of the left ankle there is increased swelling,  erythema and abrasions.  All areas are nontender.  Clearish serous drainage from the ankle  Neurological:     General: No focal deficit present.  Mental Status: She is alert. Mental status is at baseline.     Motor: No weakness.     Gait: Gait normal.  Psychiatric:        Mood and Affect: Mood normal.        Behavior: Behavior normal.        Thought Content: Thought content normal.      UC Treatments / Results  Labs (all labs ordered are listed, but only abnormal results are displayed) Labs Reviewed - No data to display  EKG   Radiology No results found.  Procedures Procedures (including critical care time)  Medications Ordered in UC Medications - No data to display  Initial Impression / Assessment and Plan / UC Course  I have reviewed the triage vital signs and the nursing notes.  Pertinent labs & imaging results that were available during my care of the patient were reviewed by me and considered in my medical decision making (see chart for details).   51 year old female presents for rash after insect bite/sting that occurred yesterday while at work.  On exam shows 2 large areas of erythema and induration of the left thigh and one area of the left ankle.  Rash does appear to be consistent with insect bite.  She has no tenderness, fever and I have low suspicion for infection at this time.  Suspect localized allergic reaction to what ever bit or stung her.  Advised application of triamcinolone ointment and antihistamines as well as elevation and ice.  Reviewed returning for any acute worsening of symptoms or signs of infection.   Final Clinical Impressions(s) / UC Diagnoses   Final diagnoses:  Rash  Insect bite of left thigh, initial encounter     Discharge Instructions      -Rash is consistent with insect bites.  Apply ice to the area to help with swelling.  Also elevate extremity. - I have sent a topical corticosteroid ointment to apply to the rash as  well. -Consider antihistamines.  Try nondrowsy Claritin during the day, Benadryl at night. - Keep the area clean with soap and water and monitor for signs of infection including increased pain, swelling, fever, pustular drainage.  If that occurs, please return for reevaluation.    ED Prescriptions     Medication Sig Dispense Auth. Provider   triamcinolone ointment (KENALOG) 0.5 % Apply 1 Application topically 2 (two) times daily. 30 g Gretta Cool      PDMP not reviewed this encounter.   Danton Clap, PA-C 02/06/22 1534

## 2022-05-25 ENCOUNTER — Encounter: Payer: Self-pay | Admitting: Emergency Medicine

## 2022-05-25 ENCOUNTER — Ambulatory Visit
Admission: EM | Admit: 2022-05-25 | Discharge: 2022-05-25 | Disposition: A | Discharge status: Home / Self Care | Payer: BC Managed Care – PPO | Attending: Family Medicine | Admitting: Family Medicine

## 2022-05-25 DIAGNOSIS — N390 Urinary tract infection, site not specified: Secondary | ICD-10-CM | POA: Diagnosis not present

## 2022-05-25 LAB — URINALYSIS, ROUTINE W REFLEX MICROSCOPIC
Bilirubin Urine: NEGATIVE
Glucose, UA: NEGATIVE mg/dL
Ketones, ur: NEGATIVE mg/dL
Nitrite: NEGATIVE
Protein, ur: 30 mg/dL — AB
Specific Gravity, Urine: <1.005 — ABNORMAL LOW (ref 1.005–1.030)
pH: 5.5 (ref 5.0–8.0)

## 2022-05-25 LAB — URINALYSIS, MICROSCOPIC (REFLEX): Squamous Epithelial / HPF: NONE SEEN /HPF (ref 0–5)

## 2022-05-25 MED ORDER — CIPROFLOXACIN HCL 500 MG PO TABS: 500.0000 mg | ORAL_TABLET | Freq: Two times a day (BID) | ORAL | 0 refills | Status: DC

## 2022-05-25 NOTE — ED Triage Notes (Signed)
Patient c/o headache, nausea, burning and pain in her back that started yesterday.

## 2022-05-25 NOTE — ED Provider Notes (Signed)
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  Patient states that symptoms started yesterday.  She is concerned that she has a UTI.  This is how she has presented previously with UTIs after she had her cystectomy and ileal conduit.  She reports back/left flank pain, abdominal pain, headache, nausea, dizziness.  No fever.  She does note chills.  Afebrile here currently.  No relieving factors.  No other associated symptoms.  No other complaints.   Past Medical History:  Diagnosis Date   Anxiety    Cervical cancer ([place])    Depression    Diabetes mellitus without complication ([place])    DVT (deep venous thrombosis) ([place])    Hormone disorder    Hypertension    Hypothyroidism     Patient Active Problem List   Diagnosis Date Noted   Ascites 06/01/2017    Past Surgical History:  Procedure Laterality Date   ABDOMINAL HYSTERECTOMY      OB History   No obstetric history on file.      Home Medications    Prior to Admission medications   Medication Sig Start Date End Date Taking? Authorizing Provider  ARIPiprazole (ABILIFY) 5 MG tablet Take by mouth. 05/03/22  Yes [provider]  ciprofloxacin (CIPRO) 500 MG tablet Take 1 tablet (500 mg total) by mouth every 12 (twelve) hours. 05/25/22  Yes Raizy Auzenne G, DO  citalopram (CELEXA) 40 MG tablet Take 40 mg by mouth daily. 06/19/21  Yes [provider]  famotidine (PEPCID) 40 MG tablet Take 40 mg by mouth daily. 08/17/21  Yes [provider]  fluticasone (FLONASE) 50 MCG/ACT nasal spray Place 2 sprays into both nostrils daily.    Yes [provider]  levothyroxine (SYNTHROID, LEVOTHROID) 88 MCG tablet Take 88 mcg by mouth daily before breakfast.  02/05/17  Yes [provider]   metFORMIN (GLUCOPHAGE) 500 MG tablet Take 500 mg by mouth daily. 08/10/21  Yes [provider]  rosuvastatin (CRESTOR) 5 MG tablet Take 5 mg by mouth daily. 08/10/21  Yes [provider]  SYMBICORT 160-4.5 MCG/ACT inhaler Inhale into the lungs. 08/03/21  Yes [provider]  traZODone (DESYREL) 50 MG tablet Take 1-2 tablets by mouth at bedtime.   Yes [provider]  vitamin B-12 (CYANOCOBALAMIN) 1000 MCG tablet Take by mouth. 03/29/21  Yes [provider]  acetaminophen (TYLENOL) 325 MG tablet Take 2 tablets (650 mg total) by mouth every 6 (six) hours as needed for mild pain (or Fever >/= 101). 06/05/17   Loletha Grayer, MD  albuterol (PROVENTIL) (2.5 MG/3ML) 0.083% nebulizer solution Take 3 mLs (2.5 mg total) by nebulization every 2 (two) hours as needed for wheezing. 06/05/17   Loletha Grayer, MD  busPIRone (BUSPAR) 15 MG tablet Take 15 mg by mouth daily.  01/29/17   [provider]  Catheters (INTERMITTENT 14FR/40CM) MISC Use for urinary self-catheterization 3 times a day as directed 09/21/14   [provider]  cetirizine (ZYRTEC) 10 MG tablet Take 10 mg by mouth daily.     [provider]  ondansetron (ZOFRAN) 4 MG tablet Take 1 tablet (4 mg total) by mouth every 6 (six) hours as needed for nausea. 06/05/17   Loletha Grayer, MD  Probiotic  Product (ALOE 25427 & PROBIOTICS PO) Take 1 capsule by mouth daily.     [provider]  sertraline (ZOLOFT) 100 MG tablet Take 100 mg by mouth daily.     [provider]  sodium chloride 0.9 % infusion Inject 75 mLs into the vein continuous. 06/05/17   Loletha Grayer, MD  triamcinolone ointment (KENALOG) 0.5 % Apply 1 Application topically 2 (two) times daily. 02/06/22   Danton Clap, PA-C    Family History Family History  Problem Relation Age of Onset   Bladder Cancer Neg Hx    Kidney cancer Neg Hx     Social History Social History   Tobacco Use   Smoking  status: Never   Smokeless tobacco: Never  Vaping Use   Vaping Use: Never used  Substance Use Topics   Alcohol use: Yes   Drug use: No     Allergies   Penicillins, Melatonin, and Sulfa antibiotics   Review of Systems Review of Systems Per HPI  Physical Exam Triage Vital Signs ED Triage Vitals  Enc Vitals Group     BP 05/25/22 1545 128/81     Pulse Rate 05/25/22 1545 100     Resp 05/25/22 1545 14     Temp 05/25/22 1545 98.9 F (37.2 C)     Temp Source 05/25/22 1545 Oral     SpO2 05/25/22 1545 98 %     Weight 05/25/22 1542 185 lb (83.9 kg)     Height 05/25/22 1542 '5\' 2"'$  (1.575 m)     Head Circumference --      Peak Flow --      Pain Score 05/25/22 1542 6     Pain Loc --      Pain Edu? --      Excl. in [place]? --    No data found.  Updated Vital Signs BP 128/81 (BP Location: Left Arm)   Pulse 100   Temp 98.9 F (37.2 C) (Oral)   Resp 14   Ht '5\' 2"'$  (1.575 m)   Wt 83.9 kg   SpO2 98%   BMI 33.84 kg/m   Visual Acuity Right Eye Distance:   Left Eye Distance:   Bilateral Distance:    Right Eye Near:   Left Eye Near:    Bilateral Near:     Physical Exam Vitals and nursing note reviewed.  Constitutional:      General: She is not in acute distress.    Appearance: She is obese.  HENT:     Head: Normocephalic and atraumatic.  Cardiovascular:     Rate and Rhythm: Regular rhythm. Tachycardia present.  Pulmonary:     Effort: Pulmonary effort is normal.     Breath sounds: Normal breath sounds. No wheezing, rhonchi or rales.  Abdominal:     Palpations: Abdomen is soft.     Comments: Mild tenderness in the left lower quadrant  Neurological:     Mental Status: She is alert.    UC Treatments / Results  Labs (all labs ordered are listed, but only abnormal results are displayed) Labs Reviewed  URINALYSIS, ROUTINE W REFLEX MICROSCOPIC - Abnormal; Notable for the following components:      Result Value   Specific Gravity, Urine <1.005 (*)    Hgb urine dipstick  MODERATE (*)    Protein, ur 30 (*)    Leukocytes,Ua MODERATE (*)    All other components within normal limits  URINALYSIS, MICROSCOPIC (REFLEX) - Abnormal; Notable for the following components:  Bacteria, UA MANY (*)    All other components within normal limits  URINE CULTURE    EKG   Radiology No results found.  Procedures Procedures (including critical care time)  Medications Ordered in UC Medications - No data to display  Initial Impression / Assessment and Plan / UC Course  I have reviewed the triage vital signs and the nursing notes.  Pertinent labs & imaging results that were available during my care of the patient were reviewed by me and considered in my medical decision making (see chart for details).    52 year old female presents for evaluation the above.  Urinalysis consistent with urinary tract infection.  Given her altered anatomy, this is complicated infection.  Concern for pyelonephritis.  Placing on Cipro.  Final Clinical Impressions(s) / UC Diagnoses   Final diagnoses:  Complicated UTI (urinary tract infection)     Discharge Instructions      If you worsen, go directly to the ER.  Take care  Dr. Lacinda Axon    ED Prescriptions     Medication Sig Dispense Auth. Provider   ciprofloxacin (CIPRO) 500 MG tablet Take 1 tablet (500 mg total) by mouth every 12 (twelve) hours. 14 tablet [name], DO      PDMP not reviewed this encounter.   [name], [place] 05/25/22 [phone]

## 2022-05-25 NOTE — Discharge Instructions (Signed)
If you worsen, go directly to the ER.  Take care  Dr. Lacinda Axon

## 2022-05-27 ENCOUNTER — Telehealth (HOSPITAL_COMMUNITY): Payer: Self-pay | Admitting: Emergency Medicine

## 2022-05-27 NOTE — Telephone Encounter (Signed)
Patient called about urine culture, wasn't sure if we were running one.  I updated her that one is in process and we are still waiting on susceptibilities.  She states she hasn't improverd much so far.  Return precautions reviewed, and explained process for reporting/changes.  She verbalized understanding

## 2022-05-28 LAB — URINE CULTURE: Culture: >=100000 — AB

## 2022-06-10 ENCOUNTER — Ambulatory Visit
Admission: RE | Admit: 2022-06-10 | Discharge: 2022-06-10 | Disposition: A | Discharge status: Home / Self Care | Payer: BC Managed Care – PPO | Source: Ambulatory Visit | Attending: Nurse Practitioner | Admitting: Nurse Practitioner

## 2022-06-10 DIAGNOSIS — Z1231 Encounter for screening mammogram for malignant neoplasm of breast: Secondary | ICD-10-CM | POA: Insufficient documentation

## 2022-06-23 IMAGING — MG MM DIGITAL SCREENING BILAT W/ TOMO AND CAD
8 series · 8 of 24 positions shown · non-contrast
Comparison: Previous exam(s).

CLINICAL DATA: Screening.

EXAM:
DIGITAL SCREENING BILATERAL MAMMOGRAM WITH TOMOSYNTHESIS AND CAD
TECHNIQUE: Bilateral screening digital craniocaudal and mediolateral oblique
mammograms were obtained. Bilateral screening digital breast
tomosynthesis was performed. The images were evaluated with
computer-aided detection.

[L CC synth-2D]
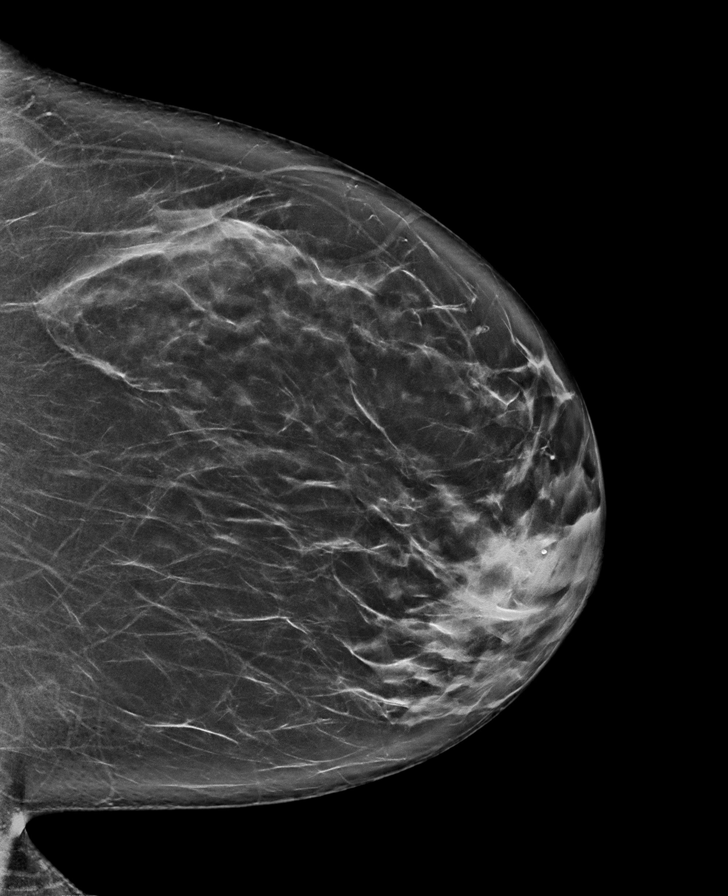

[L MLO synth-2D]
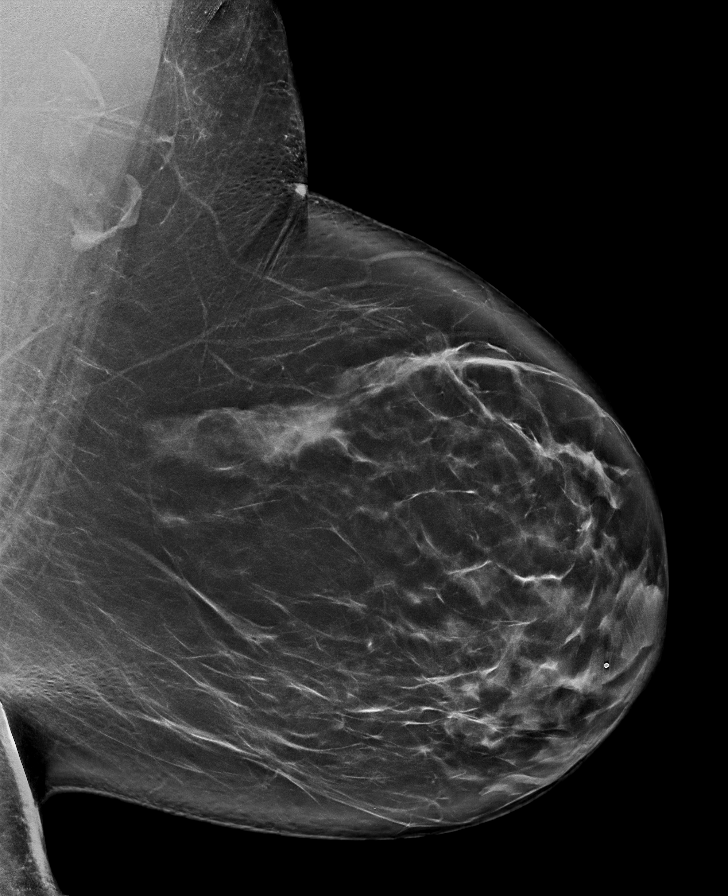

[R CC synth-2D]
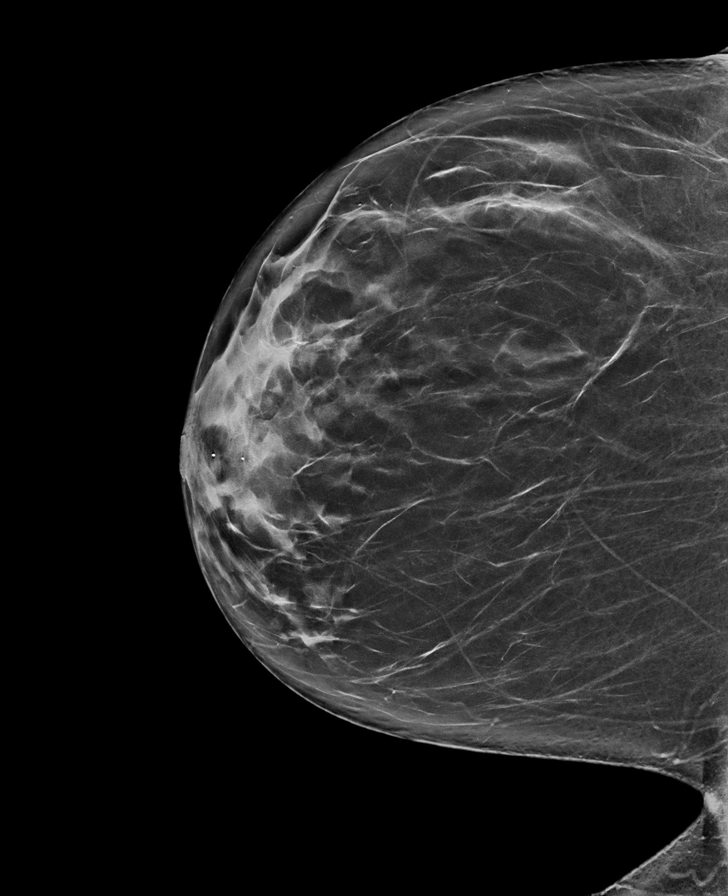

[R MLO synth-2D]
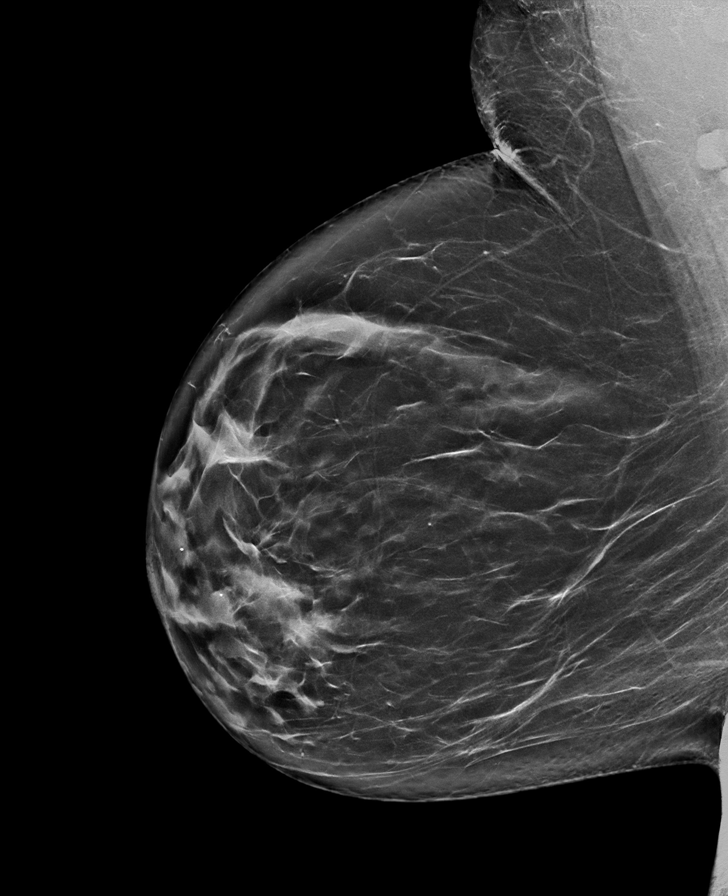

[L MLO tomo · tomo slice 51/100.0]
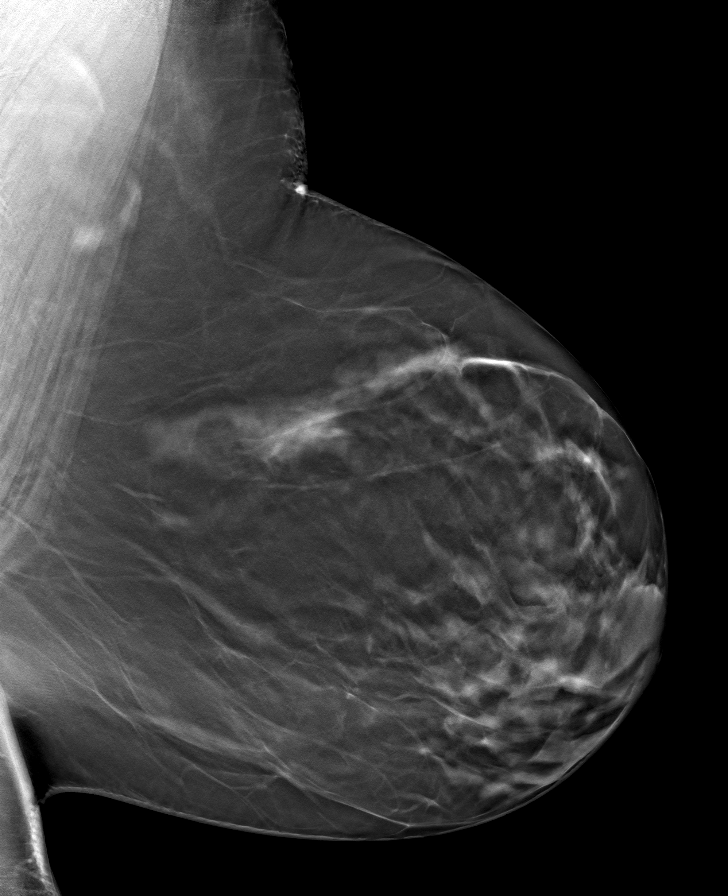

[L CC tomo · tomo slice 43/85.0]
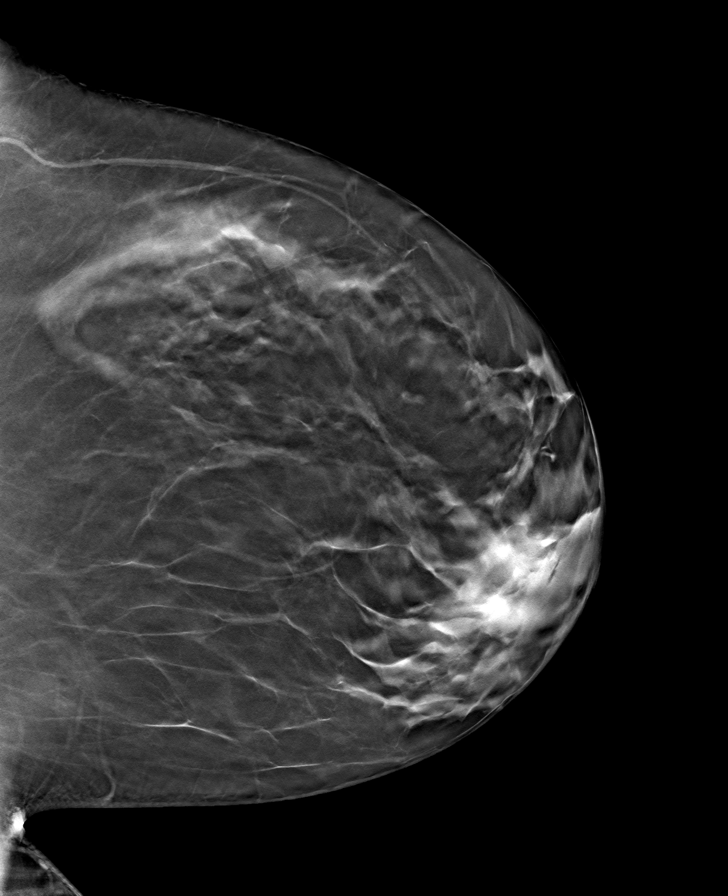

[R MLO tomo · tomo slice 43/85.0]
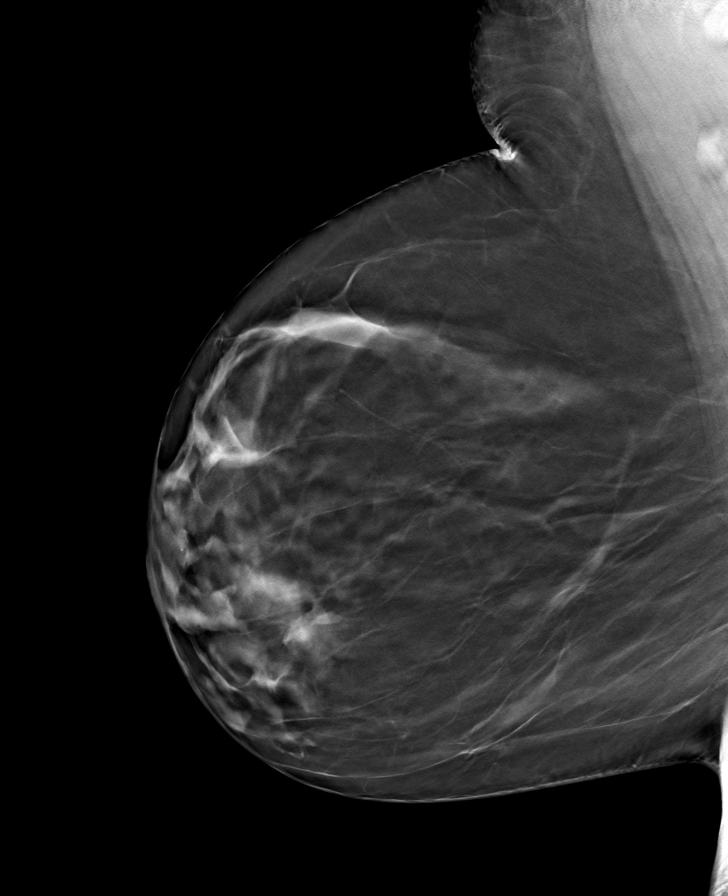

[R CC tomo · tomo slice 40/79.0]
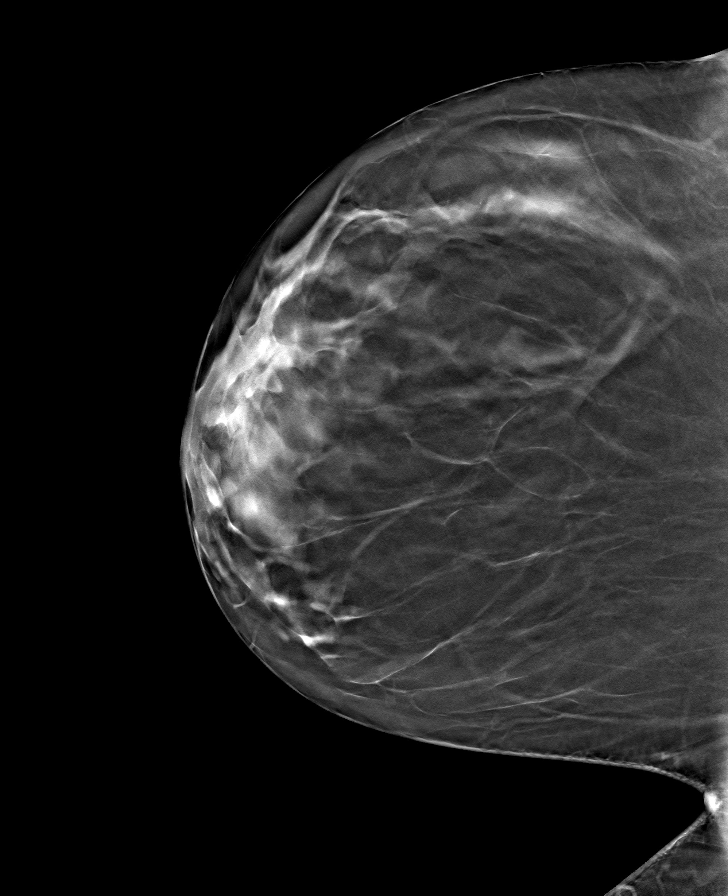

[8 of 24 positions shown; findings below may reference images not displayed]

ACR Breast Density Category b: There are scattered areas of
fibroglandular density.
FINDINGS: There are no findings suspicious for malignancy. The images were
evaluated with computer-aided detection.
IMPRESSION: No mammographic evidence of malignancy. A result letter of this
screening mammogram will be mailed directly to the patient.

RECOMMENDATION:
Screening mammogram in one year. (Code:[id number])

BI-RADS CATEGORY  1: Negative.

## 2022-10-10 DIAGNOSIS — E039 Hypothyroidism, unspecified: Secondary | ICD-10-CM | POA: Diagnosis present

## 2022-10-10 DIAGNOSIS — I10 Essential (primary) hypertension: Secondary | ICD-10-CM | POA: Diagnosis present

## 2022-10-10 DIAGNOSIS — K219 Gastro-esophageal reflux disease without esophagitis: Secondary | ICD-10-CM | POA: Diagnosis present

## 2022-11-12 ENCOUNTER — Emergency Department: Payer: BC Managed Care – PPO

## 2022-11-12 ENCOUNTER — Encounter: Payer: Self-pay | Admitting: Emergency Medicine

## 2022-11-12 ENCOUNTER — Other Ambulatory Visit: Payer: Self-pay

## 2022-11-12 ENCOUNTER — Inpatient Hospital Stay
Admission: EM | Admit: 2022-11-12 | Discharge: 2022-11-16 | DRG: 164 | Disposition: A | Discharge status: Home / Self Care | Payer: BC Managed Care – PPO | Attending: Internal Medicine | Admitting: Internal Medicine

## 2022-11-12 DIAGNOSIS — T82868A Thrombosis of vascular prosthetic devices, implants and grafts, initial encounter: Secondary | ICD-10-CM | POA: Diagnosis not present

## 2022-11-12 DIAGNOSIS — I82412 Acute embolism and thrombosis of left femoral vein: Secondary | ICD-10-CM | POA: Diagnosis present

## 2022-11-12 DIAGNOSIS — Z882 Allergy status to sulfonamides status: Secondary | ICD-10-CM

## 2022-11-12 DIAGNOSIS — E1122 Type 2 diabetes mellitus with diabetic chronic kidney disease: Secondary | ICD-10-CM | POA: Diagnosis present

## 2022-11-12 DIAGNOSIS — I82432 Acute embolism and thrombosis of left popliteal vein: Secondary | ICD-10-CM | POA: Diagnosis present

## 2022-11-12 DIAGNOSIS — Z79890 Hormone replacement therapy (postmenopausal): Secondary | ICD-10-CM | POA: Diagnosis not present

## 2022-11-12 DIAGNOSIS — I2699 Other pulmonary embolism without acute cor pulmonale: Secondary | ICD-10-CM | POA: Diagnosis not present

## 2022-11-12 DIAGNOSIS — Z8541 Personal history of malignant neoplasm of cervix uteri: Secondary | ICD-10-CM | POA: Diagnosis not present

## 2022-11-12 DIAGNOSIS — N1330 Unspecified hydronephrosis: Secondary | ICD-10-CM | POA: Diagnosis present

## 2022-11-12 DIAGNOSIS — I2609 Other pulmonary embolism with acute cor pulmonale: Principal | ICD-10-CM | POA: Diagnosis present

## 2022-11-12 DIAGNOSIS — I129 Hypertensive chronic kidney disease with stage 1 through stage 4 chronic kidney disease, or unspecified chronic kidney disease: Secondary | ICD-10-CM | POA: Diagnosis present

## 2022-11-12 DIAGNOSIS — E119 Type 2 diabetes mellitus without complications: Secondary | ICD-10-CM

## 2022-11-12 DIAGNOSIS — F419 Anxiety disorder, unspecified: Secondary | ICD-10-CM | POA: Diagnosis present

## 2022-11-12 DIAGNOSIS — E039 Hypothyroidism, unspecified: Secondary | ICD-10-CM | POA: Diagnosis present

## 2022-11-12 DIAGNOSIS — K219 Gastro-esophageal reflux disease without esophagitis: Secondary | ICD-10-CM | POA: Diagnosis present

## 2022-11-12 DIAGNOSIS — I82422 Acute embolism and thrombosis of left iliac vein: Secondary | ICD-10-CM | POA: Diagnosis not present

## 2022-11-12 DIAGNOSIS — Z79899 Other long term (current) drug therapy: Secondary | ICD-10-CM | POA: Diagnosis not present

## 2022-11-12 DIAGNOSIS — I82402 Acute embolism and thrombosis of unspecified deep veins of left lower extremity: Secondary | ICD-10-CM | POA: Diagnosis present

## 2022-11-12 DIAGNOSIS — Z880 Allergy status to penicillin: Secondary | ICD-10-CM | POA: Diagnosis not present

## 2022-11-12 DIAGNOSIS — Z7951 Long term (current) use of inhaled steroids: Secondary | ICD-10-CM

## 2022-11-12 DIAGNOSIS — F32A Depression, unspecified: Secondary | ICD-10-CM | POA: Diagnosis present

## 2022-11-12 DIAGNOSIS — I8290 Acute embolism and thrombosis of unspecified vein: Secondary | ICD-10-CM | POA: Diagnosis not present

## 2022-11-12 DIAGNOSIS — D72829 Elevated white blood cell count, unspecified: Secondary | ICD-10-CM | POA: Diagnosis present

## 2022-11-12 DIAGNOSIS — I824Y2 Acute embolism and thrombosis of unspecified deep veins of left proximal lower extremity: Secondary | ICD-10-CM | POA: Diagnosis not present

## 2022-11-12 DIAGNOSIS — E876 Hypokalemia: Secondary | ICD-10-CM | POA: Diagnosis present

## 2022-11-12 DIAGNOSIS — Z7984 Long term (current) use of oral hypoglycemic drugs: Secondary | ICD-10-CM

## 2022-11-12 DIAGNOSIS — Z888 Allergy status to other drugs, medicaments and biological substances status: Secondary | ICD-10-CM | POA: Diagnosis not present

## 2022-11-12 DIAGNOSIS — I10 Essential (primary) hypertension: Secondary | ICD-10-CM | POA: Diagnosis present

## 2022-11-12 DIAGNOSIS — N1831 Chronic kidney disease, stage 3a: Secondary | ICD-10-CM | POA: Diagnosis present

## 2022-11-12 DIAGNOSIS — Z9889 Other specified postprocedural states: Secondary | ICD-10-CM | POA: Diagnosis not present

## 2022-11-12 LAB — HEPATIC FUNCTION PANEL
ALT: 23 U/L (ref 0–44)
AST: 16 U/L (ref 15–41)
Albumin: 3.8 g/dL (ref 3.5–5.0)
Alkaline Phosphatase: 40 U/L (ref 38–126)
Bilirubin, Direct: 0.2 mg/dL (ref 0.0–0.2)
Indirect Bilirubin: 0.3 mg/dL (ref 0.3–0.9)
Total Bilirubin: 0.5 mg/dL (ref 0.3–1.2)
Total Protein: 6.7 g/dL (ref 6.5–8.1)

## 2022-11-12 LAB — BASIC METABOLIC PANEL
Anion gap: 9 (ref 5–15)
BUN: 22 mg/dL — ABNORMAL HIGH (ref 6–20)
CO2: 25 mmol/L (ref 22–32)
Calcium: 9.1 mg/dL (ref 8.9–10.3)
Chloride: 102 mmol/L (ref 98–111)
Creatinine, Ser: 1.33 mg/dL — ABNORMAL HIGH (ref 0.44–1.00)
GFR, Estimated: 48 mL/min — ABNORMAL LOW (ref >60)
Glucose, Bld: 124 mg/dL — ABNORMAL HIGH (ref 70–99)
Potassium: 3.3 mmol/L — ABNORMAL LOW (ref 3.5–5.1)
Sodium: 136 mmol/L (ref 135–145)

## 2022-11-12 LAB — CBC WITH DIFFERENTIAL/PLATELET
Abs Immature Granulocytes: 0.05 10*3/uL (ref 0.00–0.07)
Basophils Absolute: 0.1 10*3/uL (ref 0.0–0.1)
Basophils Relative: 1 %
Eosinophils Absolute: 0.4 10*3/uL (ref 0.0–0.5)
Eosinophils Relative: 3 %
HCT: 38.5 % (ref 36.0–46.0)
Hemoglobin: 12.3 g/dL (ref 12.0–15.0)
Immature Granulocytes: 0 %
Lymphocytes Relative: 17 %
Lymphs Abs: 2.3 10*3/uL (ref 0.7–4.0)
MCH: 28.3 pg (ref 26.0–34.0)
MCHC: 31.9 g/dL (ref 30.0–36.0)
MCV: 88.7 fL (ref 80.0–100.0)
Monocytes Absolute: 0.9 10*3/uL (ref 0.1–1.0)
Monocytes Relative: 7 %
Neutro Abs: 9.3 10*3/uL — ABNORMAL HIGH (ref 1.7–7.7)
Neutrophils Relative %: 72 %
Platelets: 194 10*3/uL (ref 150–400)
RBC: 4.34 MIL/uL (ref 3.87–5.11)
RDW: 12.3 % (ref 11.5–15.5)
WBC: 13 10*3/uL — ABNORMAL HIGH (ref 4.0–10.5)
nRBC: 0 % (ref 0.0–0.2)

## 2022-11-12 LAB — GLUCOSE, CAPILLARY
Glucose-Capillary: 111 mg/dL — ABNORMAL HIGH (ref 70–99)
Glucose-Capillary: 100 mg/dL — ABNORMAL HIGH (ref 70–99)

## 2022-11-12 LAB — MRSA NEXT GEN BY PCR, NASAL: MRSA by PCR Next Gen: NOT DETECTED

## 2022-11-12 LAB — TROPONIN I (HIGH SENSITIVITY)
Troponin I (High Sensitivity): 5 ng/L (ref <18)
Troponin I (High Sensitivity): 4 ng/L (ref <18)

## 2022-11-12 LAB — APTT: aPTT: 27 seconds (ref 24–36)

## 2022-11-12 LAB — BRAIN NATRIURETIC PEPTIDE: B Natriuretic Peptide: 18.2 pg/mL (ref 0.0–100.0)

## 2022-11-12 LAB — PROTIME-INR
INR: 1.2 (ref 0.8–1.2)
Prothrombin Time: 14.9 seconds (ref 11.4–15.2)

## 2022-11-12 MED ORDER — SERTRALINE HCL 50 MG PO TABS: 100.0000 mg | ORAL_TABLET | Freq: Every day | ORAL | Status: DC

## 2022-11-12 MED ORDER — LEVOTHYROXINE SODIUM 100 MCG PO TABS
100.0000 ug | ORAL_TABLET | Freq: Every day | ORAL | Status: DC
  Administered 2022-11-13 – 2022-11-16 (×4): 100 ug via ORAL
  Filled 2022-11-12 (×4): qty 1

## 2022-11-12 MED ORDER — LACTATED RINGERS IV SOLN: INTRAVENOUS | Status: AC

## 2022-11-12 MED ORDER — FLUTICASONE FUROATE-VILANTEROL 200-25 MCG/ACT IN AEPB
1.0000 | INHALATION_SPRAY | Freq: Every day | RESPIRATORY_TRACT | Status: DC
  Filled 2022-11-12 (×3): qty 28

## 2022-11-12 MED ORDER — LEVOTHYROXINE SODIUM 88 MCG PO TABS: 88.0000 ug | ORAL_TABLET | Freq: Every day | ORAL | Status: DC

## 2022-11-12 MED ORDER — HEPARIN BOLUS VIA INFUSION
5000.0000 [IU] | Freq: Once | INTRAVENOUS | Status: AC
  Administered 2022-11-12: 5000 [IU] via INTRAVENOUS
  Filled 2022-11-12: qty 5000

## 2022-11-12 MED ORDER — CITALOPRAM HYDROBROMIDE 20 MG PO TABS
40.0000 mg | ORAL_TABLET | Freq: Every day | ORAL | Status: DC
  Administered 2022-11-13 – 2022-11-16 (×4): 40 mg via ORAL
  Filled 2022-11-12 (×4): qty 2

## 2022-11-12 MED ORDER — HEPARIN (PORCINE) 25000 UT/250ML-% IV SOLN
1200.0000 [IU]/h | INTRAVENOUS | Status: DC
  Administered 2022-11-12: 1200 [IU]/h via INTRAVENOUS
  Filled 2022-11-12: qty 250

## 2022-11-12 MED ORDER — TRAZODONE HCL 100 MG PO TABS
100.0000 mg | ORAL_TABLET | Freq: Every day | ORAL | Status: DC
  Administered 2022-11-12 – 2022-11-15 (×4): 100 mg via ORAL
  Filled 2022-11-12 (×4): qty 1

## 2022-11-12 MED ORDER — ORAL CARE MOUTH RINSE: 15.0000 mL | OROMUCOSAL | Status: DC | PRN

## 2022-11-12 MED ORDER — ACETAMINOPHEN 325 MG PO TABS: 650.0000 mg | ORAL_TABLET | Freq: Four times a day (QID) | ORAL | Status: DC | PRN

## 2022-11-12 MED ORDER — ROSUVASTATIN CALCIUM 5 MG PO TABS
5.0000 mg | ORAL_TABLET | Freq: Every day | ORAL | Status: DC
  Administered 2022-11-13 – 2022-11-16 (×4): 5 mg via ORAL
  Filled 2022-11-12 (×4): qty 1

## 2022-11-12 MED ORDER — PANTOPRAZOLE SODIUM 40 MG IV SOLR
40.0000 mg | Freq: Two times a day (BID) | INTRAVENOUS | Status: DC
  Administered 2022-11-12: 40 mg via INTRAVENOUS
  Filled 2022-11-12: qty 10

## 2022-11-12 MED ORDER — BUSPIRONE HCL 10 MG PO TABS
10.0000 mg | ORAL_TABLET | Freq: Two times a day (BID) | ORAL | Status: DC
  Administered 2022-11-12 – 2022-11-16 (×8): 10 mg via ORAL
  Filled 2022-11-12 (×8): qty 1

## 2022-11-12 MED ORDER — LEVOTHYROXINE SODIUM 100 MCG PO TABS: 100.0000 ug | ORAL_TABLET | Freq: Every day | ORAL | Status: DC

## 2022-11-12 MED ORDER — IOHEXOL 350 MG/ML SOLN
75.0000 mL | Freq: Once | INTRAVENOUS | Status: AC | PRN
  Administered 2022-11-12: 75 mL via INTRAVENOUS

## 2022-11-12 MED ORDER — SODIUM CHLORIDE 0.9% FLUSH
3.0000 mL | Freq: Two times a day (BID) | INTRAVENOUS | Status: DC
  Administered 2022-11-12 – 2022-11-16 (×8): 3 mL via INTRAVENOUS

## 2022-11-12 MED ORDER — HYDROCODONE-ACETAMINOPHEN 5-325 MG PO TABS: 1.0000 | ORAL_TABLET | ORAL | Status: DC | PRN

## 2022-11-12 MED ORDER — ACETAMINOPHEN 650 MG RE SUPP: 650.0000 mg | Freq: Four times a day (QID) | RECTAL | Status: DC | PRN

## 2022-11-12 MED ORDER — MORPHINE SULFATE (PF) 2 MG/ML IV SOLN: 2.0000 mg | INTRAVENOUS | Status: DC | PRN

## 2022-11-12 MED ORDER — INSULIN ASPART 100 UNIT/ML IJ SOLN: 0.0000 [IU] | INTRAMUSCULAR | Status: DC

## 2022-11-12 MED ORDER — CHLORHEXIDINE GLUCONATE CLOTH 2 % EX PADS
6.0000 | MEDICATED_PAD | Freq: Every day | CUTANEOUS | Status: DC
  Administered 2022-11-12 – 2022-11-14 (×3): 6 via TOPICAL
  Filled 2022-11-12: qty 6

## 2022-11-12 NOTE — Assessment & Plan Note (Addendum)
Sliding scale Novolog Hold metformin.  Resume home regimen at d/c.

## 2022-11-12 NOTE — ED Notes (Signed)
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

## 2022-11-12 NOTE — H&P (Addendum)
History and Physical    Patient: Cynthia Mccarthy:[id number] DOB: June 25, 1970 DOA: 11/12/2022 DOS: the patient was seen and examined on 11/12/2022 PCP: Myrene Buddy, NP  Patient coming from: Home  Chief Complaint:  Chief Complaint  Patient presents with   Leg Pain    HPI: Cynthia Mccarthy is a 52 y.o. female with medical history significant for leg pain and SOB. Pt states she thought it was her being lazy , but left leg pain is getting worse with swelling. Pt had tongue surgery 2 weeks ago.  In ed pt is alert awake and gives history pt has h/o DM II/ and DVT that was provoked.  In Ed pt is afebrile. Labs shows hypokalemia of 3.3 and CKD stable at 1.3 improved from baseline of 3's.Normal LFT, Leucocytosis of 13.  Review of Systems: Review of Systems  Respiratory:  Positive for shortness of breath.   Musculoskeletal:        Leg pain.   All other systems reviewed and are negative.  Past Medical History:  Diagnosis Date   Anxiety    Cervical cancer (HCC)    Depression    Diabetes mellitus without complication (HCC)    DVT (deep venous thrombosis) (HCC)    Hormone disorder    Hypertension    Hypothyroidism    Past Surgical History:  Procedure Laterality Date   ABDOMINAL HYSTERECTOMY     Social History:  reports that she has never smoked. She has never used smokeless tobacco. She reports current alcohol use. She reports that she does not use drugs.  Allergies  Allergen Reactions   Penicillins Rash    Has patient had a PCN reaction causing immediate rash, facial/tongue/throat swelling, SOB or lightheadedness with hypotension: Yes Has patient had a PCN reaction causing severe rash involving mucus membranes or skin necrosis: No Has patient had a PCN reaction that required hospitalization: No Has patient had a PCN reaction occurring within the last 10 years: Yes If all of the above answers are "NO", then may proceed with Cephalosporin use.   Melatonin     Other  reaction(s): Other (See Comments), Other (See Comments) Pt. States have nightmares Pt. States have nightmares    Sulfa Antibiotics Hives and Rash    Family History  Problem Relation Age of Onset   Bladder Cancer Neg Hx    Kidney cancer Neg Hx     Prior to Admission medications   Medication Sig Start Date End Date Taking? Authorizing Provider  acetaminophen (TYLENOL) 325 MG tablet Take 2 tablets (650 mg total) by mouth every 6 (six) hours as needed for mild pain (or Fever >/= 101). 06/05/17   Alford Highland, MD  albuterol (PROVENTIL) (2.5 MG/3ML) 0.083% nebulizer solution Take 3 mLs (2.5 mg total) by nebulization every 2 (two) hours as needed for wheezing. 06/05/17   Alford Highland, MD  ARIPiprazole (ABILIFY) 5 MG tablet Take by mouth. 05/03/22   [provider]  busPIRone (BUSPAR) 15 MG tablet Take 15 mg by mouth daily.  01/29/17   [provider]  Catheters (INTERMITTENT 14FR/40CM) MISC Use for urinary self-catheterization 3 times a day as directed 09/21/14   [provider]  cetirizine (ZYRTEC) 10 MG tablet Take 10 mg by mouth daily.     [provider]  ciprofloxacin (CIPRO) 500 MG tablet Take 1 tablet (500 mg total) by mouth every 12 (twelve) hours. 05/25/22   Tommie Sams, DO  citalopram (CELEXA) 40 MG tablet Take 40 mg by  mouth daily. 06/19/21   [provider]  famotidine (PEPCID) 40 MG tablet Take 40 mg by mouth daily. 08/17/21   [provider]  fluticasone (FLONASE) 50 MCG/ACT nasal spray Place 2 sprays into both nostrils daily.     [provider]  levothyroxine (SYNTHROID, LEVOTHROID) 88 MCG tablet Take 88 mcg by mouth daily before breakfast.  02/05/17   [provider]  metFORMIN (GLUCOPHAGE) 500 MG tablet Take 500 mg by mouth daily. 08/10/21   [provider]  ondansetron (ZOFRAN) 4 MG tablet Take 1 tablet (4 mg total) by mouth every 6 (six) hours as needed for nausea. 06/05/17   Alford Highland, MD   Probiotic Product (ALOE 24401 & PROBIOTICS PO) Take 1 capsule by mouth daily.     [provider]  rosuvastatin (CRESTOR) 5 MG tablet Take 5 mg by mouth daily. 08/10/21   [provider]  sertraline (ZOLOFT) 100 MG tablet Take 100 mg by mouth daily.     [provider]  sodium chloride 0.9 % infusion Inject 75 mLs into the vein continuous. 06/05/17   Alford Highland, MD  SYMBICORT 160-4.5 MCG/ACT inhaler Inhale into the lungs. 08/03/21   [provider]  traZODone (DESYREL) 50 MG tablet Take 1-2 tablets by mouth at bedtime.    [provider]  triamcinolone ointment (KENALOG) 0.5 % Apply 1 Application topically 2 (two) times daily. 02/06/22   Eusebio Friendly B, PA-C  vitamin B-12 (CYANOCOBALAMIN) 1000 MCG tablet Take by mouth. 03/29/21   [provider]   Vitals:   11/12/22 2013 11/12/22 2100 11/12/22 2200 11/12/22 2300  BP:   131/76   Pulse: 73 64 68 68  Resp: 20 14 19 19   Temp:      TempSrc:      SpO2: 100% 100% 99% 100%  Weight:      Height:       Physical Exam Vitals and nursing note reviewed.  Constitutional:      General: She is not in acute distress. HENT:     Head: Normocephalic and atraumatic.     Right Ear: Hearing normal.     Left Ear: Hearing normal.     Nose: Nose normal. No nasal deformity.     Mouth/Throat:     Lips: Pink.     Tongue: No lesions.     Pharynx: Oropharynx is clear.  Eyes:     General: Lids are normal.     Extraocular Movements: Extraocular movements intact.  Cardiovascular:     Rate and Rhythm: Normal rate and regular rhythm.     Heart sounds: Normal heart sounds.  Pulmonary:     Effort: Pulmonary effort is normal.     Breath sounds: Normal breath sounds.  Abdominal:     General: Bowel sounds are normal. There is no distension.     Palpations: Abdomen is soft. There is no mass.     Tenderness: There is no abdominal tenderness.  Musculoskeletal:     Right lower leg: No edema.     Left lower  leg: No edema.  Skin:    General: Skin is warm.  Neurological:     General: No focal deficit present.     Mental Status: She is alert and oriented to person, place, and time.     Cranial Nerves: Cranial nerves 2-12 are intact.  Psychiatric:        Attention and Perception: Attention normal.        Mood and Affect:  Mood normal.        Speech: Speech normal.        Behavior: Behavior normal. Behavior is cooperative.     Labs on Admission: I have personally reviewed following labs and imaging studies  CBC: Recent Labs  Lab 11/12/22 1558  WBC 13.0*  NEUTROABS 9.3*  HGB 12.3  HCT 38.5  MCV 88.7  PLT 194   Basic Metabolic Panel: Recent Labs  Lab 11/12/22 1558  NA 136  K 3.3*  CL 102  CO2 25  GLUCOSE 124*  BUN 22*  CREATININE 1.33*  CALCIUM 9.1   GFR: Estimated Creatinine Clearance: 50 mL/min (A) (by C-G formula based on SCr of 1.33 mg/dL (H)). Liver Function Tests: Recent Labs  Lab 11/12/22 1842  AST 16  ALT 23  ALKPHOS 40  BILITOT 0.5  PROT 6.7  ALBUMIN 3.8   No results for input(s): "LIPASE", "AMYLASE" in the last 168 hours. No results for input(s): "AMMONIA" in the last 168 hours. Coagulation Profile: Recent Labs  Lab 11/12/22 1842  INR 1.2   Cardiac Enzymes: No results for input(s): "CKTOTAL", "CKMB", "CKMBINDEX", "TROPONINI" in the last 168 hours. BNP (last 3 results) No results for input(s): "PROBNP" in the last 8760 hours. HbA1C: No results for input(s): "HGBA1C" in the last 72 hours. CBG: Recent Labs  Lab 11/12/22 2000 11/12/22 2328  GLUCAP 111* 100*   Lipid Profile: No results for input(s): "CHOL", "HDL", "LDLCALC", "TRIG", "CHOLHDL", "LDLDIRECT" in the last 72 hours. Thyroid Function Tests: No results for input(s): "TSH", "T4TOTAL", "FREET4", "T3FREE", "THYROIDAB" in the last 72 hours. Anemia Panel: No results for input(s): "VITAMINB12", "FOLATE", "FERRITIN", "TIBC", "IRON", "RETICCTPCT" in the last 72 hours. Urine analysis:     Component Value Date/Time   COLORURINE YELLOW 05/25/2022 1546   APPEARANCEUR CLEAR 05/25/2022 1546   APPEARANCEUR Cloudy (A) 03/12/2017 1528   LABSPEC <1.005 (L) 05/25/2022 1546   PHURINE 5.5 05/25/2022 1546   GLUCOSEU NEGATIVE 05/25/2022 1546   HGBUR MODERATE (A) 05/25/2022 1546   BILIRUBINUR NEGATIVE 05/25/2022 1546   BILIRUBINUR negative 08/29/2021 1626   BILIRUBINUR Negative 03/12/2017 1528   KETONESUR NEGATIVE 05/25/2022 1546   PROTEINUR 30 (A) 05/25/2022 1546   UROBILINOGEN 0.2 08/29/2021 1626   NITRITE NEGATIVE 05/25/2022 1546   LEUKOCYTESUR MODERATE (A) 05/25/2022 1546    Radiological Exams on Admission: CT Angio Chest PE W/Cm &/Or Wo Cm Result Date: 11/12/2022 CLINICAL DATA:  Left leg pain and swelling, recent tongue surgery, history of DVT EXAM: CT ANGIOGRAPHY CHEST WITH CONTRAST TECHNIQUE: Multidetector CT imaging of the chest was performed using the standard protocol during bolus administration of intravenous contrast. Multiplanar CT image reconstructions and MIPs were obtained to evaluate the vascular anatomy. RADIATION DOSE REDUCTION: This exam was performed according to the departmental dose-optimization program which includes automated exposure control, adjustment of the mA and/or kV according to patient size and/or use of iterative reconstruction technique. CONTRAST:  75mL OMNIPAQUE IOHEXOL 350 MG/ML SOLN COMPARISON:  CT abdomen 06/01/2017 FINDINGS: Cardiovascular: SVC patent. Heart size normal. Borderline RV dilatation, RV/LV ratio 1.06. scattered central and  Adequate contrast opacification of the thoracic aorta with no evidence of dissection, aneurysm, or stenosis. There is classic 3-vessel brachiocephalic arch anatomy without proximal stenosis. Mediastinum/Nodes: No enlarged mediastinal, hilar, or axillary lymph nodes. Thyroid gland, trachea, and esophagus demonstrate no significant findings. Lungs/Pleura: No pleural effusion. No pneumothorax. Lungs are clear. Upper  Abdomen: Mild left hydronephrosis. Musculoskeletal: No chest wall abnormality. No acute or significant osseous findings. Review of the  MIP images confirms the above findings. IMPRESSION: 1. Positive for acute PE with CT evidence of right heart strain (RV/LV Ratio = 1.06) consistent with at least submassive (intermediate risk) PE. The presence of right heart strain has been associated with an increased risk of morbidity and mortality. Please refer to the "Code PE Focused" order set in EPIC. 2. Mild left hydronephrosis. Electronically Signed   By: Corlis Leak M.D.   On: 11/12/2022 17:41   US Venous Img Lower Unilateral Left Result Date: 11/12/2022 CLINICAL DATA:  Left leg swelling EXAM: LEFT LOWER EXTREMITY VENOUS DOPPLER ULTRASOUND TECHNIQUE: Gray-scale sonography with graded compression, as well as color Doppler and duplex ultrasound were performed to evaluate the lower extremity deep venous systems from the level of the common femoral vein and including the common femoral, femoral, profunda femoral, popliteal and calf veins including the posterior tibial, peroneal and gastrocnemius veins when visible. Spectral Doppler was utilized to evaluate flow at rest and with distal augmentation maneuvers in the common femoral, femoral and popliteal veins. COMPARISON:  None Available. FINDINGS: Contralateral Common Femoral Vein: Respiratory phasicity is normal and symmetric with the symptomatic side. No evidence of thrombus. Normal compressibility. Common Femoral Vein: Intraluminal echogenic thrombus. Vessel partially compressible. Phasic flow noted. Augmentation not performed. Saphenofemoral Junction: No evidence of thrombus. Normal compressibility and flow on color Doppler imaging. Profunda Femoral Vein: Ill-defined intraluminal thrombus. Vessel partially compressible. Phasic flow maintained. Augmentation not performed. Femoral Vein: Mixed echogenicity intraluminal thrombus. Thrombus appears occlusive. Vessel  noncompressible. Popliteal Vein: Similar mixed echogenicity intraluminal thrombus. Thrombus appearing occlusive. Vessel noncompressible. Calf Veins: Limited assessment but thrombus does appear to extend into the tibial peroneal calf veins. Vessels are noncompressible. IMPRESSION: Positive exam for extensive left femoropopliteal DVT extending into the calf veins. Electronically Signed   By: Judie Petit.  Shick M.D.   On: 11/12/2022 15:19    Data Reviewed: Relevant notes from primary care and specialist visits, past discharge summaries as available in EHR, including Care Everywhere. Prior diagnostic testing as pertinent to current admission diagnoses Updated medications and problem lists for reconciliation ED course, including vitals, labs, imaging, treatment and response to treatment Triage notes, nursing and pharmacy notes and ED provider's notes Notable results as noted in HPI.  Assessment and Plan: * Acute pulmonary embolism (HCC) CTA positive for :segmental partially occlusive PE involving all lobes with CT evidence of right heart strain (RV/LV Ratio = 1.06) consistent with at least submassive (intermediate risk) PE. Vascular surgery consulted and they are planning on " likely PE thrombectomy tomorrow with filter and possible DVT thrombectomy Friday. No additional studies currently but she should remain NPO after midnight."  Heparin gtt.  NPO after midnight . Supplemental oxygen. 2 d echo with bubble study.    Hydronephrosis, left We will obtain renal usg .   Stage 3a chronic kidney disease (HCC) Lab Results  Component Value Date   CREATININE 1.33 (H) 11/12/2022   CREATININE 3.75 (H) 06/07/2017   CREATININE 3.80 (H) 06/07/2017  Stable. Avoid contrast.    Hypothyroidism (acquired) Cont levothyroxine 100 mcg.   Gastroesophageal reflux disease IV PPI.  Essential hypertension Blood pressure 120/87, pulse 88, temperature 98 F (36.7 C), resp. rate 18, height 5\' 2"  (1.575 m), weight 88.5  kg, SpO2 96 %. Resume home meds once reconciliation is available.    Diabetes mellitus, type 2 (HCC) Glycemic protocol.  Hold metformin.  Npo after midnight.    DVT prophylaxis:  Heparin gtt.   Consults:  Vascular : Dr. Wyn Quaker.  Advance Care Planning:    Code Status: Full Code   Family Communication:  None.  Disposition Plan:  Back to previous home environment  Severity of Illness: The appropriate patient status for this patient is INPATIENT. Inpatient status is judged to be reasonable and necessary in order to provide the required intensity of service to ensure the patient's safety. The patient's presenting symptoms, physical exam findings, and initial radiographic and laboratory data in the context of their chronic comorbidities is felt to place them at high risk for further clinical deterioration. Furthermore, it is not anticipated that the patient will be medically stable for discharge from the hospital within 2 midnights of admission.   * I certify that at the point of admission it is my clinical judgment that the patient will require inpatient hospital care spanning beyond 2 midnights from the point of admission due to high intensity of service, high risk for further deterioration and high frequency of surveillance required.*  Author: Gertha Calkin, MD 11/12/2022 11:46 PM  For on call review [email or web address].

## 2022-11-12 NOTE — Assessment & Plan Note (Addendum)
On PPI

## 2022-11-12 NOTE — Assessment & Plan Note (Signed)
Cont levothyroxine 100 mcg.  

## 2022-11-12 NOTE — Assessment & Plan Note (Addendum)
We will obtain renal usg .

## 2022-11-12 NOTE — Assessment & Plan Note (Signed)
Blood pressure 120/87, pulse 88, temperature 98 F (36.7 C), resp. rate 18, height 5\' 2"  (1.575 m), weight 88.5 kg, SpO2 96 %. Resume home meds once reconciliation is available.

## 2022-11-12 NOTE — Progress Notes (Signed)
ANTICOAGULATION CONSULT NOTE - Initial Consult  Pharmacy Consult for heparin infusion Indication: pulmonary embolus  Allergies  Allergen Reactions   Penicillins Rash    Has patient had a PCN reaction causing immediate rash, facial/tongue/throat swelling, SOB or lightheadedness with hypotension: Yes Has patient had a PCN reaction causing severe rash involving mucus membranes or skin necrosis: No Has patient had a PCN reaction that required hospitalization: No Has patient had a PCN reaction occurring within the last 10 years: Yes If all of the above answers are "NO", then may proceed with Cephalosporin use.   Melatonin     Other reaction(s): Other (See Comments), Other (See Comments) Pt. States have nightmares Pt. States have nightmares    Sulfa Antibiotics Hives and Rash    Patient Measurements: Height: 5\' 2"  (157.5 cm) Weight: 88.5 kg (195 lb) IBW/kg (Calculated) : 50.1 Heparin Dosing Weight: 70.4 kg  Vital Signs: Temp: 98.5 F (36.9 C) (06/25 1357) Temp Source: Oral (06/25 1357) BP: 119/87 (06/25 1358) Pulse Rate: 90 (06/25 1358)  Labs: Recent Labs    11/12/22 1558  HGB 12.3  HCT 38.5  PLT 194  CREATININE 1.33*  TROPONINIHS 5    Estimated Creatinine Clearance: 50.6 mL/min (A) (by C-G formula based on SCr of 1.33 mg/dL (H)).   Medical History: Past Medical History:  Diagnosis Date   Anxiety    Cervical cancer (HCC)    Depression    Diabetes mellitus without complication (HCC)    DVT (deep venous thrombosis) (HCC)    Hormone disorder    Hypertension    Hypothyroidism     Assessment: 52 y.o. female with a past medical history of hypertension, diabetes, hypothyroidism, and DVT who presents to the ED complaining of leg pain. CT angio positive for acute PE with CT evidence of right heart strain. A review of medical records reveals no chronic anticoagulation prior to arrival.   Goal of Therapy:  Heparin level 0.3-0.7 units/ml Monitor platelets by  anticoagulation protocol: Yes   Plan:  Give 5000 units bolus x 1 Start heparin infusion at 1200 units/hr Check anti-Xa level in 6 hours and daily while on heparin Continue to monitor H&H and platelets  Lowella Bandy 11/12/2022,6:17 PM

## 2022-11-12 NOTE — ED Provider Notes (Signed)
[facility] Provider Note    Event Date/Time   First MD Initiated Contact with Patient 11/12/22 1508     (approximate)   History   Chief Complaint Leg Pain   HPI  Cynthia Mccarthy is a 52 y.o. female with a past medical history of hypertension, diabetes, hypothyroidism, and DVT who presents to the ED complaining of leg pain.  Patient reports that she has had approximately 1 week of discomfort and swelling around her left calf.  She states that she had partial glossectomy performed at [facility] 2 weeks ago, had been spending more time in bed prior to the onset of symptoms.  She states she is able to walk on the leg but her calf quickly gets sore when she does so.  She has not noticed any redness or skin discoloration to the leg.  She reports some shortness of breath at baseline that is slightly worse than her usual, denies any fevers, cough, or pain in her chest.  She was referred to the ED by her PCP for further evaluation for DVT.     Physical Exam   Triage Vital Signs: ED Triage Vitals  Enc Vitals Group     BP 11/12/22 1358 119/87     Pulse Rate 11/12/22 1358 90     Resp 11/12/22 1358 18     Temp 11/12/22 1357 98.5 F (36.9 C)     Temp Source 11/12/22 1357 Oral     SpO2 11/12/22 1358 92 %     Weight 11/12/22 1357 195 lb (88.5 kg)     Height 11/12/22 1357 5\' 2"  (1.575 m)     Head Circumference --      Peak Flow --      Pain Score 11/12/22 1357 2     Pain Loc --      Pain Edu? --      Excl. in GC? --     Most recent vital signs: Vitals:   11/12/22 1358 11/12/22 1830  BP: 119/87 120/87  Pulse: 90 88  Resp: 18 18  Temp:  98 F (36.7 C)  SpO2: 92% 96%    Constitutional: Alert and oriented. Eyes: Conjunctivae are normal. Head: Atraumatic. Nose: No congestion/rhinnorhea. Mouth/Throat: Mucous membranes are moist.  Cardiovascular: Normal rate, regular rhythm. Grossly normal heart sounds.  2+ DP pulses bilaterally. Respiratory: Normal respiratory  effort.  No retractions. Lungs CTAB. Gastrointestinal: Soft and nontender. No distention. Musculoskeletal: Circumferential edema to left calf with no overlying erythema or warmth, calf tenderness noted. Neurologic:  Normal speech and language. No gross focal neurologic deficits are appreciated.    ED Results / Procedures / Treatments   Labs (all labs ordered are listed, but only abnormal results are displayed) Labs Reviewed  CBC WITH DIFFERENTIAL/PLATELET - Abnormal; Notable for the following components:      Result Value   WBC 13.0 (*)    Neutro Abs 9.3 (*)    All other components within normal limits  BASIC METABOLIC PANEL - Abnormal; Notable for the following components:   Potassium 3.3 (*)    Glucose, Bld 124 (*)    BUN 22 (*)    Creatinine, Ser 1.33 (*)    GFR, Estimated 48 (*)    All other components within normal limits  PROTIME-INR  APTT  HEPARIN LEVEL (UNFRACTIONATED)  CBC  TROPONIN I (HIGH SENSITIVITY)  TROPONIN I (HIGH SENSITIVITY)    RADIOLOGY Left lower extremity ultrasound reviewed and interpreted by me with DVT extending  to the left common femoral vein.Yes, see critical care procedure note(s)  PROCEDURES:  Critical Care performed: Yes, see critical care procedure note(s)  .Critical Care  Performed by: Chesley Noon, MD Authorized by: Chesley Noon, MD   Critical care provider statement:    Critical care time (minutes):  30   Critical care time was exclusive of:  Separately billable procedures and treating other patients and teaching time   Critical care was necessary to treat or prevent imminent or life-threatening deterioration of the following conditions: Pulmonary embolism.   Critical care was time spent personally by me on the following activities:  Development of treatment plan with patient or surrogate, discussions with consultants, evaluation of patient's response to treatment, examination of patient, ordering and review of laboratory studies,  ordering and review of radiographic studies, ordering and performing treatments and interventions, pulse oximetry, re-evaluation of patient's condition and review of old charts   I assumed direction of critical care for this patient from another provider in my specialty: no     Care discussed with: admitting provider      MEDICATIONS ORDERED IN ED: Medications  heparin bolus via infusion 5,000 Units (has no administration in time range)  heparin ADULT infusion 100 units/mL (25000 units/274mL) (has no administration in time range)  iohexol (OMNIPAQUE) 350 MG/ML injection 75 mL (75 mLs Intravenous Contrast Given 11/12/22 1712)     IMPRESSION / MDM / ASSESSMENT AND PLAN / ED COURSE  I reviewed the triage vital signs and the nursing notes.                              52 y.o. female with past medical history of hypertension, diabetes, hypothyroidism, and DVT who presents to the ED complaining of left calf pain and swelling about 2 weeks after she had partial glossectomy.  Patient's presentation is most consistent with acute presentation with potential threat to life or bodily function.  Differential diagnosis includes, but is not limited to, DVT, PE, arterial insufficiency, venous insufficiency, cellulitis, muscle strain.  Patient well-appearing and in no acute distress, vital signs are unremarkable.  She has some edema and tenderness to her left lower extremity but remains neurovascularly intact.  Ultrasound is positive for fairly extensive DVT extending into her left common femoral vein.  With her increasing shortness of breath from baseline, will further assess with CTA of her chest to rule out PE.  Patient declines pain medication at this time.  She was previously anticoagulated on Eliquis, not taking this currently.  CTA of chest is positive for PE with elevated RV to LV ratio concerning for right heart strain.  Patient remains hemodynamically stable and troponin within normal limits.  We  will start her on heparin, case discussed with NP Manson Passey of vascular surgery, who reports that patient will likely undergo thrombectomy tomorrow and recommends n.p.o. after midnight.  Case discussed with hospitalist for admission.      FINAL CLINICAL IMPRESSION(S) / ED DIAGNOSES   Final diagnoses:  Acute pulmonary embolism with acute cor pulmonale, unspecified pulmonary embolism type (HCC)  Acute deep vein thrombosis (DVT) of left lower extremity, unspecified vein (HCC)     Rx / DC Orders   ED Discharge Orders     None        Note:  This document was prepared using Dragon voice recognition software and may include unintentional dictation errors.   Chesley Noon, MD 11/12/22 [phone]

## 2022-11-12 NOTE — Assessment & Plan Note (Addendum)
CTA positive for :segmental partially occlusive PE involving all lobes with CT evidence of right heart strain (RV/LV Ratio = 1.06) consistent with at least submassive (intermediate risk) PE. Echo - EF 60-65%, normal diastolics, normal RV function --Vascular surgery consulted  --Now s/p thrombectomy to all 5 lobar arteries & IVC filter placement --Transitioned IV heparin >> PO Eliquis

## 2022-11-12 NOTE — ED Triage Notes (Signed)
Patient to ED via POV for left leg pain. Swelling noted. Started Sunday but getting worse. States she had tongue surgery approx 2 weeks ago and had been "laying around" since. Hx of blood clots.

## 2022-11-12 NOTE — Assessment & Plan Note (Signed)
Renal function stable. Monitor BMP

## 2022-11-13 ENCOUNTER — Other Ambulatory Visit (HOSPITAL_COMMUNITY): Payer: Self-pay

## 2022-11-13 ENCOUNTER — Encounter
Admission: EM | Disposition: A | Discharge status: Home / Self Care | Payer: Self-pay | Source: Home / Self Care | Attending: Internal Medicine

## 2022-11-13 ENCOUNTER — Inpatient Hospital Stay: Payer: BC Managed Care – PPO

## 2022-11-13 ENCOUNTER — Other Ambulatory Visit: Payer: BC Managed Care – PPO

## 2022-11-13 DIAGNOSIS — I8290 Acute embolism and thrombosis of unspecified vein: Secondary | ICD-10-CM

## 2022-11-13 DIAGNOSIS — I2699 Other pulmonary embolism without acute cor pulmonale: Secondary | ICD-10-CM | POA: Diagnosis not present

## 2022-11-13 DIAGNOSIS — I824Y2 Acute embolism and thrombosis of unspecified deep veins of left proximal lower extremity: Secondary | ICD-10-CM

## 2022-11-13 DIAGNOSIS — I82402 Acute embolism and thrombosis of unspecified deep veins of left lower extremity: Secondary | ICD-10-CM | POA: Diagnosis present

## 2022-11-13 DIAGNOSIS — I2609 Other pulmonary embolism with acute cor pulmonale: Secondary | ICD-10-CM

## 2022-11-13 DIAGNOSIS — E876 Hypokalemia: Secondary | ICD-10-CM

## 2022-11-13 HISTORY — DX: Hypokalemia: E87.6

## 2022-11-13 HISTORY — PX: PULMONARY THROMBECTOMY: CATH118295

## 2022-11-13 HISTORY — PX: IVC FILTER INSERTION: CATH118245

## 2022-11-13 LAB — ABO/RH: ABO/RH(D): A NEG

## 2022-11-13 LAB — HEPARIN LEVEL (UNFRACTIONATED)
Heparin Unfractionated: 1.08 IU/mL — ABNORMAL HIGH (ref 0.30–0.70)
Heparin Unfractionated: 0.52 IU/mL (ref 0.30–0.70)
Heparin Unfractionated: 0.5 IU/mL (ref 0.30–0.70)

## 2022-11-13 LAB — CBC
HCT: 34.5 % — ABNORMAL LOW (ref 36.0–46.0)
Hemoglobin: 11.6 g/dL — ABNORMAL LOW (ref 12.0–15.0)
MCH: 29.3 pg (ref 26.0–34.0)
MCHC: 33.6 g/dL (ref 30.0–36.0)
MCV: 87.1 fL (ref 80.0–100.0)
Platelets: 179 10*3/uL (ref 150–400)
RBC: 3.96 MIL/uL (ref 3.87–5.11)
RDW: 12.4 % (ref 11.5–15.5)
WBC: 11.2 10*3/uL — ABNORMAL HIGH (ref 4.0–10.5)
nRBC: 0 % (ref 0.0–0.2)

## 2022-11-13 LAB — TYPE AND SCREEN
ABO/RH(D): A NEG
Antibody Screen: NEGATIVE

## 2022-11-13 LAB — COMPREHENSIVE METABOLIC PANEL
ALT: 22 U/L (ref 0–44)
AST: 14 U/L — ABNORMAL LOW (ref 15–41)
Albumin: 3.7 g/dL (ref 3.5–5.0)
Alkaline Phosphatase: 38 U/L (ref 38–126)
Anion gap: 9 (ref 5–15)
BUN: 21 mg/dL — ABNORMAL HIGH (ref 6–20)
CO2: 22 mmol/L (ref 22–32)
Calcium: 8.4 mg/dL — ABNORMAL LOW (ref 8.9–10.3)
Chloride: 106 mmol/L (ref 98–111)
Creatinine, Ser: 1.22 mg/dL — ABNORMAL HIGH (ref 0.44–1.00)
GFR, Estimated: 53 mL/min — ABNORMAL LOW (ref >60)
Glucose, Bld: 119 mg/dL — ABNORMAL HIGH (ref 70–99)
Potassium: 3.3 mmol/L — ABNORMAL LOW (ref 3.5–5.1)
Sodium: 137 mmol/L (ref 135–145)
Total Bilirubin: 0.6 mg/dL (ref 0.3–1.2)
Total Protein: 6.5 g/dL (ref 6.5–8.1)

## 2022-11-13 LAB — HIV ANTIBODY (ROUTINE TESTING W REFLEX): HIV Screen 4th Generation wRfx: NONREACTIVE

## 2022-11-13 LAB — GLUCOSE, CAPILLARY
Glucose-Capillary: 113 mg/dL — ABNORMAL HIGH (ref 70–99)
Glucose-Capillary: 117 mg/dL — ABNORMAL HIGH (ref 70–99)
Glucose-Capillary: 111 mg/dL — ABNORMAL HIGH (ref 70–99)
Glucose-Capillary: 110 mg/dL — ABNORMAL HIGH (ref 70–99)
Glucose-Capillary: 134 mg/dL — ABNORMAL HIGH (ref 70–99)
Glucose-Capillary: 110 mg/dL — ABNORMAL HIGH (ref 70–99)
Glucose-Capillary: 148 mg/dL — ABNORMAL HIGH (ref 70–99)

## 2022-11-13 SURGERY — PULMONARY THROMBECTOMY
Anesthesia: Moderate Sedation

## 2022-11-13 MED ORDER — METHYLPREDNISOLONE SODIUM SUCC 125 MG IJ SOLR: 125.0000 mg | Freq: Once | INTRAMUSCULAR | Status: DC | PRN

## 2022-11-13 MED ORDER — MIDAZOLAM HCL 2 MG/2ML IJ SOLN
INTRAMUSCULAR | Status: DC | PRN
  Administered 2022-11-13 (×2): 1 mg via INTRAVENOUS

## 2022-11-13 MED ORDER — INSULIN ASPART 100 UNIT/ML IJ SOLN
0.0000 [IU] | Freq: Every day | INTRAMUSCULAR | Status: DC
  Filled 2022-11-13: qty 1

## 2022-11-13 MED ORDER — INSULIN ASPART 100 UNIT/ML IJ SOLN
0.0000 [IU] | Freq: Three times a day (TID) | INTRAMUSCULAR | Status: DC
  Administered 2022-11-14: 1 [IU] via SUBCUTANEOUS
  Administered 2022-11-14: 2 [IU] via SUBCUTANEOUS
  Administered 2022-11-14 – 2022-11-15 (×2): 1 [IU] via SUBCUTANEOUS
  Administered 2022-11-15: 2 [IU] via SUBCUTANEOUS
  Administered 2022-11-16: 3 [IU] via SUBCUTANEOUS
  Administered 2022-11-16: 2 [IU] via SUBCUTANEOUS
  Filled 2022-11-13 (×7): qty 1

## 2022-11-13 MED ORDER — FENTANYL CITRATE PF 50 MCG/ML IJ SOSY: 12.5000 ug | PREFILLED_SYRINGE | Freq: Once | INTRAMUSCULAR | Status: DC | PRN

## 2022-11-13 MED ORDER — FAMOTIDINE 20 MG PO TABS: 40.0000 mg | ORAL_TABLET | Freq: Once | ORAL | Status: DC | PRN

## 2022-11-13 MED ORDER — FENTANYL CITRATE (PF) 100 MCG/2ML IJ SOLN
INTRAMUSCULAR | Status: AC
  Filled 2022-11-13: qty 2

## 2022-11-13 MED ORDER — PANTOPRAZOLE SODIUM 40 MG PO TBEC
40.0000 mg | DELAYED_RELEASE_TABLET | Freq: Two times a day (BID) | ORAL | Status: DC
  Administered 2022-11-13 – 2022-11-16 (×7): 40 mg via ORAL
  Filled 2022-11-13 (×7): qty 1

## 2022-11-13 MED ORDER — CEFAZOLIN SODIUM-DEXTROSE 2-4 GM/100ML-% IV SOLN
2.0000 g | INTRAVENOUS | Status: AC
  Administered 2022-11-13: 2 g via INTRAVENOUS

## 2022-11-13 MED ORDER — FENTANYL CITRATE (PF) 100 MCG/2ML IJ SOLN
INTRAMUSCULAR | Status: DC | PRN
  Administered 2022-11-13: 25 ug via INTRAVENOUS
  Administered 2022-11-13: 50 ug via INTRAVENOUS

## 2022-11-13 MED ORDER — CEFAZOLIN SODIUM-DEXTROSE 2-4 GM/100ML-% IV SOLN
INTRAVENOUS | Status: AC
  Filled 2022-11-13: qty 100

## 2022-11-13 MED ORDER — MIDAZOLAM HCL 5 MG/5ML IJ SOLN
INTRAMUSCULAR | Status: AC
  Filled 2022-11-13: qty 5

## 2022-11-13 MED ORDER — HEPARIN (PORCINE) 25000 UT/250ML-% IV SOLN
INTRAVENOUS | Status: AC
  Filled 2022-11-13: qty 250

## 2022-11-13 MED ORDER — HEPARIN (PORCINE) 25000 UT/250ML-% IV SOLN
1050.0000 [IU]/h | INTRAVENOUS | Status: DC
  Administered 2022-11-13 – 2022-11-14 (×2): 1050 [IU]/h via INTRAVENOUS
  Filled 2022-11-13: qty 250

## 2022-11-13 MED ORDER — POTASSIUM CHLORIDE CRYS ER 20 MEQ PO TBCR
40.0000 meq | EXTENDED_RELEASE_TABLET | Freq: Once | ORAL | Status: AC
  Administered 2022-11-13: 40 meq via ORAL
  Filled 2022-11-13: qty 2

## 2022-11-13 MED ORDER — HYDROMORPHONE HCL 1 MG/ML IJ SOLN: 1.0000 mg | Freq: Once | INTRAMUSCULAR | Status: DC | PRN

## 2022-11-13 MED ORDER — ONDANSETRON HCL 4 MG/2ML IJ SOLN: 4.0000 mg | Freq: Four times a day (QID) | INTRAMUSCULAR | Status: DC | PRN

## 2022-11-13 MED ORDER — IODIXANOL 320 MG/ML IV SOLN
INTRAVENOUS | Status: DC | PRN
  Administered 2022-11-13: 65 mL

## 2022-11-13 MED ORDER — LIDOCAINE-EPINEPHRINE (PF) 1 %-1:200000 IJ SOLN
INTRAMUSCULAR | Status: DC | PRN
  Administered 2022-11-13: 30 mL

## 2022-11-13 MED ORDER — HEPARIN (PORCINE) IN NACL 1000-0.9 UT/500ML-% IV SOLN
INTRAVENOUS | Status: DC | PRN
  Administered 2022-11-13: 1000 mL

## 2022-11-13 MED ORDER — HEPARIN SODIUM (PORCINE) 1000 UNIT/ML IJ SOLN
INTRAMUSCULAR | Status: DC | PRN
  Administered 2022-11-13: 4000 [IU] via INTRAVENOUS

## 2022-11-13 MED ORDER — SODIUM CHLORIDE 0.9 % IV SOLN: INTRAVENOUS | Status: DC

## 2022-11-13 MED ORDER — HEPARIN SODIUM (PORCINE) 1000 UNIT/ML IJ SOLN
INTRAMUSCULAR | Status: AC
  Filled 2022-11-13: qty 10

## 2022-11-13 MED ORDER — DIPHENHYDRAMINE HCL 50 MG/ML IJ SOLN: 50.0000 mg | Freq: Once | INTRAMUSCULAR | Status: DC | PRN

## 2022-11-13 MED ORDER — MIDAZOLAM HCL 2 MG/ML PO SYRP: 8.0000 mg | ORAL_SOLUTION | Freq: Once | ORAL | Status: DC | PRN

## 2022-11-13 SURGICAL SUPPLY — 18 items
CANISTER PENUMBRA ENGINE (MISCELLANEOUS) IMPLANT
CATH INDIGO 12XTORQ 100 (CATHETERS) IMPLANT
CATH INDIGO SEP 12 (CATHETERS) IMPLANT
CATH SELECT BERN TIP 5F 130 (CATHETERS) IMPLANT
CATH SIZING 5F 100CM .035 PIG (CATHETERS) IMPLANT
CLOSURE PERCLOSE PROSTYLE (VASCULAR PRODUCTS) IMPLANT
GLIDEWIRE ADV .035X180CM (WIRE) IMPLANT
GUIDEWIRE ADV .018X180CM (WIRE) IMPLANT
KIT FEM OPTION ELITE FILTER (Filter) IMPLANT
PACK ANGIOGRAPHY (CUSTOM PROCEDURE TRAY) ×2 IMPLANT
SHEATH BRITE TIP 6FRX11 (SHEATH) IMPLANT
SHEATH FAST CATH 12F 12CM (SHEATH) IMPLANT
SUT MNCRL AB 4-0 PS2 18 (SUTURE) IMPLANT
SUT SILK 0 FSL (SUTURE) IMPLANT
SYR MEDRAD MARK 7 150ML (SYRINGE) IMPLANT
TUBING CONTRAST HIGH PRESS 72 (TUBING) IMPLANT
WIRE GUIDERIGHT .035X150 (WIRE) IMPLANT
WIRE SUPRACORE 300CM (WIRE) IMPLANT

## 2022-11-13 NOTE — Assessment & Plan Note (Signed)
Resolved. Replaced on 6/26 for K 3.3  Monitor BMP, check Mg level

## 2022-11-13 NOTE — Progress Notes (Signed)
ANTICOAGULATION CONSULT NOTE  Pharmacy Consult for Heparin Infusion Indication: pulmonary embolus  Patient Measurements: Height: 5\' 2"  (157.5 cm) Weight: 86.8 kg (191 lb 5.8 oz) IBW/kg (Calculated) : 50.1 Heparin Dosing Weight: 70.4 kg  Labs: Recent Labs    11/12/22 1558 11/12/22 1842 11/13/22 0158 11/13/22 1008 11/13/22 1945  HGB 12.3  --  11.6*  --   --   HCT 38.5  --  34.5*  --   --   PLT 194  --  179  --   --   APTT  --  27  --   --   --   LABPROT  --  14.9  --   --   --   INR  --  1.2  --   --   --   HEPARINUNFRC  --   --  1.08* 0.52 0.50  CREATININE 1.33*  --  1.22*  --   --   TROPONINIHS 5 4  --   --   --     Estimated Creatinine Clearance: 54.6 mL/min (A) (by C-G formula based on SCr of 1.22 mg/dL (H)).  Medical History: Past Medical History:  Diagnosis Date   Anxiety    Cervical cancer (HCC)    Depression    Diabetes mellitus without complication (HCC)    DVT (deep venous thrombosis) (HCC)    Hormone disorder    Hypertension    Hypothyroidism    Assessment:  Cynthia Mccarthy is a 52 y.o. female presenting with leg pain. PMH significant for HTN, T2DM, GERD, hypothyroidism, CKD3a. Patient was not on [facility] PTA per chart review. CT angio positive for acute PE with CT evidence of right heart strain. Pharmacy has been consulted to initiate and manage heparin infusion.   Baseline Labs: aPTT 27, PT 14.9, INR 1.2, Hgb 12.3, Hct 38.5, Plt 194   Goal of Therapy:  Heparin level 0.3-0.7 units/ml Monitor platelets by anticoagulation protocol: Yes   Date Time HL Rate/Comment  6/26 0158 1.08 1200/supratherapeutic 6/26 1008 0.52 1050/therapeutic x1 6/26 1945 0.50 1050/therapeutic x2  Plan:  Continue heparin infusion at 1050 units/hr Recheck HL with AM labs Continue to monitor H&H and platelets daily while on heparin infusion   Bettey Costa, PharmD Clinical Pharmacist 11/13/2022 8:34 PM

## 2022-11-13 NOTE — H&P (View-Only) (Signed)
Hospital Consult    Reason for Consult:  Pulmonary Embolism with Deep Vein Thrombosis Requesting Physician:  Dr. Charles Jessup MD MRN #:  [id number]  History of Present Illness: This is a 52 y.o. female Cynthia Mccarthy is a 52 y.o. female with medical history significant for leg pain and SOB. She also has a past medical history of hypertension, diabetes, hypothyroidism, and DVT who presents to the ED complaining of leg pain. Patient reports that she has had approximately 1 week of discomfort and swelling around her left calf.  Pt states she thought it was her being lazy , but left leg pain is getting worse with swelling. Pt had tongue surgery 2 weeks ago.  In ed pt is alert awake and gives history pt has h/o DM II/ and DVT that was provoked.   On exam this morning patient is resting comfortably in bed in ICU. She is not requiring oxygen at this time. Saturations are 98% on room air. Normal Respiratory effort. Patient denies any pain this morning. She denies any chest pain or shortness of breath at rest. She denies any dizziness, nausea or vomiting. No complaints over night. Vitals all remain stable.   Past Medical History:  Diagnosis Date   Anxiety    Cervical cancer (HCC)    Depression    Diabetes mellitus without complication (HCC)    DVT (deep venous thrombosis) (HCC)    Hormone disorder    Hypertension    Hypothyroidism     Past Surgical History:  Procedure Laterality Date   ABDOMINAL HYSTERECTOMY      Allergies  Allergen Reactions   Penicillins Rash    Has patient had a PCN reaction causing immediate rash, facial/tongue/throat swelling, SOB or lightheadedness with hypotension: Yes Has patient had a PCN reaction causing severe rash involving mucus membranes or skin necrosis: No Has patient had a PCN reaction that required hospitalization: No Has patient had a PCN reaction occurring within the last 10 years: Yes If all of the above answers are "NO", then may proceed with  Cephalosporin use.   Melatonin     Other reaction(s): Other (See Comments), Other (See Comments) Pt. States have nightmares Pt. States have nightmares    Sulfa Antibiotics Hives and Rash    Prior to Admission medications   Medication Sig Start Date End Date Taking? Authorizing Provider  acetaminophen (TYLENOL) 325 MG tablet Take 2 tablets (650 mg total) by mouth every 6 (six) hours as needed for mild pain (or Fever >/= 101). 06/05/17  Yes Wieting, Richard, MD  albuterol (PROVENTIL) (2.5 MG/3ML) 0.083% nebulizer solution Take 3 mLs (2.5 mg total) by nebulization every 2 (two) hours as needed for wheezing. 06/05/17  Yes Wieting, Richard, MD  busPIRone (BUSPAR) 10 MG tablet Take 10 mg by mouth 2 (two) times daily.   Yes [provider]  cariprazine (VRAYLAR) 1.5 MG capsule Take 1.5 mg by mouth daily.   Yes [provider]  cetirizine (ZYRTEC) 10 MG tablet Take 10 mg by mouth daily.    Yes [provider]  chlorhexidine (PERIDEX) 0.12 % solution Use as directed 15 mLs in the mouth or throat 2 (two) times daily. Swish 15 mL by Mouth, then spit out. Do this two (2) times a day for 14 days. 10/29/22 11/14/22 Yes [provider]  citalopram (CELEXA) 40 MG tablet Take 40 mg by mouth daily. 06/19/21  Yes [provider]  famotidine (PEPCID) 40 MG tablet Take 40 mg by mouth daily. 08/17/21    Yes [provider]  fluticasone (FLONASE) 50 MCG/ACT nasal spray Place 2 sprays into both nostrils daily.    Yes [provider]  levothyroxine (SYNTHROID) 100 MCG tablet Take 100 mcg by mouth daily before breakfast. 07/04/22  Yes [provider]  metFORMIN (GLUCOPHAGE) 500 MG tablet Take 500 mg by mouth daily. 08/10/21  Yes [provider]  mupirocin ointment (BACTROBAN) 2 % Apply 1 Application topically. 1 Application Topical 2-3 Times Daily 10/10/22  Yes [provider]  ondansetron (ZOFRAN) 4 MG tablet Take 1 tablet (4 mg total) by  mouth every 6 (six) hours as needed for nausea. 06/05/17  Yes Wieting, Richard, MD  oxyCODONE (OXY IR/ROXICODONE) 5 MG immediate release tablet Take 5 mg by mouth every 6 (six) hours as needed. 10/29/22  Yes [provider]  rosuvastatin (CRESTOR) 5 MG tablet Take 5 mg by mouth daily. 08/10/21  Yes [provider]  SYMBICORT 160-4.5 MCG/ACT inhaler Inhale into the lungs. 08/03/21  Yes [provider]  traZODone (DESYREL) 100 MG tablet Take 100 mg by mouth at bedtime. Take 100 mg by mouth nightly Take 2 tabs if needed   Yes [provider]  triamcinolone ointment (KENALOG) 0.5 % Apply 1 Application topically 2 (two) times daily. 02/06/22  Yes Eaves, Lesley B, PA-C  vitamin B-12 (CYANOCOBALAMIN) 1000 MCG tablet Take by mouth. 03/29/21  Yes [provider]  Catheters (INTERMITTENT 14FR/40CM) MISC Use for urinary self-catheterization 3 times a day as directed 09/21/14   [provider]  sodium chloride 0.9 % infusion Inject 75 mLs into the vein continuous. 06/05/17   Wieting, Richard, MD    Social History   Socioeconomic History   Marital status: Married    Spouse name: Not on file   Number of children: Not on file   Years of education: Not on file   Highest education level: Not on file  Occupational History   Not on file  Tobacco Use   Smoking status: Never   Smokeless tobacco: Never  Vaping Use   Vaping Use: Never used  Substance and Sexual Activity   Alcohol use: Yes   Drug use: No   Sexual activity: Not on file  Other Topics Concern   Not on file  Social History Narrative   Not on file   Social Determinants of Health   Financial Resource Strain: Not on file  Food Insecurity: No Food Insecurity (11/12/2022)   Hunger Vital Sign    Worried About Running Out of Food in the Last Year: Never true    Ran Out of Food in the Last Year: Never true  Transportation Needs: No Transportation Needs (11/12/2022)   PRAPARE - Transportation     Lack of Transportation (Medical): No    Lack of Transportation (Non-Medical): No  Physical Activity: Not on file  Stress: Not on file  Social Connections: Not on file  Intimate Partner Violence: Not At Risk (11/12/2022)   Humiliation, Afraid, Rape, and Kick questionnaire    Fear of Current or Ex-Partner: No    Emotionally Abused: No    Physically Abused: No    Sexually Abused: No     Family History  Problem Relation Age of Onset   Bladder Cancer Neg Hx    Kidney cancer Neg Hx     ROS: Otherwise negative unless mentioned in HPI  Physical Examination  Vitals:   11/13/22 0600 11/13/22 0700  BP: 129/68   Pulse: 61 76  Resp: 17 18  Temp:      SpO2: 98% 96%   Body mass index is 35 kg/m.  General:  WDWN in NAD Gait: Not observed HENT: WNL, normocephalic Pulmonary: normal non-labored breathing, without Rales, rhonchi,  wheezing Cardiac: regular, without  Murmurs, rubs or gallops; without carotid bruits Abdomen: Positive bowel Sounds, soft, NT/ND, no masses Skin: without rashes Vascular Exam/Pulses: Palpable pulses throughout Extremities: without ischemic changes, without Gangrene , without cellulitis; without open wounds;  Musculoskeletal: no muscle wasting or atrophy  Neurologic: A&O X 3;  No focal weakness or paresthesias are detected; speech is fluent/normal Psychiatric:  The pt has Normal affect. Lymph:  Unremarkable  CBC    Component Value Date/Time   WBC 11.2 (H) 11/13/2022 0158   RBC 3.96 11/13/2022 0158   HGB 11.6 (L) 11/13/2022 0158   HCT 34.5 (L) 11/13/2022 0158   PLT 179 11/13/2022 0158   MCV 87.1 11/13/2022 0158   MCH 29.3 11/13/2022 0158   MCHC 33.6 11/13/2022 0158   RDW 12.4 11/13/2022 0158   LYMPHSABS 2.3 11/12/2022 1558   MONOABS 0.9 11/12/2022 1558   EOSABS 0.4 11/12/2022 1558   BASOSABS 0.1 11/12/2022 1558    BMET    Component Value Date/Time   NA 137 11/13/2022 0158   K 3.3 (L) 11/13/2022 0158   CL 106 11/13/2022 0158   CO2 22  11/13/2022 0158   GLUCOSE 119 (H) 11/13/2022 0158   BUN 21 (H) 11/13/2022 0158   CREATININE 1.22 (H) 11/13/2022 0158   CALCIUM 8.4 (L) 11/13/2022 0158   GFRNONAA 53 (L) 11/13/2022 0158   GFRAA 15 (L) 06/07/2017 2209    COAGS: Lab Results  Component Value Date   INR 1.2 11/12/2022   INR 1.22 06/07/2017   INR 1.22 06/07/2017     Non-Invasive Vascular Imaging:   EXAM:11/12/2022 LEFT LOWER EXTREMITY VENOUS DOPPLER ULTRASOUND   TECHNIQUE: Gray-scale sonography with graded compression, as well as color Doppler and duplex ultrasound were performed to evaluate the lower extremity deep venous systems from the level of the common femoral vein and including the common femoral, femoral, profunda femoral, popliteal and calf veins including the posterior tibial, peroneal and gastrocnemius veins when visible. Spectral Doppler was utilized to evaluate flow at rest and with distal augmentation maneuvers in the common femoral, femoral and popliteal veins.   COMPARISON:  None Available.   FINDINGS: Contralateral Common Femoral Vein: Respiratory phasicity is normal and symmetric with the symptomatic side. No evidence of thrombus. Normal compressibility.   Common Femoral Vein: Intraluminal echogenic thrombus. Vessel partially compressible. Phasic flow noted. Augmentation not performed.   Saphenofemoral Junction: No evidence of thrombus. Normal compressibility and flow on color Doppler imaging.   Profunda Femoral Vein: Ill-defined intraluminal thrombus. Vessel partially compressible. Phasic flow maintained. Augmentation not performed.   Femoral Vein: Mixed echogenicity intraluminal thrombus. Thrombus appears occlusive. Vessel noncompressible.   Popliteal Vein: Similar mixed echogenicity intraluminal thrombus. Thrombus appearing occlusive. Vessel noncompressible.   Calf Veins: Limited assessment but thrombus does appear to extend into the tibial peroneal calf veins. Vessels are  noncompressible.   IMPRESSION: Positive exam for extensive left femoropopliteal DVT extending into the calf veins.  EXAM:11/12/2022 CT ANGIOGRAPHY CHEST WITH CONTRAST   TECHNIQUE: Multidetector CT imaging of the chest was performed using the standard protocol during bolus administration of intravenous contrast. Multiplanar CT image reconstructions and MIPs were obtained to evaluate the vascular anatomy.   RADIATION DOSE REDUCTION: This exam was performed according to the departmental dose-optimization program which includes automated exposure control, adjustment of the mA and/or kV   according to patient size and/or use of iterative reconstruction technique.   CONTRAST:  75mL OMNIPAQUE IOHEXOL 350 MG/ML SOLN   COMPARISON:  CT abdomen 06/01/2017   FINDINGS: Cardiovascular: SVC patent. Heart size normal. Borderline RV dilatation, RV/LV ratio 1.06. scattered central and segmental partially occlusive PE involving all lobes. Adequate contrast opacification of the thoracic aorta with no evidence of dissection, aneurysm, or stenosis. There is classic 3-vessel brachiocephalic arch anatomy without proximal stenosis.   Mediastinum/Nodes: No enlarged mediastinal, hilar, or axillary lymph nodes. Thyroid gland, trachea, and esophagus demonstrate no significant findings.   Lungs/Pleura: No pleural effusion. No pneumothorax. Lungs are clear.   Upper Abdomen: Mild left hydronephrosis.   Musculoskeletal: No chest wall abnormality. No acute or significant osseous findings.   Review of the MIP images confirms the above findings.   IMPRESSION: 1. Positive for acute PE with CT evidence of right heart strain (RV/LV Ratio = 1.06) consistent with at least submassive (intermediate risk) PE. The presence of right heart strain has been associated with an increased risk of morbidity and mortality. Please refer to the "Code PE Focused" order set in EPIC. 2. Mild left hydronephrosis.  Statin:   Yes.   Beta Blocker:  No. Aspirin:  No. ACEI:  No. ARB:  No. CCB use:  No Other antiplatelets/anticoagulants:  No.    ASSESSMENT/PLAN: This is a 52 y.o. female Who presents to [facility] ED with 2-3 weeks of left lower extremity pain and swelling. Upon workup she was found to have a left lower extremity DVT in her popliteal vein extending up to her femoral vein. She was also note to have a pulmonary embolism as an extension of her DVT.   PLAN: Vascular Surgery is planning on taking the patient to the vascular lab today 11/13/2022 for a pulmonary thrombectomy with IVC filter placement. [facility] also planning on taking her back to the vascular lab on Friday 11/15/2022 to perform a mechanical thrombectomy of her left lower extremity DVT. I discussed in detail with the patient this morning both procedures, the benefits, risks and complication. She verbalized her understanding. I answered all her questions. She would like to proceed as soon as possible. Patient has been NPO since midnight.   -I discussed the plan in detail with Dr Jason Dew MD and he is in agreement with the plan.    Fabyan Loughmiller R Lamario [facility] 11/13/2022 7:18 AM  

## 2022-11-13 NOTE — Progress Notes (Signed)
ANTICOAGULATION CONSULT NOTE - Initial Consult  Pharmacy Consult for heparin infusion Indication: pulmonary embolus  Allergies  Allergen Reactions   Penicillins Rash    Has patient had a PCN reaction causing immediate rash, facial/tongue/throat swelling, SOB or lightheadedness with hypotension: Yes Has patient had a PCN reaction causing severe rash involving mucus membranes or skin necrosis: No Has patient had a PCN reaction that required hospitalization: No Has patient had a PCN reaction occurring within the last 10 years: Yes If all of the above answers are "NO", then may proceed with Cephalosporin use.   Melatonin     Other reaction(s): Other (See Comments), Other (See Comments) Pt. States have nightmares Pt. States have nightmares    Sulfa Antibiotics Hives and Rash    Patient Measurements: Height: 5\' 2"  (157.5 cm) Weight: 86.9 kg (191 lb 9.3 oz) IBW/kg (Calculated) : 50.1 Heparin Dosing Weight: 70.4 kg  Vital Signs: Temp: 98.1 F (36.7 C) (06/26 0200) Temp Source: Axillary (06/26 0200) BP: 109/71 (06/26 0200) Pulse Rate: 71 (06/26 0200)  Labs: Recent Labs    11/12/22 1558 11/12/22 1842 11/13/22 0158  HGB 12.3  --  11.6*  HCT 38.5  --  34.5*  PLT 194  --  179  APTT  --  27  --   LABPROT  --  14.9  --   INR  --  1.2  --   HEPARINUNFRC  --   --  1.08*  CREATININE 1.33*  --  1.22*  TROPONINIHS 5 4  --      Estimated Creatinine Clearance: 54.6 mL/min (A) (by C-G formula based on SCr of 1.22 mg/dL (H)).   Medical History: Past Medical History:  Diagnosis Date   Anxiety    Cervical cancer (HCC)    Depression    Diabetes mellitus without complication (HCC)    DVT (deep venous thrombosis) (HCC)    Hormone disorder    Hypertension    Hypothyroidism     Assessment: 52 y.o. female with a past medical history of hypertension, diabetes, hypothyroidism, and DVT who presents to the ED complaining of leg pain. CT angio positive for acute PE with CT evidence of  right heart strain. A review of medical records reveals no chronic anticoagulation prior to arrival.   Goal of Therapy:  Heparin level 0.3-0.7 units/ml Monitor platelets by anticoagulation protocol: Yes    6/26 0158 HL 1.08, supratherapeutic @ 1200 u/hr  Plan:  Heparin level is supratherapeutic Hold infusion x 1 hour, then resume at rate of 1050 units/hr Check heparin level 6 hours after drip is resumed Daily CBC while on heparin  Barrie Folk 11/13/2022,2:45 AM

## 2022-11-13 NOTE — Progress Notes (Signed)
ANTICOAGULATION CONSULT NOTE  Pharmacy Consult for Heparin Infusion Indication: pulmonary embolus  Patient Measurements: Height: 5\' 2"  (157.5 cm) Weight: 86.8 kg (191 lb 5.8 oz) IBW/kg (Calculated) : 50.1 Heparin Dosing Weight: 70.4 kg  Labs: Recent Labs    11/12/22 1558 11/12/22 1842 11/13/22 0158 11/13/22 1008  HGB 12.3  --  11.6*  --   HCT 38.5  --  34.5*  --   PLT 194  --  179  --   APTT  --  27  --   --   LABPROT  --  14.9  --   --   INR  --  1.2  --   --   HEPARINUNFRC  --   --  1.08* 0.52  CREATININE 1.33*  --  1.22*  --   TROPONINIHS 5 4  --   --     Estimated Creatinine Clearance: 54.6 mL/min (A) (by C-G formula based on SCr of 1.22 mg/dL (H)).  Medical History: Past Medical History:  Diagnosis Date   Anxiety    Cervical cancer (HCC)    Depression    Diabetes mellitus without complication (HCC)    DVT (deep venous thrombosis) (HCC)    Hormone disorder    Hypertension    Hypothyroidism     Assessment: 52 y.o. female with a past medical history of hypertension, diabetes, hypothyroidism, and DVT who presents to the ED complaining of leg pain. CT angio positive for acute PE with CT evidence of right heart strain. A review of medical records reveals no chronic anticoagulation prior to arrival.   Goal of Therapy:  Heparin level 0.3-0.7 units/ml Monitor platelets by anticoagulation protocol: Yes   6/26 0158 HL 1.08, suprathera; 1200 u/hr 6/26 1008 HL 0.52, therapeutic x 1; 1050 un/hr  Plan:  Heparin level is therapeutic x 1 Continue heparin infusion at 1050 units/hr Confirmatory heparin level in 6 hours Daily CBC while on heparin  Cynthia Mccarthy 11/13/2022,10:53 AM

## 2022-11-13 NOTE — Progress Notes (Addendum)
Progress Note   Patient: Cynthia Mccarthy:[id number] DOB: 07/20/1970 DOA: 11/12/2022     1 DOS: the patient was seen and examined on 11/13/2022   Brief hospital course: HPI on admission 11/12/22 by Dr. Renaldo Reel: "Cynthia Mccarthy is a 52 y.o. female with medical history significant for leg pain and SOB. Pt states she thought it was her being lazy , but left leg pain is getting worse with swelling. Pt had tongue surgery 2 weeks ago.  In ed pt is alert awake and gives history pt has h/o DM II/ and DVT that was provoked.  In Ed pt is afebrile. Labs shows hypokalemia of 3.3 and CKD stable at 1.3 improved from baseline of 3's.Normal LFT, Leucocytosis of 13."  Pt was found to have at least submassive acute PE on CTA chest with evidence of right heart strain, involving all lobar arteries.   Lower extremity doppler U/S also revealed extensive left femoropopliteal DVT extending into the calf veins.  Pt was admitted to the hospital and started on IV heparin.    Vascular surgery was consulted. Patient underwent mechanical thrombectomy to the RLL, RML, RUL, LUL and LLL and IVC filter placement on 11/13/22.  Assessment and Plan: * Acute pulmonary embolism (HCC) CTA positive for :segmental partially occlusive PE involving all lobes with CT evidence of right heart strain (RV/LV Ratio = 1.06) consistent with at least submassive (intermediate risk) PE. --Vascular surgery consulted  --Now s/p thrombectomy to all 5 lobar arteries & IVC filter placement --On IV heparin --Follow up pending Echo --Transition to [facility]AC when okay from vascular surgery standpoint   Hypokalemia Replacing for K 3.3 today Monitor BMP, check Mg level  Acute deep vein thrombosis (DVT) of left lower extremity (HCC) On IV heparin. Vascular surgery following. Plan for LLE thrombectomy on Friday. IVC filter placed today 11/13/22.  Hydronephrosis, left Likely chronic.  Renal U/S showed no hydronephrosis, LEFT extrarenal pelvis,  prior cystectomy.  Stage 3a chronic kidney disease (HCC) Renal function stable. Monitor BMP    Hypothyroidism (acquired) Cont levothyroxine 100 mcg.   Gastroesophageal reflux disease IV PPI.  Essential hypertension BP's currently soft. Continue holding antihypertensives & resume when indicated. Maintain MAP > 65   Diabetes mellitus, type 2 (HCC) Sliding scale Novolog Hold metformin.         Subjective: Pt seen with husband at bedside, after procedures today.  She reports feeling better.  Denies CP, SOB or other acute complaints.  Recent tongue surgery was for removal of some pre-cancerous lesions. Doing well and denies significant mouth pain.    Physical Exam: Vitals:   11/13/22 1345 11/13/22 1400 11/13/22 1415 11/13/22 1445  BP: 99/79 110/69 106/73 97/72  Pulse: 94 91 95   Resp: 15 16 19    Temp:    98.1 F (36.7 C)  TempSrc:    Oral  SpO2: 93% 96% 99%   Weight:      Height:       General exam: awake, alert, no acute distress HEENT: atraumatic, clear conjunctiva, anicteric sclera, moist mucus membranes, hearing grossly normal  Respiratory system: CTAB, no wheezes, rales or rhonchi, normal respiratory effort. On room air Cardiovascular system: normal S1/S2, RRR, no pedal edema.   Gastrointestinal system: soft, NT, ND, no HSM felt, +bowel sounds. Central nervous system: A&O x 3. no gross focal neurologic deficits, normal speech Extremities: moves all, no edema, normal tone, no calf or popliteal tenderness of left leg Skin: dry, intact, normal temperature Psychiatry: normal mood, congruent  affect, judgement and insight appear normal  Data Reviewed:  Notable labs --- K 3.3, glucose 119, BUN 21, Cr 1.22 from 1.33, ast 14, WBC 11.2 imrpved from 13.0  Family Communication: husband at bedside on rounds  Disposition: Status is: Inpatient Remains inpatient appropriate because: remains on IV heparin with additional vascular procedure planned Friday 6/28.   Planned  Discharge Destination: Home    Time spent: 42 minutes  Author: Pennie Banter, DO 11/13/2022 3:21 PM  For on call review [email or web address].

## 2022-11-13 NOTE — Assessment & Plan Note (Addendum)
Transition IV heparin >> PO Eliquis this evening. Vascular surgery following. Underwent LLE thrombectomy today (6/28). IVC filter placed 11/13/22.

## 2022-11-13 NOTE — TOC Benefit Eligibility Note (Signed)
Pharmacy Patient Advocate Encounter  Insurance verification completed.    The patient is insured through [employer]  Ran test claim for Eliquis 5 mg and the current 30 day co-pay is $30.00.  Ran test claim for Xarelto 20 mg and the current 30 day co-pay is $30.00.   This test claim was processed through [facility]- copay amounts may vary at other pharmacies due to pharmacy/plan contracts, or as the patient moves through the different stages of their insurance plan.    Roland Earl, CPHT Pharmacy Patient Advocate Specialist [facility] Patient Advocate Team Direct Number: [phone]  Fax: [phone]

## 2022-11-13 NOTE — Progress Notes (Signed)
PHARMACIST - PHYSICIAN COMMUNICATION  CONCERNING: IV to Oral Route Change Policy  RECOMMENDATION: This patient is receiving pantoprazole by the intravenous route.  Based on criteria approved by the Pharmacy and Therapeutics Committee, the intravenous medication(s) is/are being converted to the equivalent oral dose form(s).  DESCRIPTION: These criteria include: The patient is eating (either orally or via tube) and/or has been taking other orally administered medications for a least 24 hours The patient has no evidence of active gastrointestinal bleeding or impaired GI absorption (gastrectomy, short bowel, patient on TNA or NPO).  If you have questions about this conversion, please contact the Pharmacy Department   [name], [facility] 11/13/2022 8:40 AM

## 2022-11-13 NOTE — Progress Notes (Signed)
Patient off unit for procedure.

## 2022-11-13 NOTE — Consult Note (Signed)
Hospital Consult    Reason for Consult:  Pulmonary Embolism with Deep Vein Thrombosis Requesting Physician:  Dr. Chesley Noon MD MRN #:  [id number]  History of Present Illness: This is a 52 y.o. female Cynthia Mccarthy is a 52 y.o. female with medical history significant for leg pain and SOB. She also has a past medical history of hypertension, diabetes, hypothyroidism, and DVT who presents to the ED complaining of leg pain. Patient reports that she has had approximately 1 week of discomfort and swelling around her left calf.  Pt states she thought it was her being lazy , but left leg pain is getting worse with swelling. Pt had tongue surgery 2 weeks ago.  In ed pt is alert awake and gives history pt has h/o DM II/ and DVT that was provoked.   On exam this morning patient is resting comfortably in bed in ICU. She is not requiring oxygen at this time. Saturations are 98% on room air. Normal Respiratory effort. Patient denies any pain this morning. She denies any chest pain or shortness of breath at rest. She denies any dizziness, nausea or vomiting. No complaints over night. Vitals all remain stable.   Past Medical History:  Diagnosis Date   Anxiety    Cervical cancer (HCC)    Depression    Diabetes mellitus without complication (HCC)    DVT (deep venous thrombosis) (HCC)    Hormone disorder    Hypertension    Hypothyroidism     Past Surgical History:  Procedure Laterality Date   ABDOMINAL HYSTERECTOMY      Allergies  Allergen Reactions   Penicillins Rash    Has patient had a PCN reaction causing immediate rash, facial/tongue/throat swelling, SOB or lightheadedness with hypotension: Yes Has patient had a PCN reaction causing severe rash involving mucus membranes or skin necrosis: No Has patient had a PCN reaction that required hospitalization: No Has patient had a PCN reaction occurring within the last 10 years: Yes If all of the above answers are "NO", then may proceed with  Cephalosporin use.   Melatonin     Other reaction(s): Other (See Comments), Other (See Comments) Pt. States have nightmares Pt. States have nightmares    Sulfa Antibiotics Hives and Rash    Prior to Admission medications   Medication Sig Start Date End Date Taking? Authorizing Provider  acetaminophen (TYLENOL) 325 MG tablet Take 2 tablets (650 mg total) by mouth every 6 (six) hours as needed for mild pain (or Fever >/= 101). 06/05/17  Yes Wieting, Richard, MD  albuterol (PROVENTIL) (2.5 MG/3ML) 0.083% nebulizer solution Take 3 mLs (2.5 mg total) by nebulization every 2 (two) hours as needed for wheezing. 06/05/17  Yes Wieting, Richard, MD  busPIRone (BUSPAR) 10 MG tablet Take 10 mg by mouth 2 (two) times daily.   Yes [provider]  cariprazine (VRAYLAR) 1.5 MG capsule Take 1.5 mg by mouth daily.   Yes [provider]  cetirizine (ZYRTEC) 10 MG tablet Take 10 mg by mouth daily.    Yes [provider]  chlorhexidine (PERIDEX) 0.12 % solution Use as directed 15 mLs in the mouth or throat 2 (two) times daily. Swish 15 mL by Mouth, then spit out. Do this two (2) times a day for 14 days. 10/29/22 11/14/22 Yes [provider]  citalopram (CELEXA) 40 MG tablet Take 40 mg by mouth daily. 06/19/21  Yes [provider]  famotidine (PEPCID) 40 MG tablet Take 40 mg by mouth daily. 08/17/21  Yes [provider]  fluticasone (FLONASE) 50 MCG/ACT nasal spray Place 2 sprays into both nostrils daily.    Yes [provider]  levothyroxine (SYNTHROID) 100 MCG tablet Take 100 mcg by mouth daily before breakfast. 07/04/22  Yes [provider]  metFORMIN (GLUCOPHAGE) 500 MG tablet Take 500 mg by mouth daily. 08/10/21  Yes [provider]  mupirocin ointment (BACTROBAN) 2 % Apply 1 Application topically. 1 Application Topical 2-3 Times Daily 10/10/22  Yes [provider]  ondansetron (ZOFRAN) 4 MG tablet Take 1 tablet (4 mg total) by  mouth every 6 (six) hours as needed for nausea. 06/05/17  Yes Wieting, Richard, MD  oxyCODONE (OXY IR/ROXICODONE) 5 MG immediate release tablet Take 5 mg by mouth every 6 (six) hours as needed. 10/29/22  Yes [provider]  rosuvastatin (CRESTOR) 5 MG tablet Take 5 mg by mouth daily. 08/10/21  Yes [provider]  SYMBICORT 160-4.5 MCG/ACT inhaler Inhale into the lungs. 08/03/21  Yes [provider]  traZODone (DESYREL) 100 MG tablet Take 100 mg by mouth at bedtime. Take 100 mg by mouth nightly Take 2 tabs if needed   Yes [provider]  triamcinolone ointment (KENALOG) 0.5 % Apply 1 Application topically 2 (two) times daily. 02/06/22  Yes Eusebio Friendly B, PA-C  vitamin B-12 (CYANOCOBALAMIN) 1000 MCG tablet Take by mouth. 03/29/21  Yes [provider]  Catheters (INTERMITTENT 14FR/40CM) MISC Use for urinary self-catheterization 3 times a day as directed 09/21/14   [provider]  sodium chloride 0.9 % infusion Inject 75 mLs into the vein continuous. 06/05/17   Alford Highland, MD    Social History   Socioeconomic History   Marital status: Married    Spouse name: Not on file   Number of children: Not on file   Years of education: Not on file   Highest education level: Not on file  Occupational History   Not on file  Tobacco Use   Smoking status: Never   Smokeless tobacco: Never  Vaping Use   Vaping Use: Never used  Substance and Sexual Activity   Alcohol use: Yes   Drug use: No   Sexual activity: Not on file  Other Topics Concern   Not on file  Social History Narrative   Not on file   Social Determinants of Health   Financial Resource Strain: Not on file  Food Insecurity: No Food Insecurity (11/12/2022)   Hunger Vital Sign    Worried About Running Out of Food in the Last Year: Never true    Ran Out of Food in the Last Year: Never true  Transportation Needs: No Transportation Needs (11/12/2022)   PRAPARE - Therapist, art (Medical): No    Lack of Transportation (Non-Medical): No  Physical Activity: Not on file  Stress: Not on file  Social Connections: Not on file  Intimate Partner Violence: Not At Risk (11/12/2022)   Humiliation, Afraid, Rape, and Kick questionnaire    Fear of Current or Ex-Partner: No    Emotionally Abused: No    Physically Abused: No    Sexually Abused: No     Family History  Problem Relation Age of Onset   Bladder Cancer Neg Hx    Kidney cancer Neg Hx     ROS: Otherwise negative unless mentioned in HPI  Physical Examination  Vitals:   11/13/22 0600 11/13/22 0700  BP: 129/68   Pulse: 61 76  Resp: 17 18  Temp:  SpO2: 98% 96%   Body mass index is 35 kg/m.  General:  WDWN in NAD Gait: Not observed HENT: WNL, normocephalic Pulmonary: normal non-labored breathing, without Rales, rhonchi,  wheezing Cardiac: regular, without  Murmurs, rubs or gallops; without carotid bruits Abdomen: Positive bowel Sounds, soft, NT/ND, no masses Skin: without rashes Vascular Exam/Pulses: Palpable pulses throughout Extremities: without ischemic changes, without Gangrene , without cellulitis; without open wounds;  Musculoskeletal: no muscle wasting or atrophy  Neurologic: A&O X 3;  No focal weakness or paresthesias are detected; speech is fluent/normal Psychiatric:  The pt has Normal affect. Lymph:  Unremarkable  CBC    Component Value Date/Time   WBC 11.2 (H) 11/13/2022 0158   RBC 3.96 11/13/2022 0158   HGB 11.6 (L) 11/13/2022 0158   HCT 34.5 (L) 11/13/2022 0158   PLT 179 11/13/2022 0158   MCV 87.1 11/13/2022 0158   MCH 29.3 11/13/2022 0158   MCHC 33.6 11/13/2022 0158   RDW 12.4 11/13/2022 0158   LYMPHSABS 2.3 11/12/2022 1558   MONOABS 0.9 11/12/2022 1558   EOSABS 0.4 11/12/2022 1558   BASOSABS 0.1 11/12/2022 1558    BMET    Component Value Date/Time   NA 137 11/13/2022 0158   K 3.3 (L) 11/13/2022 0158   CL 106 11/13/2022 0158   CO2 22  11/13/2022 0158   GLUCOSE 119 (H) 11/13/2022 0158   BUN 21 (H) 11/13/2022 0158   CREATININE 1.22 (H) 11/13/2022 0158   CALCIUM 8.4 (L) 11/13/2022 0158   GFRNONAA 53 (L) 11/13/2022 0158   GFRAA 15 (L) 06/07/2017 2209    COAGS: Lab Results  Component Value Date   INR 1.2 11/12/2022   INR 1.22 06/07/2017   INR 1.22 06/07/2017     Non-Invasive Vascular Imaging:   EXAM:11/12/2022 LEFT LOWER EXTREMITY VENOUS DOPPLER ULTRASOUND   TECHNIQUE: Gray-scale sonography with graded compression, as well as color Doppler and duplex ultrasound were performed to evaluate the lower extremity deep venous systems from the level of the common femoral vein and including the common femoral, femoral, profunda femoral, popliteal and calf veins including the posterior tibial, peroneal and gastrocnemius veins when visible. Spectral Doppler was utilized to evaluate flow at rest and with distal augmentation maneuvers in the common femoral, femoral and popliteal veins.   COMPARISON:  None Available.   FINDINGS: Contralateral Common Femoral Vein: Respiratory phasicity is normal and symmetric with the symptomatic side. No evidence of thrombus. Normal compressibility.   Common Femoral Vein: Intraluminal echogenic thrombus. Vessel partially compressible. Phasic flow noted. Augmentation not performed.   Saphenofemoral Junction: No evidence of thrombus. Normal compressibility and flow on color Doppler imaging.   Profunda Femoral Vein: Ill-defined intraluminal thrombus. Vessel partially compressible. Phasic flow maintained. Augmentation not performed.   Femoral Vein: Mixed echogenicity intraluminal thrombus. Thrombus appears occlusive. Vessel noncompressible.   Popliteal Vein: Similar mixed echogenicity intraluminal thrombus. Thrombus appearing occlusive. Vessel noncompressible.   Calf Veins: Limited assessment but thrombus does appear to extend into the tibial peroneal calf veins. Vessels are  noncompressible.   IMPRESSION: Positive exam for extensive left femoropopliteal DVT extending into the calf veins.  EXAM:11/12/2022 CT ANGIOGRAPHY CHEST WITH CONTRAST   TECHNIQUE: Multidetector CT imaging of the chest was performed using the standard protocol during bolus administration of intravenous contrast. Multiplanar CT image reconstructions and MIPs were obtained to evaluate the vascular anatomy.   RADIATION DOSE REDUCTION: This exam was performed according to the departmental dose-optimization program which includes automated exposure control, adjustment of the mA and/or kV  according to patient size and/or use of iterative reconstruction technique.   CONTRAST:  75mL OMNIPAQUE IOHEXOL 350 MG/ML SOLN   COMPARISON:  CT abdomen 06/01/2017   FINDINGS: Cardiovascular: SVC patent. Heart size normal. Borderline RV dilatation, RV/LV ratio 1.06. scattered central and segmental partially occlusive PE involving all lobes. Adequate contrast opacification of the thoracic aorta with no evidence of dissection, aneurysm, or stenosis. There is classic 3-vessel brachiocephalic arch anatomy without proximal stenosis.   Mediastinum/Nodes: No enlarged mediastinal, hilar, or axillary lymph nodes. Thyroid gland, trachea, and esophagus demonstrate no significant findings.   Lungs/Pleura: No pleural effusion. No pneumothorax. Lungs are clear.   Upper Abdomen: Mild left hydronephrosis.   Musculoskeletal: No chest wall abnormality. No acute or significant osseous findings.   Review of the MIP images confirms the above findings.   IMPRESSION: 1. Positive for acute PE with CT evidence of right heart strain (RV/LV Ratio = 1.06) consistent with at least submassive (intermediate risk) PE. The presence of right heart strain has been associated with an increased risk of morbidity and mortality. Please refer to the "Code PE Focused" order set in EPIC. 2. Mild left hydronephrosis.  Statin:   Yes.   Beta Blocker:  No. Aspirin:  No. ACEI:  No. ARB:  No. CCB use:  No Other antiplatelets/anticoagulants:  No.    ASSESSMENT/PLAN: This is a 52 y.o. female Who presents to [facility] ED with 2-3 weeks of left lower extremity pain and swelling. Upon workup she was found to have a left lower extremity DVT in her popliteal vein extending up to her femoral vein. She was also note to have a pulmonary embolism as an extension of her DVT.   PLAN: Vascular Surgery is planning on taking the patient to the vascular lab today 11/13/2022 for a pulmonary thrombectomy with IVC filter placement. [facility] also planning on taking her back to the vascular lab on Friday 11/15/2022 to perform a mechanical thrombectomy of her left lower extremity DVT. I discussed in detail with the patient this morning both procedures, the benefits, risks and complication. She verbalized her understanding. I answered all her questions. She would like to proceed as soon as possible. Patient has been NPO since midnight.   -I discussed the plan in detail with Dr Festus Barren MD and he is in agreement with the plan.    Marcie Bal [facility] 11/13/2022 7:18 AM

## 2022-11-13 NOTE — Interval H&P Note (Signed)
History and Physical Interval Note:  11/13/2022 11:44 AM  Cynthia Mccarthy  has presented today for surgery, with the diagnosis of Pulmonary Embolism.  The various methods of treatment have been discussed with the patient and family. After consideration of risks, benefits and other options for treatment, the patient has consented to  Procedure(s): PULMONARY THROMBECTOMY (Bilateral) IVC FILTER INSERTION (N/A) as a surgical intervention.  The patient's history has been reviewed, patient examined, no change in status, stable for surgery.  I have reviewed the patient's chart and labs.  Questions were answered to the patient's satisfaction.     Festus Barren

## 2022-11-13 NOTE — Op Note (Signed)
[facility]  Percutaneous Study/Intervention Procedural Note   Date of Surgery: 11/13/2022,1:19 PM  Surgeon: Festus Barren  Pre-operative Diagnosis: Symptomatic bilateral pulmonary emboli, LE DVT  Post-operative diagnosis:  Same  Procedure(s) Performed:  1.  Contrast injection right heart  2.  IVC filter placement  3.  Mechanical thrombectomy using the penumbra CAT 12 device to the right lower lobe, right middle lobe, and right upper lobe pulmonary arteries and the left upper and left lower lobe pulmonary arteries  4.  Selective catheter placement right lower lobe, middle lobe, and upper lobe pulmonary arteries  5.  Selective catheter placement left lower lobe and upper lobe pulmonary arteries    Anesthesia: Conscious sedation was administered under my direct supervision by the interventional radiology RN. IV Versed plus fentanyl were utilized. Continuous ECG, pulse oximetry and blood pressure was monitored throughout the entire procedure.  Versed and fentanyl were administered intravenously.  Conscious sedation was administered for a total of 32 minutes using 2 mg of Versed and 75 mcg of Fentanyl.  EBL: 300 cc  Sheath: 12 French right femoral vein  Contrast: 65 cc   Fluoroscopy Time: 10.2 minutes  Indications:  Patient presents with pulmonary emboli. The patient is symptomatic with hypoxemia and dyspnea on exertion.  There is evidence of right heart strain on the CT angiogram. The patient is otherwise a good candidate for intervention and even the long-term benefits pulmonary angiography with thrombolysis is offered. The risks and benefits are reviewed long-term benefits are discussed. All questions are answered patient agrees to proceed.  Procedure:  Cynthia Mccarthy a 52 y.o. female who was identified and appropriate procedural time out was performed.  The patient was then placed supine on the table and prepped and draped in the usual sterile fashion.   Ultrasound was used to evaluate the right common femoral vein.  It was patent.  A digital ultrasound image was acquired for the permanent record.  A Seldinger needle was used to access the right common femoral vein under direct ultrasound guidance.  A 0.035 J wire was advanced without resistance and a 5Fr sheath was placed.  A Pro-glide device was placed in a preclosed fashion and then upsized to an 12 [place] sheath.    The wire and pigtail catheter were then negotiated into the right atrium and bolus injection of contrast was utilized to demonstrate the right ventricle and the pulmonary artery outflow. The wire and catheter were then negotiated into the main pulmonary artery and then into the main pulmonary artery branches for selective imaging.  The advantage wire and pigtail catheter used.  The left lower lobe and left upper lobe were cannulated first.  Selective imaging showed significant thrombus burden more in the left lower lobe than the left upper lobe.  I then transition to the right side with the help of the advantage wire.  The right lower lobe was cannulated first followed by the right middle and upper lobe.  Selective imaging showed the large thrombus burden in the right lower lobe with some degree of thrombus burden in the right middle and upper lobes as well.  4000 units of heparin was then given and allowed to circulate.   The Penumbra Cat 12 catheter was then advanced up into the pulmonary vasculature. The right lung was addressed first. Catheter was negotiated into the right lower lobe and mechanical thrombectomy was performed with a separator. Follow-up imaging demonstrated a good result and therefore the catheter was renegotiated into the  right middle lobe pulmonary artery and again mechanical thrombectomy was performed. Passes were made with both the Penumbra catheter itself as well as introducing the separator.  Finally, I was able to cannulate the right upper lobe for mechanical  thrombectomy with the help separator.  Follow-up imaging was then performed.  Marked improvement was seen on the right side with only a small amount of residual thrombus.  The Penumbra Cat 12 catheter was then negotiated to the opposite side. The left lung was then addressed. Catheter was negotiated into the left upper lobe and mechanical thrombectomy was performed using the separator. Follow-up imaging demonstrated a good result and therefore the catheter was renegotiated into the left lower lobe pulmonary artery and again mechanical thrombectomy was performed. Passes were made with both the Penumbra catheter itself as well as introducing the separator. Follow-up imaging was then performed.  Thrombus was still present mostly in the left lower lobe but significant improvement was seen and we elected to terminate the procedure.  The penumbra CAT 12 catheter was then removed over a supra core wire.  Imaging through the right femoral sheath was performed showing the right iliac veins to be patent and the IVC to be patent.  The level of the renal veins was at the top of L1.  An option Elite IVC filter was then placed at L2.  The delivery system was then removed.  After review these images wires were reintroduced and the catheters removed. Then, the sheath is then pulled, proglide is secured, and pressure is held. A sterile dressing    Findings:   Right heart imaging:  Right atrium and right ventricle and the pulmonary outflow tract appears mildly dilated.  Right lung:   Selective imaging showed the large thrombus burden in the right lower lobe with some degree of thrombus burden in the right middle and upper lobes as well.  Left lung:  Selective imaging showed significant thrombus burden more in the left lower lobe than the left upper lobe.    Disposition: Patient was taken to the recovery room in stable condition having tolerated the procedure well.  Kary Colaizzi 11/13/2022,1:19 PM

## 2022-11-13 NOTE — Hospital Course (Signed)
HPI on admission 11/12/22 by Dr. Renaldo Reel: "Cynthia Mccarthy is a 52 y.o. female with medical history significant for leg pain and SOB. Pt states she thought it was her being lazy , but left leg pain is getting worse with swelling. Pt had tongue surgery 2 weeks ago.  In ed pt is alert awake and gives history pt has h/o DM II/ and DVT that was provoked.  In Ed pt is afebrile. Labs shows hypokalemia of 3.3 and CKD stable at 1.3 improved from baseline of 3's.Normal LFT, Leucocytosis of 13."  Pt was found to have at least submassive acute PE on CTA chest with evidence of right heart strain, involving all lobar arteries.   Lower extremity doppler U/S also revealed extensive left femoropopliteal DVT extending into the calf veins.  Pt was admitted to the hospital and started on IV heparin.    Vascular surgery was consulted. Patient underwent mechanical thrombectomy to the RLL, RML, RUL, LUL and LLL and IVC filter placement on 11/13/22.

## 2022-11-14 ENCOUNTER — Encounter: Payer: Self-pay | Admitting: Vascular Surgery

## 2022-11-14 ENCOUNTER — Inpatient Hospital Stay (HOSPITAL_COMMUNITY)
Admit: 2022-11-14 | Discharge: 2022-11-14 | Disposition: A | Discharge status: Home / Self Care | Payer: BC Managed Care – PPO | Attending: Vascular Surgery | Admitting: Vascular Surgery

## 2022-11-14 DIAGNOSIS — I2609 Other pulmonary embolism with acute cor pulmonale: Secondary | ICD-10-CM | POA: Diagnosis not present

## 2022-11-14 DIAGNOSIS — I824Y2 Acute embolism and thrombosis of unspecified deep veins of left proximal lower extremity: Secondary | ICD-10-CM | POA: Diagnosis not present

## 2022-11-14 DIAGNOSIS — I2699 Other pulmonary embolism without acute cor pulmonale: Secondary | ICD-10-CM | POA: Diagnosis not present

## 2022-11-14 DIAGNOSIS — N1831 Chronic kidney disease, stage 3a: Secondary | ICD-10-CM | POA: Diagnosis not present

## 2022-11-14 HISTORY — DX: Hypomagnesemia: E83.42

## 2022-11-14 LAB — BASIC METABOLIC PANEL
Anion gap: 9 (ref 5–15)
BUN: 18 mg/dL (ref 6–20)
CO2: 23 mmol/L (ref 22–32)
Calcium: 8.3 mg/dL — ABNORMAL LOW (ref 8.9–10.3)
Chloride: 106 mmol/L (ref 98–111)
Creatinine, Ser: 1.38 mg/dL — ABNORMAL HIGH (ref 0.44–1.00)
GFR, Estimated: 46 mL/min — ABNORMAL LOW (ref >60)
Glucose, Bld: 131 mg/dL — ABNORMAL HIGH (ref 70–99)
Potassium: 4 mmol/L (ref 3.5–5.1)
Sodium: 138 mmol/L (ref 135–145)

## 2022-11-14 LAB — CBC
HCT: 32.3 % — ABNORMAL LOW (ref 36.0–46.0)
Hemoglobin: 10.5 g/dL — ABNORMAL LOW (ref 12.0–15.0)
MCH: 28.8 pg (ref 26.0–34.0)
MCHC: 32.5 g/dL (ref 30.0–36.0)
MCV: 88.5 fL (ref 80.0–100.0)
Platelets: 203 10*3/uL (ref 150–400)
RBC: 3.65 MIL/uL — ABNORMAL LOW (ref 3.87–5.11)
RDW: 12.9 % (ref 11.5–15.5)
WBC: 10.2 10*3/uL (ref 4.0–10.5)
nRBC: 0 % (ref 0.0–0.2)

## 2022-11-14 LAB — ECHOCARDIOGRAM COMPLETE BUBBLE STUDY
AR max vel: 2.25 cm2
AV Area VTI: 2.27 cm2
AV Area mean vel: 2.07 cm2
AV Mean grad: 5 mmHg
AV Peak grad: 10.8 mmHg
Ao pk vel: 1.64 m/s
Area-P 1/2: 2.56 cm2
MV VTI: 2.57 cm2
S' Lateral: 2.6 cm

## 2022-11-14 LAB — GLUCOSE, CAPILLARY
Glucose-Capillary: 127 mg/dL — ABNORMAL HIGH (ref 70–99)
Glucose-Capillary: 169 mg/dL — ABNORMAL HIGH (ref 70–99)
Glucose-Capillary: 132 mg/dL — ABNORMAL HIGH (ref 70–99)
Glucose-Capillary: 126 mg/dL — ABNORMAL HIGH (ref 70–99)

## 2022-11-14 LAB — HEPARIN LEVEL (UNFRACTIONATED): Heparin Unfractionated: 0.48 IU/mL (ref 0.30–0.70)

## 2022-11-14 LAB — MAGNESIUM: Magnesium: 1.6 mg/dL — ABNORMAL LOW (ref 1.7–2.4)

## 2022-11-14 LAB — HEMOGLOBIN A1C
Hgb A1c MFr Bld: 7.3 % — ABNORMAL HIGH (ref 4.8–5.6)
Mean Plasma Glucose: 163 mg/dL

## 2022-11-14 MED ORDER — MAGNESIUM SULFATE 2 GM/50ML IV SOLN
2.0000 g | Freq: Once | INTRAVENOUS | Status: AC
  Administered 2022-11-14: 2 g via INTRAVENOUS
  Filled 2022-11-14: qty 50

## 2022-11-14 NOTE — Progress Notes (Signed)
ANTICOAGULATION CONSULT NOTE  Pharmacy Consult for Heparin Infusion Indication: pulmonary embolus  Patient Measurements: Height: 5\' 2"  (157.5 cm) Weight: 88.8 kg (195 lb 12.3 oz) IBW/kg (Calculated) : 50.1 Heparin Dosing Weight: 70.4 kg  Labs: Recent Labs    11/12/22 1558 11/12/22 1558 11/12/22 1842 11/13/22 0158 11/13/22 1008 11/13/22 1945 11/14/22 0418  HGB 12.3  --   --  11.6*  --   --  10.5*  HCT 38.5  --   --  34.5*  --   --  32.3*  PLT 194  --   --  179  --   --  203  APTT  --   --  27  --   --   --   --   LABPROT  --   --  14.9  --   --   --   --   INR  --   --  1.2  --   --   --   --   HEPARINUNFRC  --    < >  --  1.08* 0.52 0.50 0.48  CREATININE 1.33*  --   --  1.22*  --   --   --   TROPONINIHS 5  --  4  --   --   --   --    < > = values in this interval not displayed.    Estimated Creatinine Clearance: 55.2 mL/min (A) (by C-G formula based on SCr of 1.22 mg/dL (H)).  Medical History: Past Medical History:  Diagnosis Date   Anxiety    Cervical cancer (HCC)    Depression    Diabetes mellitus without complication (HCC)    DVT (deep venous thrombosis) (HCC)    Hormone disorder    Hypertension    Hypothyroidism    Assessment:  Cynthia Mccarthy is a 52 y.o. female presenting with leg pain. PMH significant for HTN, T2DM, GERD, hypothyroidism, CKD3a. Patient was not on [facility] PTA per chart review. CT angio positive for acute PE with CT evidence of right heart strain. Pharmacy has been consulted to initiate and manage heparin infusion.   Baseline Labs: aPTT 27, PT 14.9, INR 1.2, Hgb 12.3, Hct 38.5, Plt 194   Goal of Therapy:  Heparin level 0.3-0.7 units/ml Monitor platelets by anticoagulation protocol: Yes   Date Time HL Rate/Comment  6/26 0158 1.08 1200/supratherapeutic 6/26 1008 0.52 1050/therapeutic x1 6/26 1945 0.50 1050/therapeutic x2 6/27 0418 0.48 Therapeutic x 3  Plan:  Continue heparin infusion at 1050 units/hr while therapeutic Recheck HL daily  w/ AM labs CBC daily while on heparin infusion   Otelia Sergeant, PharmD, [facility] 11/14/2022 5:33 AM

## 2022-11-14 NOTE — Progress Notes (Signed)
Progress Note   Patient: Cynthia Mccarthy:[id number] DOB: 04/24/1971 DOA: 11/12/2022     2 DOS: the patient was seen and examined on 11/14/2022   Brief hospital course: HPI on admission 11/12/22 by Dr. Renaldo Reel: "Cynthia Mccarthy is a 52 y.o. female with medical history significant for leg pain and SOB. Pt states she thought it was her being lazy , but left leg pain is getting worse with swelling. Pt had tongue surgery 2 weeks ago.  In ed pt is alert awake and gives history pt has h/o DM II/ and DVT that was provoked.  In Ed pt is afebrile. Labs shows hypokalemia of 3.3 and CKD stable at 1.3 improved from baseline of 3's.Normal LFT, Leucocytosis of 13."  Pt was found to have at least submassive acute PE on CTA chest with evidence of right heart strain, involving all lobar arteries.   Lower extremity doppler U/S also revealed extensive left femoropopliteal DVT extending into the calf veins.  Pt was admitted to the hospital and started on IV heparin.    Vascular surgery was consulted. Patient underwent mechanical thrombectomy to the RLL, RML, RUL, LUL and LLL and IVC filter placement on 11/13/22.  Assessment and Plan: * Acute pulmonary embolism (HCC) CTA positive for :segmental partially occlusive PE involving all lobes with CT evidence of right heart strain (RV/LV Ratio = 1.06) consistent with at least submassive (intermediate risk) PE. Echo - EF 60-65%, normal diastolics, normal RV function --Vascular surgery consulted  --Now s/p thrombectomy to all 5 lobar arteries & IVC filter placement --On IV heparin --Transition to DOAC when okay from vascular surgery standpoint   Hypomagnesemia Mg 1.6 this AM, 2 g IV Mg-sulfate ordered for replacement. --Monitor Mg level, replace PRN  Hypokalemia Resolved. Replaced on 6/26 for K 3.3  Monitor BMP, check Mg level  Acute deep vein thrombosis (DVT) of left lower extremity (HCC) On IV heparin. Vascular surgery following. Plan for LLE  thrombectomy on Friday. IVC filter placed today 11/13/22.  Hydronephrosis, left Likely chronic.  Renal U/S showed no hydronephrosis, LEFT extrarenal pelvis, prior cystectomy.  Stage 3a chronic kidney disease (HCC) Renal function stable. Monitor BMP    Hypothyroidism (acquired) Cont levothyroxine 100 mcg.   Gastroesophageal reflux disease IV PPI.  Essential hypertension BP's currently soft. Continue holding antihypertensives & resume when indicated. Maintain MAP > 65   Diabetes mellitus, type 2 (HCC) Sliding scale Novolog Hold metformin.         Subjective: Pt seen this AM, sitting up in bed.  She reports overall feeling well.  No CP, dyspnea, or other acute complaints.  Stable to transfer out of stepdown/ICU.   Physical Exam: Vitals:   11/14/22 0900 11/14/22 1000 11/14/22 1100 11/14/22 1200  BP:  113/68 122/73 120/72  Pulse: 72 73 84 72  Resp: 19 12 18 12   Temp:    98.3 F (36.8 C)  TempSrc:    Axillary  SpO2: 100% 97% 100% 100%  Weight:      Height:       General exam: awake, alert, no acute distress HEENT: moist mucus membranes, hearing grossly normal  Respiratory system: CTAB, no wheezes, rales or rhonchi, normal respiratory effort. On room air Cardiovascular system: normal S1/S2, RRR, no pedal edema.   Gastrointestinal system: soft, NT, ND Central nervous system: A&O x 3. no gross focal neurologic deficits, normal speech Extremities: moves all, no edema, normal tone, no calf or popliteal tenderness of left leg Skin: dry, intact, normal temperature  Psychiatry: normal mood, congruent affect, judgement and insight appear normal  Data Reviewed:  Notable labs --- glucose 131, Cr 1... >> 1.38, Mg 1.6, Hbg 10.5  from 11.6  ECHO -- EF 60-65%, normal diastolic parameters, no significant valvular disease, no evidence of intraatrial shunt on agitate saline contrast bubble study.   Family Communication: husband at bedside on rounds  6/26   Disposition: Status is: Inpatient Remains inpatient appropriate because: remains on IV heparin with additional vascular procedure planned Friday 6/28.   Planned Discharge Destination: Home    Time spent: 42 minutes  Author: Pennie Banter, DO 11/14/2022 2:20 PM  For on call review [email or web address].

## 2022-11-14 NOTE — Assessment & Plan Note (Signed)
Mg 1.6 this AM, 2 g IV Mg-sulfate ordered for replacement. --Monitor Mg level, replace PRN

## 2022-11-14 NOTE — Progress Notes (Signed)
*  PRELIMINARY RESULTS* Echocardiogram 2D Echocardiogram has been performed.  Cynthia Mccarthy 11/14/2022, 10:29 AM

## 2022-11-15 ENCOUNTER — Encounter
Admission: EM | Disposition: A | Discharge status: Home / Self Care | Payer: Self-pay | Source: Home / Self Care | Attending: Internal Medicine

## 2022-11-15 DIAGNOSIS — I2699 Other pulmonary embolism without acute cor pulmonale: Secondary | ICD-10-CM | POA: Diagnosis not present

## 2022-11-15 DIAGNOSIS — I82412 Acute embolism and thrombosis of left femoral vein: Secondary | ICD-10-CM

## 2022-11-15 DIAGNOSIS — T82868A Thrombosis of vascular prosthetic devices, implants and grafts, initial encounter: Secondary | ICD-10-CM

## 2022-11-15 DIAGNOSIS — I824Y2 Acute embolism and thrombosis of unspecified deep veins of left proximal lower extremity: Secondary | ICD-10-CM | POA: Diagnosis not present

## 2022-11-15 DIAGNOSIS — Z9889 Other specified postprocedural states: Secondary | ICD-10-CM

## 2022-11-15 DIAGNOSIS — N1831 Chronic kidney disease, stage 3a: Secondary | ICD-10-CM | POA: Diagnosis not present

## 2022-11-15 DIAGNOSIS — I82422 Acute embolism and thrombosis of left iliac vein: Secondary | ICD-10-CM

## 2022-11-15 HISTORY — PX: PERIPHERAL VASCULAR THROMBECTOMY: CATH118306

## 2022-11-15 LAB — MAGNESIUM: Magnesium: 2 mg/dL (ref 1.7–2.4)

## 2022-11-15 LAB — BASIC METABOLIC PANEL
Anion gap: 8 (ref 5–15)
BUN: 15 mg/dL (ref 6–20)
CO2: 24 mmol/L (ref 22–32)
Calcium: 8.6 mg/dL — ABNORMAL LOW (ref 8.9–10.3)
Chloride: 109 mmol/L (ref 98–111)
Creatinine, Ser: 1.3 mg/dL — ABNORMAL HIGH (ref 0.44–1.00)
GFR, Estimated: 49 mL/min — ABNORMAL LOW (ref >60)
Glucose, Bld: 135 mg/dL — ABNORMAL HIGH (ref 70–99)
Potassium: 3.8 mmol/L (ref 3.5–5.1)
Sodium: 141 mmol/L (ref 135–145)

## 2022-11-15 LAB — CBC
HCT: 32.5 % — ABNORMAL LOW (ref 36.0–46.0)
Hemoglobin: 10.5 g/dL — ABNORMAL LOW (ref 12.0–15.0)
MCH: 28.7 pg (ref 26.0–34.0)
MCHC: 32.3 g/dL (ref 30.0–36.0)
MCV: 88.8 fL (ref 80.0–100.0)
Platelets: 205 10*3/uL (ref 150–400)
RBC: 3.66 MIL/uL — ABNORMAL LOW (ref 3.87–5.11)
RDW: 12.6 % (ref 11.5–15.5)
WBC: 8.9 10*3/uL (ref 4.0–10.5)
nRBC: 0 % (ref 0.0–0.2)

## 2022-11-15 LAB — HEPARIN LEVEL (UNFRACTIONATED): Heparin Unfractionated: 0.48 IU/mL (ref 0.30–0.70)

## 2022-11-15 LAB — GLUCOSE, CAPILLARY
Glucose-Capillary: 139 mg/dL — ABNORMAL HIGH (ref 70–99)
Glucose-Capillary: 124 mg/dL — ABNORMAL HIGH (ref 70–99)
Glucose-Capillary: 169 mg/dL — ABNORMAL HIGH (ref 70–99)
Glucose-Capillary: 158 mg/dL — ABNORMAL HIGH (ref 70–99)

## 2022-11-15 SURGERY — PERIPHERAL VASCULAR THROMBECTOMY
Anesthesia: Moderate Sedation | Laterality: Left

## 2022-11-15 MED ORDER — FENTANYL CITRATE (PF) 100 MCG/2ML IJ SOLN
INTRAMUSCULAR | Status: DC | PRN
  Administered 2022-11-15: 12.5 ug via INTRAVENOUS
  Administered 2022-11-15: 25 ug via INTRAVENOUS
  Administered 2022-11-15: 50 ug via INTRAVENOUS

## 2022-11-15 MED ORDER — ONDANSETRON HCL 4 MG/2ML IJ SOLN: 4.0000 mg | Freq: Four times a day (QID) | INTRAMUSCULAR | Status: DC | PRN

## 2022-11-15 MED ORDER — DIPHENHYDRAMINE HCL 50 MG/ML IJ SOLN: 50.0000 mg | Freq: Once | INTRAMUSCULAR | Status: DC | PRN

## 2022-11-15 MED ORDER — CEFAZOLIN SODIUM-DEXTROSE 2-4 GM/100ML-% IV SOLN
2.0000 g | INTRAVENOUS | Status: AC
  Administered 2022-11-15: 2 g via INTRAVENOUS

## 2022-11-15 MED ORDER — SODIUM CHLORIDE 0.9 % IV SOLN: INTRAVENOUS | Status: DC

## 2022-11-15 MED ORDER — APIXABAN 5 MG PO TABS: 5.0000 mg | ORAL_TABLET | Freq: Two times a day (BID) | ORAL | Status: DC

## 2022-11-15 MED ORDER — FENTANYL CITRATE PF 50 MCG/ML IJ SOSY
PREFILLED_SYRINGE | INTRAMUSCULAR | Status: AC
  Filled 2022-11-15: qty 1

## 2022-11-15 MED ORDER — APIXABAN 5 MG PO TABS: 10.0000 mg | ORAL_TABLET | Freq: Two times a day (BID) | ORAL | Status: DC

## 2022-11-15 MED ORDER — CEFAZOLIN SODIUM-DEXTROSE 2-4 GM/100ML-% IV SOLN
INTRAVENOUS | Status: AC
  Filled 2022-11-15: qty 100

## 2022-11-15 MED ORDER — HYDROMORPHONE HCL 1 MG/ML IJ SOLN: 1.0000 mg | Freq: Once | INTRAMUSCULAR | Status: DC | PRN

## 2022-11-15 MED ORDER — APIXABAN 5 MG PO TABS
10.0000 mg | ORAL_TABLET | Freq: Two times a day (BID) | ORAL | Status: DC
  Administered 2022-11-15 – 2022-11-16 (×2): 10 mg via ORAL
  Filled 2022-11-15 (×2): qty 2

## 2022-11-15 MED ORDER — MIDAZOLAM HCL 2 MG/2ML IJ SOLN
INTRAMUSCULAR | Status: AC
  Filled 2022-11-15: qty 2

## 2022-11-15 MED ORDER — MIDAZOLAM HCL 2 MG/ML PO SYRP: 8.0000 mg | ORAL_SOLUTION | Freq: Once | ORAL | Status: DC | PRN

## 2022-11-15 MED ORDER — METHYLPREDNISOLONE SODIUM SUCC 125 MG IJ SOLR: 125.0000 mg | Freq: Once | INTRAMUSCULAR | Status: DC | PRN

## 2022-11-15 MED ORDER — MIDAZOLAM HCL 2 MG/2ML IJ SOLN
INTRAMUSCULAR | Status: DC | PRN
  Administered 2022-11-15: 2 mg via INTRAVENOUS
  Administered 2022-11-15: 1 mg via INTRAVENOUS

## 2022-11-15 MED ORDER — FAMOTIDINE 20 MG PO TABS: 40.0000 mg | ORAL_TABLET | Freq: Once | ORAL | Status: DC | PRN

## 2022-11-15 MED ORDER — IODIXANOL 320 MG/ML IV SOLN
INTRAVENOUS | Status: DC | PRN
  Administered 2022-11-15: 80 mL via INTRAVENOUS

## 2022-11-15 MED ORDER — HEPARIN (PORCINE) IN NACL 2-0.9 UNITS/ML
INTRAMUSCULAR | Status: AC | PRN
  Administered 2022-11-15: 1000 mL

## 2022-11-15 MED ORDER — FENTANYL CITRATE PF 50 MCG/ML IJ SOSY: 12.5000 ug | PREFILLED_SYRINGE | Freq: Once | INTRAMUSCULAR | Status: DC | PRN

## 2022-11-15 MED ORDER — HEPARIN SODIUM (PORCINE) 1000 UNIT/ML IJ SOLN
INTRAMUSCULAR | Status: AC
  Filled 2022-11-15: qty 10

## 2022-11-15 MED ORDER — ZINC OXIDE 11.3 % EX CREA
1.0000 | TOPICAL_CREAM | Freq: Two times a day (BID) | CUTANEOUS | Status: DC | PRN
  Administered 2022-11-15: 1 via TOPICAL
  Filled 2022-11-15: qty 1

## 2022-11-15 SURGICAL SUPPLY — 15 items
BALLN ULTRVRSE 12X80X75 (BALLOONS) ×1
BALLOON ULTRVRSE 12X80X75 (BALLOONS) IMPLANT
CANISTER PENUMBRA ENGINE (MISCELLANEOUS) IMPLANT
CANNULA 5F STIFF (CANNULA) IMPLANT
CATH LIGHTNI FLASH 16XTORQ 100 (CATHETERS) IMPLANT
CATH LIGHTNING FLASH XTORQ 100 (CATHETERS) ×1
CLOSURE PERCLOSE PROSTYLE (VASCULAR PRODUCTS) IMPLANT
COVER PROBE ULTRASOUND 5X96 (MISCELLANEOUS) IMPLANT
KIT ENCORE 26 ADVANTAGE (KITS) IMPLANT
PACK ANGIOGRAPHY (CUSTOM PROCEDURE TRAY) ×1 IMPLANT
SHEATH BRITE TIP 6FRX11 (SHEATH) IMPLANT
SHEATH DRYSEAL FLEX 16FR 33CM (SHEATH) IMPLANT
SUT MNCRL AB 4-0 PS2 18 (SUTURE) IMPLANT
WIRE GUIDERIGHT .035X150 (WIRE) IMPLANT
WIRE SUPRACORE 190CM (WIRE) IMPLANT

## 2022-11-15 NOTE — TOC CM/SW Note (Signed)
Transition of Care [facility]) - Inpatient Brief Assessment   Patient Details  Name: KEESHIA BELARDE MRN: [id number] Date of Birth: 09-28-1970  [facility] CM/SW Contact:    Margarito Liner, LCSW Phone Number: 11/15/2022, 10:35 AM   Clinical Narrative: CSW has reviewed chart. No TOC needs identified at this time. CSW will continue to follow progress. Please place [facility] consult if any needs arise.  Transition of Care Asessment: Insurance and Status: Insurance coverage has been reviewed Patient has primary care physician: Yes Home environment has been reviewed: Single family home Prior level of function:: Not documented Prior/Current Home Services: No current home services Social Determinants of Health Reivew: SDOH reviewed no interventions necessary Readmission risk has been reviewed: Yes Transition of care needs: no transition of care needs at this time

## 2022-11-15 NOTE — Interval H&P Note (Signed)
History and Physical Interval Note:  11/15/2022 12:18 PM  Cynthia Mccarthy  has presented today for surgery, with the diagnosis of DVT.  The various methods of treatment have been discussed with the patient and family. After consideration of risks, benefits and other options for treatment, the patient has consented to  Procedure(s): PERIPHERAL VASCULAR THROMBECTOMY (Left) as a surgical intervention.  The patient's history has been reviewed, patient examined, no change in status, stable for surgery.  I have reviewed the patient's chart and labs.  Questions were answered to the patient's satisfaction.     Festus Barren

## 2022-11-15 NOTE — Progress Notes (Signed)
Progress Note   Patient: Cynthia Mccarthy ZOX:[id number] DOB: [birth year] DOA: 11/12/2022     3 DOS: the patient was seen and examined on 11/15/2022   Brief hospital course: HPI on admission 11/12/22 by Dr. Renaldo Reel: "Cynthia Mccarthy is a 52 y.o. female with medical history significant for leg pain and SOB. Pt states she thought it was her being lazy , but left leg pain is getting worse with swelling. Pt had tongue surgery 2 weeks ago.  In ed pt is alert awake and gives history pt has h/o DM II/ and DVT that was provoked.  In Ed pt is afebrile. Labs shows hypokalemia of 3.3 and CKD stable at 1.3 improved from baseline of 3's.Normal LFT, Leucocytosis of 13."  Pt was found to have at least submassive acute PE on CTA chest with evidence of right heart strain, involving all lobar arteries.   Lower extremity doppler U/S also revealed extensive left femoropopliteal DVT extending into the calf veins.  Pt was admitted to the hospital and started on IV heparin.    Vascular surgery was consulted. Patient underwent mechanical thrombectomy to the RLL, RML, RUL, LUL and LLL and IVC filter placement on 11/13/22.  Assessment and Plan: * Acute pulmonary embolism (HCC) CTA positive for :segmental partially occlusive PE involving all lobes with CT evidence of right heart strain (RV/LV Ratio = 1.06) consistent with at least submassive (intermediate risk) PE. Echo - EF 60-65%, normal diastolics, normal RV function --Vascular surgery consulted  --Now s/p thrombectomy to all 5 lobar arteries & IVC filter placement --Transition IV heparin >> PO Eliquis this evening --Transition to DOAC when okay from vascular surgery standpoint   Acute deep vein thrombosis (DVT) of left lower extremity (HCC) Transition IV heparin >> PO Eliquis this evening. Vascular surgery following. Underwent LLE thrombectomy today (6/28). IVC filter placed 11/13/22.  Hypomagnesemia Mg 1.6 this AM, 2 g IV Mg-sulfate ordered for  replacement. --Monitor Mg level, replace PRN  Hypokalemia Resolved. Replaced on 6/26 for K 3.3  Monitor BMP, check Mg level  Hydronephrosis, left Likely chronic.  Renal U/S showed no hydronephrosis, LEFT extrarenal pelvis, prior cystectomy.  Stage 3a chronic kidney disease (HCC) Renal function stable. Monitor BMP    Hypothyroidism (acquired) Cont levothyroxine 100 mcg.   Gastroesophageal reflux disease IV PPI.  Essential hypertension BP's currently soft. Continue holding antihypertensives & resume when indicated. Maintain MAP > 65   Diabetes mellitus, type 2 (HCC) Sliding scale Novolog Hold metformin.         Subjective: Pt seen this afternoon, husband at bedside, after procedure today.  Pt reports feeling well.  Felt a little lightheaded when standing up to transfer from bed to stretcher for procedure.  BP's are a bit soft.   No other acute complaints.  Hopeful to go home tomorrow.  Physical Exam: Vitals:   11/15/22 1415 11/15/22 1430 11/15/22 1445 11/15/22 1513  BP: 92/70 97/69  108/80  Pulse: 89 85 85 91  Resp: 18 13 16 14   Temp:    97.6 F (36.4 C)  TempSrc:    Axillary  SpO2: 93% 95% 95% 96%  Weight:      Height:       General exam: awake, alert, no acute distress HEENT: moist mucus membranes, hearing grossly normal  Respiratory system: Normal respiratory effort. On room air Cardiovascular system: RRR, no pedal edema.   Gastrointestinal system: soft, NT, ND Central nervous system: A&O x 3. no gross focal neurologic deficits, normal speech Extremities:  moves all, no edema, normal tone, no calf or popliteal tenderness of left leg Skin: dry, intact, normal temperature Psychiatry: normal mood, congruent affect, judgement and insight appear normal  Data Reviewed:  Notable labs --- glucose 135, Cr 1.38 >> 1.30,   Hbg stable 10.5  ECHO -- EF 60-65%, normal diastolic parameters, no significant valvular disease, no evidence of intraatrial shunt on  agitate saline contrast bubble study.  Procedures -  Pulmonary thrombectomy for acute Pe's - 6/26 Left lower extremity venous thrombectomy for DVT - 6/28   Family Communication: husband at bedside on rounds 6/26, 6/28   Disposition: Status is: Inpatient Remains inpatient appropriate because: remains on IV heparin with additional vascular procedure planned Friday 6/28.   Planned Discharge Destination: Home    Time spent: 42 minutes  Author: Pennie Banter, DO 11/15/2022 4:50 PM  For on call review [email or web address].

## 2022-11-15 NOTE — Op Note (Signed)
[facility]ASC[facility]   OPERATIVE NOTE   PRE-OPERATIVE DIAGNOSIS: extensive LLE DVT  POST-OPERATIVE DIAGNOSIS: same   PROCEDURE: 1.   US guidance for vascular access to left popliteal vein 2.   Catheter placement into left external iliac vein from left popliteal approach 3.   IVC gram and left lower extremity venogram 4.   Mechanical thrombectomy to to the left superficial femoral vein, common femoral vein, and common iliac vein with the penumbra 16 flash catheter 5.   PTA of left common iliac vein with 12 mm balloon    SURGEON: Festus Barren, MD  ASSISTANT(S): none  ANESTHESIA: local with moderate conscious sedation for 41 minutes using 3 mg of Versed and 87.5 mcg of Fentanyl  ESTIMATED BLOOD LOSS: 450 cc  FINDING(S): 1.  Some residual thrombus in the superficial femoral vein, extensive residual thrombus in the left common femoral vein with occlusive thrombus and stenosis in the left common iliac vein.  The IVC had a filter in place with a small amount of thrombus in the filter.  SPECIMEN(S):  none  INDICATIONS:    [name] who presents with extensive left lower extremity DVT.  She has already undergone pulmonary thrombectomy with IVC filter placement.  Patient has marked leg swelling and pain.  Venous intervention is performed to reduce the symtpoms and avoid long term postphlebitic symptoms.    DESCRIPTION: After obtaining full informed written consent, the patient was brought back to the vascular suite and placed supine upon the table. Moderate conscious sedation was administered during a face to face encounter with the patient throughout the procedure with my supervision of the RN administering medicines and monitoring the patient's vital signs, pulse oximetry, telemetry and mental status throughout from the start of the procedure until the patient was taken to the recovery room.  After obtaining adequate anesthesia, the patient was prepped and  draped in the standard fashion.    The patient was then placed into the prone position.  The left popliteal vein was then accessed under direct ultrasound guidance without difficulty with a micropuncture needle and a permanent image was recorded.  I then upsized to an 16 Fr sheath over a J wire after placement of a ProGlide device in a preclosed fashion.   Imaging showed extensive DVT with minimal flow.  A Kumpe catheter and Magic tourque wire were then advanced into the external iliac vein and images were performed.  There was some residual thrombus in the superficial femoral vein with extensive residual thrombus in the left common femoral vein and occlusive thrombus and stenosis in the left common iliac vein.  The IVC appeared to be largely patent with a small amount of thrombus in the filter.   I then used the Penumbra Cat 16 flash catheter and evacuated about 450 cc of effluent with mechanical thrombectomy throughout the iliac veins, CFV, and SFV.  This was an extensive thrombectomy with a large amount of thrombus removed.  The common femoral vein and superficial vein and common femoral vein were essentially cleared at this point.  I then turned my attention to the iliac veins.  The thrombus residual in the common iliac vein was still significant.  Another pass with the penumbra 16 flash catheter was used and more thrombus was removed but there remains stenosis at this location.  The stenosis/occlusion and thrombus was treated with a 12 mm diameter angioplasty balloon.  2 inflations were done in the left common iliac vein going  up into the IVC and back just to the most proximal external iliac vein to encompass the full segment.  Completion imaging showed marked improvement with only about a 20 to 30% residual stenosis and markedly improved flow.  I then elected to terminate the procedure.  The sheath was removed, the ProGlide device was secured and a 4-0 Monocryl was placed at the skin and a dressing was  placed.  She was taken to the recovery room in stable condition having tolerated the procedure well.    COMPLICATIONS: None  CONDITION: Stable  Festus Barren 11/15/2022 1:28 PM

## 2022-11-15 NOTE — Progress Notes (Signed)
ANTICOAGULATION CONSULT NOTE  Pharmacy Consult for Heparin Infusion Indication: pulmonary embolus  Patient Measurements: Height: 5\' 2"  (157.5 cm) Weight: 88.5 kg (195 lb 1.7 oz) IBW/kg (Calculated) : 50.1 Heparin Dosing Weight: 70.4 kg  Labs: Recent Labs    11/12/22 1558 11/12/22 1842 11/13/22 0158 11/13/22 1008 11/13/22 1945 11/14/22 0418 11/15/22 0614  HGB 12.3  --  11.6*  --   --  10.5* 10.5*  HCT 38.5  --  34.5*  --   --  32.3* 32.5*  PLT 194  --  179  --   --  203 205  APTT  --  27  --   --   --   --   --   LABPROT  --  14.9  --   --   --   --   --   INR  --  1.2  --   --   --   --   --   HEPARINUNFRC  --   --  1.08*   < > 0.50 0.48 0.48  CREATININE 1.33*  --  1.22*  --   --  1.38* 1.30*  TROPONINIHS 5 4  --   --   --   --   --    < > = values in this interval not displayed.    Estimated Creatinine Clearance: 51.7 mL/min (A) (by C-G formula based on SCr of 1.3 mg/dL (H)).  Medical History: Past Medical History:  Diagnosis Date   Anxiety    Cervical cancer (HCC)    Depression    Diabetes mellitus without complication (HCC)    DVT (deep venous thrombosis) (HCC)    Hormone disorder    Hypertension    Hypothyroidism    Assessment:  Cynthia Mccarthy is a 52 y.o. female presenting with leg pain. PMH significant for HTN, T2DM, GERD, hypothyroidism, CKD3a. Patient was not on [facility] PTA per chart review. CT angio positive for acute PE with CT evidence of right heart strain. Pharmacy has been consulted to initiate and manage heparin infusion.   Baseline Labs: aPTT 27, PT 14.9, INR 1.2, Hgb 12.3, Hct 38.5, Plt 194   Goal of Therapy:  Heparin level 0.3-0.7 units/ml Monitor platelets by anticoagulation protocol: Yes   Date Time HL Rate/Comment  6/26 0158 1.08 1200/supratherapeutic 6/26 1008 0.52 1050/therapeutic x1 6/26 1945 0.50 1050/therapeutic x2 6/27 0418 0.48 1050/therapeutic x3 6/28 0614 0.48 1050/therapeutic x4  Plan:  Continue heparin infusion at 1050  units/hr while therapeutic Recheck HL daily w/ AM labs CBC daily while on heparin infusion   Bettey Costa, PharmD Clinical Pharmacist 11/15/2022 7:22 AM

## 2022-11-16 LAB — CBC
HCT: 25.5 % — ABNORMAL LOW (ref 36.0–46.0)
Hemoglobin: 8.3 g/dL — ABNORMAL LOW (ref 12.0–15.0)
MCH: 29.1 pg (ref 26.0–34.0)
MCHC: 32.5 g/dL (ref 30.0–36.0)
MCV: 89.5 fL (ref 80.0–100.0)
Platelets: 183 10*3/uL (ref 150–400)
RBC: 2.85 MIL/uL — ABNORMAL LOW (ref 3.87–5.11)
RDW: 12.7 % (ref 11.5–15.5)
WBC: 9.4 10*3/uL (ref 4.0–10.5)
nRBC: 0 % (ref 0.0–0.2)

## 2022-11-16 LAB — BASIC METABOLIC PANEL
Anion gap: 7 (ref 5–15)
BUN: 16 mg/dL (ref 6–20)
CO2: 24 mmol/L (ref 22–32)
Calcium: 8.6 mg/dL — ABNORMAL LOW (ref 8.9–10.3)
Chloride: 107 mmol/L (ref 98–111)
Creatinine, Ser: 1.21 mg/dL — ABNORMAL HIGH (ref 0.44–1.00)
GFR, Estimated: 54 mL/min — ABNORMAL LOW (ref >60)
Glucose, Bld: 146 mg/dL — ABNORMAL HIGH (ref 70–99)
Potassium: 3.7 mmol/L (ref 3.5–5.1)
Sodium: 138 mmol/L (ref 135–145)

## 2022-11-16 LAB — HEMOGLOBIN AND HEMATOCRIT, BLOOD
HCT: 27.2 % — ABNORMAL LOW (ref 36.0–46.0)
Hemoglobin: 8.9 g/dL — ABNORMAL LOW (ref 12.0–15.0)

## 2022-11-16 LAB — GLUCOSE, CAPILLARY
Glucose-Capillary: 205 mg/dL — ABNORMAL HIGH (ref 70–99)
Glucose-Capillary: 152 mg/dL — ABNORMAL HIGH (ref 70–99)

## 2022-11-16 LAB — HEPARIN LEVEL (UNFRACTIONATED): Heparin Unfractionated: >1.1 IU/mL — ABNORMAL HIGH (ref 0.30–0.70)

## 2022-11-16 MED ORDER — CHLORHEXIDINE GLUCONATE 0.12 % MT SOLN: 15.0000 mL | Freq: Two times a day (BID) | OROMUCOSAL | 0 refills | Status: AC

## 2022-11-16 MED ORDER — APIXABAN (ELIQUIS) VTE STARTER PACK (10MG AND 5MG): ORAL_TABLET | ORAL | 0 refills | Status: DC

## 2022-11-16 MED ORDER — ZINC OXIDE 11.3 % EX CREA: 1.0000 | TOPICAL_CREAM | Freq: Two times a day (BID) | CUTANEOUS | 0 refills | Status: DC | PRN

## 2022-11-16 NOTE — Plan of Care (Signed)

## 2022-11-16 NOTE — Discharge Summary (Signed)
Physician Discharge Summary   Patient: Cynthia Mccarthy MRN: [id number] DOB: [birth year]  Admit date:     11/12/2022  Discharge date: 11/16/2022  Discharge Physician: Pennie Banter   PCP: Myrene Buddy, NP   Recommendations at discharge:  {Tip this will not be part of the note when signed- Example include specific recommendations for outpatient follow-up, pending tests to follow-up on. (Optional):26781}  Follow up with Primary Care in 1 week Repeat CBC, BMP within 1 week -- follow up anemia for improvement Follow up as scheduled with Vascular Surgery  Discharge Diagnoses: Principal Problem:   Acute pulmonary embolism (HCC) Active Problems:   Acute deep vein thrombosis (DVT) of left lower extremity (HCC)   Diabetes mellitus, type 2 (HCC)   Essential hypertension   Gastroesophageal reflux disease   Hypothyroidism (acquired)   Stage 3a chronic kidney disease (HCC)   Hydronephrosis, left   Hypokalemia   Hypomagnesemia  Resolved Problems:   * No resolved hospital problems. [facility] Course: HPI on admission 11/12/22 by Dr. Renaldo Reel: "Cynthia Mccarthy is a 52 y.o. female with medical history significant for leg pain and SOB. Pt states she thought it was her being lazy , but left leg pain is getting worse with swelling. Pt had tongue surgery 2 weeks ago.  In ed pt is alert awake and gives history pt has h/o DM II/ and DVT that was provoked.  In Ed pt is afebrile. Labs shows hypokalemia of 3.3 and CKD stable at 1.3 improved from baseline of 3's.Normal LFT, Leucocytosis of 13."  Pt was found to have at least submassive acute PE on CTA chest with evidence of right heart strain, involving all lobar arteries.   Lower extremity doppler U/S also revealed extensive left femoropopliteal DVT extending into the calf veins.  Pt was admitted to the hospital and started on IV heparin.    Vascular surgery was consulted. Patient underwent mechanical thrombectomy to the RLL, RML, RUL, LUL  and LLL and IVC filter placement on 11/13/22.  Assessment and Plan: * Acute pulmonary embolism (HCC) CTA positive for :segmental partially occlusive PE involving all lobes with CT evidence of right heart strain (RV/LV Ratio = 1.06) consistent with at least submassive (intermediate risk) PE. Echo - EF 60-65%, normal diastolics, normal RV function --Vascular surgery consulted  --Now s/p thrombectomy to all 5 lobar arteries & IVC filter placement --Transition IV heparin >> PO Eliquis this evening --Transition to DOAC when okay from vascular surgery standpoint   Acute deep vein thrombosis (DVT) of left lower extremity (HCC) Transition IV heparin >> PO Eliquis this evening. Vascular surgery following. Underwent LLE thrombectomy today (6/28). IVC filter placed 11/13/22.  Hypomagnesemia Mg 1.6 this AM, 2 g IV Mg-sulfate ordered for replacement. --Monitor Mg level, replace PRN  Hypokalemia Resolved. Replaced on 6/26 for K 3.3  Monitor BMP, check Mg level  Hydronephrosis, left Likely chronic.  Renal U/S showed no hydronephrosis, LEFT extrarenal pelvis, prior cystectomy.  Stage 3a chronic kidney disease (HCC) Renal function stable. Monitor BMP    Hypothyroidism (acquired) Cont levothyroxine 100 mcg.   Gastroesophageal reflux disease IV PPI.  Essential hypertension BP's currently soft. Continue holding antihypertensives & resume when indicated. Maintain MAP > 65   Diabetes mellitus, type 2 (HCC) Sliding scale Novolog Hold metformin.       {Tip this will not be part of the note when signed Body mass index is 36.65 kg/m. , ,  (Optional):26781}  {(NOTE) Pain control PDMP Statment (Optional):26782}  Consultants: *** Procedures performed: ***  Disposition: {Plan; Disposition:26390} Diet recommendation:  {Diet_Plan:26776} DISCHARGE MEDICATION: Allergies as of 11/16/2022       Reactions   Penicillins Rash   Has patient had a PCN reaction causing immediate rash,  facial/tongue/throat swelling, SOB or lightheadedness with hypotension: Yes Has patient had a PCN reaction causing severe rash involving mucus membranes or skin necrosis: No Has patient had a PCN reaction that required hospitalization: No Has patient had a PCN reaction occurring within the last 10 years: Yes If all of the above answers are "NO", then may proceed with Cephalosporin use.   Melatonin    Other reaction(s): Other (See Comments), Other (See Comments) Pt. States have nightmares Pt. States have nightmares   Sulfa Antibiotics Hives, Rash        Medication List     TAKE these medications    acetaminophen 325 MG tablet Commonly known as: TYLENOL Take 2 tablets (650 mg total) by mouth every 6 (six) hours as needed for mild pain (or Fever >/= 101).   albuterol (2.5 MG/3ML) 0.083% nebulizer solution Commonly known as: PROVENTIL Take 3 mLs (2.5 mg total) by nebulization every 2 (two) hours as needed for wheezing.   Apixaban Starter Pack (10mg  and 5mg ) Commonly known as: ELIQUIS STARTER PACK Take as directed on package: start with two-5mg  tablets twice daily for 7 days. On day 8, switch to one-5mg  tablet twice daily.   busPIRone 10 MG tablet Commonly known as: BUSPAR Take 10 mg by mouth 2 (two) times daily.   cariprazine 1.5 MG capsule Commonly known as: VRAYLAR Take 1.5 mg by mouth daily.   cetirizine 10 MG tablet Commonly known as: ZYRTEC Take 10 mg by mouth daily.   chlorhexidine 0.12 % solution Commonly known as: PERIDEX Use as directed 15 mLs in the mouth or throat 2 (two) times daily for 16 days. Swish 15 mL by Mouth, then spit out. Do this two (2) times a day for 14 days.   citalopram 40 MG tablet Commonly known as: CELEXA Take 40 mg by mouth daily.   cyanocobalamin 1000 MCG tablet Commonly known as: VITAMIN B12 Take by mouth.   famotidine 40 MG tablet Commonly known as: PEPCID Take 40 mg by mouth daily.   fluticasone 50 MCG/ACT nasal spray Commonly  known as: FLONASE Place 2 sprays into both nostrils daily.   Intermittent 14FR/40CM Misc Use for urinary self-catheterization 3 times a day as directed   levothyroxine 100 MCG tablet Commonly known as: SYNTHROID Take 100 mcg by mouth daily before breakfast.   metFORMIN 500 MG tablet Commonly known as: GLUCOPHAGE Take 500 mg by mouth daily.   mupirocin ointment 2 % Commonly known as: BACTROBAN Apply 1 Application topically. 1 Application Topical 2-3 Times Daily   ondansetron 4 MG tablet Commonly known as: ZOFRAN Take 1 tablet (4 mg total) by mouth every 6 (six) hours as needed for nausea.   oxyCODONE 5 MG immediate release tablet Commonly known as: Oxy IR/ROXICODONE Take 5 mg by mouth every 6 (six) hours as needed.   rosuvastatin 5 MG tablet Commonly known as: CRESTOR Take 5 mg by mouth daily.   sodium chloride 0.9 % infusion Inject 75 mLs into the vein continuous.   Symbicort 160-4.5 MCG/ACT inhaler Generic drug: budesonide-formoterol Inhale into the lungs.   traZODone 100 MG tablet Commonly known as: DESYREL Take 100 mg by mouth at bedtime. Take 100 mg by mouth nightly Take 2 tabs if needed   triamcinolone ointment 0.5 %  Commonly known as: KENALOG Apply 1 Application topically 2 (two) times daily.   zinc oxide 11.3 % Crea cream Commonly known as: BALMEX Apply 1 Application topically 2 (two) times daily as needed.        Follow-up Information     Gauger, Hermenia Fiscal, NP. Call.   Specialty: Internal Medicine Why: Follow up in 1-2 weeks. Please request lab visit for CBC by middle of this week Contact information: [address] [place] [address] [phone]         Annice Needy, MD. Call.   Specialties: Vascular Surgery, Radiology, Interventional Cardiology Why: Call to schedule follow up visit Contact information: [address] [place] [address] [phone]                Discharge Exam: Ceasar Mons Weights    11/15/22 0422 11/15/22 1139 11/16/22 0357  Weight: 88.5 kg 88.5 kg 85.7 kg   ***  Condition at discharge: {DC Condition:26389}  The results of significant diagnostics from this hospitalization (including imaging, microbiology, ancillary and laboratory) are listed below for reference.   Imaging Studies: PERIPHERAL VASCULAR CATHETERIZATION  Result Date: 11/15/2022 See surgical note for result.  ECHOCARDIOGRAM COMPLETE BUBBLE STUDY  Result Date: 11/14/2022    ECHOCARDIOGRAM REPORT   Patient Name:   DEVIDA FOTI Date of Exam: 11/14/2022 Medical Rec #:  [id number]        Height:       62.0 in Accession #:    [id number]       Weight:       195.8 lb Date of Birth:  1970/07/24         BSA:          1.894 m Patient Age:    51 years         BP:           100/65 mmHg Patient Gender: F                HR:           72 bpm. Exam Location:  [facility] Procedure: 2D Echo, Cardiac Doppler, Color Doppler and Saline Contrast Bubble            Study Indications:     Pulmonary embolus  History:         Patient has no prior history of Echocardiogram examinations.                  Risk Factors:Hypertension and Diabetes. Pulm embolus.  Sonographer:     Mikki Harbor Referring Phys:  [id number] Marlow Baars DEW Diagnosing Phys: Lorine Bears MD IMPRESSIONS  1. Left ventricular ejection fraction, by estimation, is 60 to 65%. The left ventricle has normal function. The left ventricle has no regional wall motion abnormalities. Left ventricular diastolic parameters were normal.  2. Right ventricular systolic function is normal. The right ventricular size is normal. Tricuspid regurgitation signal is inadequate for assessing PA pressure.  3. The mitral valve is normal in structure. No evidence of mitral valve regurgitation. No evidence of mitral stenosis.  4. The aortic valve is normal in structure. Aortic valve regurgitation is not visualized. No aortic stenosis is present.  5. The inferior vena cava is normal in size with greater than 50%  respiratory variability, suggesting right atrial pressure of 3 mmHg.  6. Agitated saline contrast bubble study was negative, with no evidence of any interatrial shunt. FINDINGS  Left Ventricle: Left ventricular ejection fraction, by estimation,  is 60 to 65%. The left ventricle has normal function. The left ventricle has no regional wall motion abnormalities. The left ventricular internal cavity size was normal in size. There is  no left ventricular hypertrophy. Left ventricular diastolic parameters were normal. Right Ventricle: The right ventricular size is normal. No increase in right ventricular wall thickness. Right ventricular systolic function is normal. Tricuspid regurgitation signal is inadequate for assessing PA pressure. Left Atrium: Left atrial size was normal in size. Right Atrium: Right atrial size was normal in size. Pericardium: Trivial pericardial effusion is present. The pericardial effusion is circumferential. Mitral Valve: The mitral valve is normal in structure. No evidence of mitral valve regurgitation. No evidence of mitral valve stenosis. MV peak gradient, 3.8 mmHg. The mean mitral valve gradient is 2.0 mmHg. Tricuspid Valve: The tricuspid valve is normal in structure. Tricuspid valve regurgitation is not demonstrated. No evidence of tricuspid stenosis. Aortic Valve: The aortic valve is normal in structure. Aortic valve regurgitation is not visualized. No aortic stenosis is present. Aortic valve mean gradient measures 5.0 mmHg. Aortic valve peak gradient measures 10.8 mmHg. Aortic valve area, by VTI measures 2.27 cm. Pulmonic Valve: The pulmonic valve was normal in structure. Pulmonic valve regurgitation is not visualized. No evidence of pulmonic stenosis. Aorta: The aortic root is normal in size and structure. Venous: The inferior vena cava is normal in size with greater than 50% respiratory variability, suggesting right atrial pressure of 3 mmHg. IAS/Shunts: No atrial level shunt detected by  color flow Doppler. Agitated saline contrast was given intravenously to evaluate for intracardiac shunting. Agitated saline contrast bubble study was negative, with no evidence of any interatrial shunt.  LEFT VENTRICLE PLAX 2D LVIDd:         4.10 cm   Diastology LVIDs:         2.60 cm   LV e' medial:    13.30 cm/s LV PW:         1.00 cm   LV E/e' medial:  5.8 LV IVS:        0.90 cm   LV e' lateral:   13.10 cm/s LVOT diam:     1.90 cm   LV E/e' lateral: 5.9 LV SV:         71 LV SV Index:   37 LVOT Area:     2.84 cm  RIGHT VENTRICLE RV Basal diam:  2.90 cm RV Mid diam:    3.30 cm RV S prime:     12.80 cm/s LEFT ATRIUM             Index        RIGHT ATRIUM           Index LA diam:        2.10 cm 1.11 cm/m   RA Area:     13.40 cm LA Vol (A2C):   28.1 ml 14.83 ml/m  RA Volume:   34.70 ml  18.32 ml/m LA Vol (A4C):   25.9 ml 13.67 ml/m LA Biplane Vol: 27.9 ml 14.73 ml/m  AORTIC VALVE                     PULMONIC VALVE AV Area (Vmax):    2.25 cm      PV Vmax:       1.22 m/s AV Area (Vmean):   2.07 cm      PV Peak grad:  6.0 mmHg AV Area (VTI):     2.27 cm AV Vmax:  164.00 cm/s AV Vmean:          105.000 cm/s AV VTI:            0.312 m AV Peak Grad:      10.8 mmHg AV Mean Grad:      5.0 mmHg LVOT Vmax:         130.00 cm/s LVOT Vmean:        76.700 cm/s LVOT VTI:          0.250 m LVOT/AV VTI ratio: 0.80  AORTA Ao Root diam: 2.50 cm MITRAL VALVE MV Area (PHT): 2.56 cm    SHUNTS MV Area VTI:   2.57 cm    Systemic VTI:  0.25 m MV Peak grad:  3.8 mmHg    Systemic Diam: 1.90 cm MV Mean grad:  2.0 mmHg MV Vmax:       0.97 m/s MV Vmean:      58.4 cm/s MV Decel Time: 296 msec MV E velocity: 77.00 cm/s MV A velocity: 83.30 cm/s MV E/A ratio:  0.92 Lorine Bears MD Electronically signed by Lorine Bears MD Signature Date/Time: 11/14/2022/1:47:35 PM    Final    PERIPHERAL VASCULAR CATHETERIZATION  Result Date: 11/13/2022 See surgical note for result.  US RENAL  Result Date: 11/13/2022 CLINICAL DATA:   Hydronephrosis.  Cystectomy 2019. EXAM: RENAL / URINARY TRACT ULTRASOUND COMPLETE COMPARISON:  CT angio chest 11/12/2022 and ultrasound renal 06/04/2017 FINDINGS: Right Kidney: Renal measurements: 11.1 x 4.1 x 5.2 centimeters = volume: 124 mL. Echogenicity within normal limits. No mass or hydronephrosis visualized. Left Kidney: Renal measurements: 11.0 x 3.9 x 4.3 centimeters = volume: 9.6 mL. No hydronephrosis. Extrarenal pelvis noted. No renal mass. Bladder: Urinary bladder is surgically absent. Other: None. IMPRESSION: 1. No hydronephrosis.  LEFT extrarenal pelvis. 2. Prior cystectomy Electronically Signed   By: Norva Pavlov M.D.   On: 11/13/2022 08:47   CT Angio Chest PE W/Cm &/Or Wo Cm  Result Date: 11/12/2022 CLINICAL DATA:  Left leg pain and swelling, recent tongue surgery, history of DVT EXAM: CT ANGIOGRAPHY CHEST WITH CONTRAST TECHNIQUE: Multidetector CT imaging of the chest was performed using the standard protocol during bolus administration of intravenous contrast. Multiplanar CT image reconstructions and MIPs were obtained to evaluate the vascular anatomy. RADIATION DOSE REDUCTION: This exam was performed according to the departmental dose-optimization program which includes automated exposure control, adjustment of the mA and/or kV according to patient size and/or use of iterative reconstruction technique. CONTRAST:  75mL OMNIPAQUE IOHEXOL 350 MG/ML SOLN COMPARISON:  CT abdomen 06/01/2017 FINDINGS: Cardiovascular: SVC patent. Heart size normal. Borderline RV dilatation, RV/LV ratio 1.06. scattered central and segmental partially occlusive PE involving all lobes. Adequate contrast opacification of the thoracic aorta with no evidence of dissection, aneurysm, or stenosis. There is classic 3-vessel brachiocephalic arch anatomy without proximal stenosis. Mediastinum/Nodes: No enlarged mediastinal, hilar, or axillary lymph nodes. Thyroid gland, trachea, and esophagus demonstrate no significant  findings. Lungs/Pleura: No pleural effusion. No pneumothorax. Lungs are clear. Upper Abdomen: Mild left hydronephrosis. Musculoskeletal: No chest wall abnormality. No acute or significant osseous findings. Review of the MIP images confirms the above findings. IMPRESSION: 1. Positive for acute PE with CT evidence of right heart strain (RV/LV Ratio = 1.06) consistent with at least submassive (intermediate risk) PE. The presence of right heart strain has been associated with an increased risk of morbidity and mortality. Please refer to the "Code PE Focused" order set in EPIC. 2. Mild left hydronephrosis. Electronically Signed   By:  Corlis Leak M.D.   On: 11/12/2022 17:41   US Venous Img Lower Unilateral Left  Result Date: 11/12/2022 CLINICAL DATA:  Left leg swelling EXAM: LEFT LOWER EXTREMITY VENOUS DOPPLER ULTRASOUND TECHNIQUE: Gray-scale sonography with graded compression, as well as color Doppler and duplex ultrasound were performed to evaluate the lower extremity deep venous systems from the level of the common femoral vein and including the common femoral, femoral, profunda femoral, popliteal and calf veins including the posterior tibial, peroneal and gastrocnemius veins when visible. Spectral Doppler was utilized to evaluate flow at rest and with distal augmentation maneuvers in the common femoral, femoral and popliteal veins. COMPARISON:  None Available. FINDINGS: Contralateral Common Femoral Vein: Respiratory phasicity is normal and symmetric with the symptomatic side. No evidence of thrombus. Normal compressibility. Common Femoral Vein: Intraluminal echogenic thrombus. Vessel partially compressible. Phasic flow noted. Augmentation not performed. Saphenofemoral Junction: No evidence of thrombus. Normal compressibility and flow on color Doppler imaging. Profunda Femoral Vein: Ill-defined intraluminal thrombus. Vessel partially compressible. Phasic flow maintained. Augmentation not performed. Femoral Vein:  Mixed echogenicity intraluminal thrombus. Thrombus appears occlusive. Vessel noncompressible. Popliteal Vein: Similar mixed echogenicity intraluminal thrombus. Thrombus appearing occlusive. Vessel noncompressible. Calf Veins: Limited assessment but thrombus does appear to extend into the tibial peroneal calf veins. Vessels are noncompressible. IMPRESSION: Positive exam for extensive left femoropopliteal DVT extending into the calf veins. Electronically Signed   By: Judie Petit.  Shick M.D.   On: 11/12/2022 15:19    Microbiology: Results for orders placed or performed during the hospital encounter of 11/12/22  MRSA Next Gen by PCR, Nasal     Status: None   Collection Time: 11/12/22  8:12 PM   Specimen: Nasal Mucosa; Nasal Swab  Result Value Ref Range Status   MRSA by PCR Next Gen NOT DETECTED NOT DETECTED Final    Comment: (NOTE) The GeneXpert MRSA Assay (FDA approved for NASAL specimens only), is one component of a comprehensive MRSA colonization surveillance program. It is not intended to diagnose MRSA infection nor to guide or monitor treatment for MRSA infections. Test performance is not FDA approved in patients less than 72 years old. Performed at [facility], [address] [place], [place] [address]     Labs: CBC: Recent Labs  Lab 11/12/22 1558 11/13/22 0158 11/14/22 0418 11/15/22 0614 11/16/22 0542 11/16/22 1253  WBC 13.0* 11.2* 10.2 8.9 9.4  --   NEUTROABS 9.3*  --   --   --   --   --   HGB 12.3 11.6* 10.5* 10.5* 8.3* 8.9*  HCT 38.5 34.5* 32.3* 32.5* 25.5* 27.2*  MCV 88.7 87.1 88.5 88.8 89.5  --   PLT 194 179 203 205 183  --    Basic Metabolic Panel: Recent Labs  Lab 11/12/22 1558 11/13/22 0158 11/14/22 0418 11/15/22 0614 11/16/22 0542  NA 136 137 138 141 138  K 3.3* 3.3* 4.0 3.8 3.7  CL 102 106 106 109 107  CO2 25 22 23 24 24   GLUCOSE 124* 119* 131* 135* 146*  BUN 22* 21* 18 15 16   CREATININE 1.33* 1.22* 1.38* 1.30* 1.21*  CALCIUM 9.1 8.4* 8.3* 8.6* 8.6*   MG  --   --  1.6* 2.0  --    Liver Function Tests: Recent Labs  Lab 11/12/22 1842 11/13/22 0158  AST 16 14*  ALT 23 22  ALKPHOS 40 38  BILITOT 0.5 0.6  PROT 6.7 6.5  ALBUMIN 3.8 3.7   CBG: Recent Labs  Lab 11/15/22 1124 11/15/22  1748 11/15/22 2040 11/16/22 0915 11/16/22 1150  GLUCAP 124* 169* 158* 205* 152*    Discharge time spent: {LESS THAN/GREATER THAN:26388} 30 minutes.  Signed: Pennie Banter, DO [facility] 11/16/2022

## 2022-11-18 ENCOUNTER — Encounter: Payer: Self-pay | Admitting: Vascular Surgery

## 2022-11-19 ENCOUNTER — Encounter: Payer: Self-pay | Admitting: Internal Medicine

## 2022-12-03 ENCOUNTER — Ambulatory Visit (INDEPENDENT_AMBULATORY_CARE_PROVIDER_SITE_OTHER): Payer: BC Managed Care – PPO | Admitting: Vascular Surgery

## 2022-12-03 VITALS — BP 106/69 | HR 85 | Resp 18 | Ht 62.0 in | Wt 188.6 lb

## 2022-12-03 DIAGNOSIS — Z95828 Presence of other vascular implants and grafts: Secondary | ICD-10-CM

## 2022-12-03 DIAGNOSIS — I2699 Other pulmonary embolism without acute cor pulmonale: Secondary | ICD-10-CM | POA: Diagnosis not present

## 2022-12-03 DIAGNOSIS — I10 Essential (primary) hypertension: Secondary | ICD-10-CM

## 2022-12-03 DIAGNOSIS — I824Y2 Acute embolism and thrombosis of unspecified deep veins of left proximal lower extremity: Secondary | ICD-10-CM | POA: Diagnosis not present

## 2022-12-03 DIAGNOSIS — N1831 Chronic kidney disease, stage 3a: Secondary | ICD-10-CM

## 2022-12-03 NOTE — Assessment & Plan Note (Signed)
Status post thrombectomy for extensive left lower extremity DVT.  Does also have a filter in place that was placed at the time of her pulmonary thrombectomy.  We would consider removal in a month or so.  Repeat ultrasound of the lower extremities prior to removal.  Continue anticoagulation.  Compression socks and elevation.

## 2022-12-03 NOTE — Assessment & Plan Note (Signed)
Status post pulmonary thrombectomy.  On anticoagulation.  Would recommend anticoagulation for at least 1 year.

## 2022-12-03 NOTE — Assessment & Plan Note (Signed)
 blood pressure control important in reducing the progression of atherosclerotic disease. On appropriate oral medications.  

## 2022-12-03 NOTE — Assessment & Plan Note (Signed)
Has an IVC filter in place.  We can look to remove that in a month or so if she does not have any further DVT on duplex.

## 2022-12-03 NOTE — Progress Notes (Signed)
MRN : [id number]  Cynthia Mccarthy is a 52 y.o. (03/11/1971) female who presents with chief complaint of  Chief Complaint  Patient presents with   Venous Insufficiency  .  History of Present Illness: Patient returns today in follow up of DVT and pulmonary embolus.  About 3 weeks ago, she underwent pulmonary thrombectomy with IVC filter placement and then 2 days later a left lower extremity DVT thrombectomy.  She is doing quite well.  She asks about going back to work next week on her visit today.  She does very strenuous labor at work.  She says her breathing is very near normal at this point.  She still feels a little fatigued and does not have perfect energy but she has not been doing much since her event.  In terms of her legs, she is not really having any significant swelling or pain.  She is tolerating Eliquis without any major issues.  No signs of bleeding.  Current Outpatient Medications  Medication Sig Dispense Refill   APIXABAN (ELIQUIS) VTE STARTER PACK (10MG  AND 5MG ) Take as directed on package: start with two-5mg  tablets twice daily for 7 days. On day 8, switch to one-5mg  tablet twice daily. 74 each 0   busPIRone (BUSPAR) 10 MG tablet Take 10 mg by mouth 2 (two) times daily.     cariprazine (VRAYLAR) 1.5 MG capsule Take 1.5 mg by mouth daily.     Catheters (INTERMITTENT 14FR/40CM) MISC Use for urinary self-catheterization 3 times a day as directed     citalopram (CELEXA) 40 MG tablet Take 40 mg by mouth daily.     famotidine (PEPCID) 40 MG tablet Take 40 mg by mouth daily.     fluticasone (FLONASE) 50 MCG/ACT nasal spray Place 2 sprays into both nostrils daily.      levothyroxine (SYNTHROID) 100 MCG tablet Take 100 mcg by mouth daily before breakfast.     metFORMIN (GLUCOPHAGE) 500 MG tablet Take 500 mg by mouth daily.     mupirocin ointment (BACTROBAN) 2 % Apply 1 Application topically. 1 Application Topical 2-3 Times Daily     rosuvastatin (CRESTOR) 5 MG tablet Take 5 mg by  mouth daily.     SYMBICORT 160-4.5 MCG/ACT inhaler Inhale into the lungs.     traZODone (DESYREL) 100 MG tablet Take 100 mg by mouth at bedtime. Take 100 mg by mouth nightly Take 2 tabs if needed     vitamin B-12 (CYANOCOBALAMIN) 1000 MCG tablet Take by mouth.     No current facility-administered medications for this visit.    Past Medical History:  Diagnosis Date   Anxiety    Cervical cancer (HCC)    Depression    Diabetes mellitus without complication (HCC)    DVT (deep venous thrombosis) (HCC)    Hormone disorder    Hypertension    Hypokalemia 11/13/2022   Hypomagnesemia 11/14/2022   Hypothyroidism     Past Surgical History:  Procedure Laterality Date   ABDOMINAL HYSTERECTOMY     IVC FILTER INSERTION N/A 11/13/2022   Procedure: IVC FILTER INSERTION;  Surgeon: Annice Needy, MD;  Location: [facility];  Service: Cardiovascular;  Laterality: N/A;   PERIPHERAL VASCULAR THROMBECTOMY Left 11/15/2022   Procedure: PERIPHERAL VASCULAR THROMBECTOMY;  Surgeon: Annice Needy, MD;  Location: [facility];  Service: Cardiovascular;  Laterality: Left;   PULMONARY THROMBECTOMY Bilateral 11/13/2022   Procedure: PULMONARY THROMBECTOMY;  Surgeon: Annice Needy, MD;  Location: [facility];  Service: Cardiovascular;  Laterality: Bilateral;     Social History   Tobacco Use   Smoking status: Never   Smokeless tobacco: Never  Vaping Use   Vaping status: Never Used  Substance Use Topics   Alcohol use: Yes   Drug use: No       Family History  Problem Relation Age of Onset   Bladder Cancer Neg Hx    Kidney cancer Neg Hx   No bleeding or clotting disorders  Allergies  Allergen Reactions   Penicillins Rash    Has patient had a PCN reaction causing immediate rash, facial/tongue/throat swelling, SOB or lightheadedness with hypotension: Yes Has patient had a PCN reaction causing severe rash involving mucus membranes or skin necrosis: No Has patient had a PCN  reaction that required hospitalization: No Has patient had a PCN reaction occurring within the last 10 years: Yes If all of the above answers are "NO", then may proceed with Cephalosporin use.   Melatonin     Other reaction(s): Other (See Comments), Other (See Comments) Pt. States have nightmares Pt. States have nightmares    Sulfa Antibiotics Hives and Rash     REVIEW OF SYSTEMS (Negative unless checked)  Constitutional: [] Weight loss  [] Fever  [] Chills Cardiac: [] Chest pain   [] Chest pressure   [] Palpitations   [] Shortness of breath when laying flat   [] Shortness of breath at rest   [x] Shortness of breath with exertion. Vascular:  [] Pain in legs with walking   [] Pain in legs at rest   [] Pain in legs when laying flat   [] Claudication   [] Pain in feet when walking  [] Pain in feet at rest  [] Pain in feet when laying flat   [x] History of DVT   [x] Phlebitis   [] Swelling in legs   [] Varicose veins   [] Non-healing ulcers Pulmonary:   [] Uses home oxygen   [] Productive cough   [] Hemoptysis   [] Wheeze  [] COPD   [] Asthma Neurologic:  [] Dizziness  [] Blackouts   [] Seizures   [] History of stroke   [] History of TIA  [] Aphasia   [] Temporary blindness   [] Dysphagia   [] Weakness or numbness in arms   [] Weakness or numbness in legs Musculoskeletal:  [] Arthritis   [] Joint swelling   [x] Joint pain   [] Low back pain Hematologic:  [] Easy bruising  [] Easy bleeding   [] Hypercoagulable state   [] Anemic   Gastrointestinal:  [] Blood in stool   [] Vomiting blood  [] Gastroesophageal reflux/heartburn   [] Abdominal pain Genitourinary:  [] Chronic kidney disease   [] Difficult urination  [] Frequent urination  [] Burning with urination   [] Hematuria Skin:  [] Rashes   [] Ulcers   [] Wounds Psychological:  [] History of anxiety   []  History of major depression.  Physical Examination  BP 106/69 (BP Location: Right Arm)   Pulse 85   Resp 18   Ht 5\' 2"  (1.575 m)   Wt 188 lb 9.6 oz (85.5 kg)   BMI 34.50 kg/m  Gen:  WD/WN,  NAD Head: [place]/AT, No temporalis wasting. Ear/Nose/Throat: Hearing grossly intact, nares w/o erythema or drainage Eyes: Conjunctiva clear. Sclera non-icteric Neck: Supple.  Trachea midline Pulmonary:  Good air movement, no use of accessory muscles.  Cardiac: RRR, no JVD Vascular:  Vessel Right Left  Radial Palpable Palpable               Musculoskeletal: M/S 5/5 throughout.  No deformity or atrophy. No appreciable edema. Neurologic: Sensation grossly intact in extremities.  Symmetrical.  Speech is fluent.  Psychiatric: Judgment intact, Mood & affect  appropriate for pt's clinical situation. Dermatologic: No rashes or ulcers noted.  No cellulitis or open wounds.      Labs Recent Results (from the past 2160 hour(s))  CBC with Differential     Status: Abnormal   Collection Time: 11/12/22  3:58 PM  Result Value Ref Range   WBC 13.0 (H) 4.0 - 10.5 K/uL   RBC 4.34 3.87 - 5.11 MIL/uL   Hemoglobin 12.3 12.0 - 15.0 g/dL   HCT 95.1 88.4 - 16.6 %   MCV 88.7 80.0 - 100.0 fL   MCH 28.3 26.0 - 34.0 pg   MCHC 31.9 30.0 - 36.0 g/dL   RDW 06.3 01.6 - 01.0 %   Platelets 194 150 - 400 K/uL   nRBC 0.0 0.0 - 0.2 %   Neutrophils Relative % 72 %   Neutro Abs 9.3 (H) 1.7 - 7.7 K/uL   Lymphocytes Relative 17 %   Lymphs Abs 2.3 0.7 - 4.0 K/uL   Monocytes Relative 7 %   Monocytes Absolute 0.9 0.1 - 1.0 K/uL   Eosinophils Relative 3 %   Eosinophils Absolute 0.4 0.0 - 0.5 K/uL   Basophils Relative 1 %   Basophils Absolute 0.1 0.0 - 0.1 K/uL   Immature Granulocytes 0 %   Abs Immature Granulocytes 0.05 0.00 - 0.07 K/uL    Comment: Performed at [facility], [address] [place], [place] [address]  Basic metabolic panel     Status: Abnormal   Collection Time: 11/12/22  3:58 PM  Result Value Ref Range   Sodium 136 135 - 145 mmol/L   Potassium 3.3 (L) 3.5 - 5.1 mmol/L   Chloride 102 98 - 111 mmol/L   CO2 25 22 - 32 mmol/L   Glucose, Bld 124 (H) 70 - 99 mg/dL    Comment: Glucose  reference range applies only to samples taken after fasting for at least 8 hours.   BUN 22 (H) 6 - 20 mg/dL   Creatinine, Ser 5.73 (H) 0.44 - 1.00 mg/dL   Calcium 9.1 8.9 - 22.0 mg/dL   GFR, Estimated 48 (L) >60 mL/min    Comment: (NOTE) Calculated using the CKD-EPI Creatinine Equation (2021)    Anion gap 9 5 - 15    Comment: Performed at [facility], [address] [place], [place] [address]  Troponin I (High Sensitivity)     Status: None   Collection Time: 11/12/22  3:58 PM  Result Value Ref Range   Troponin I (High Sensitivity) 5 <18 ng/L    Comment: (NOTE) Elevated high sensitivity troponin I (hsTnI) values and significant  changes across serial measurements may suggest ACS but many other  chronic and acute conditions are known to elevate hsTnI results.  Refer to the "Links" section for chest pain algorithms and additional  guidance. Performed at [facility], [address] [place], [place] [address]   Troponin I (High Sensitivity)     Status: None   Collection Time: 11/12/22  6:42 PM  Result Value Ref Range   Troponin I (High Sensitivity) 4 <18 ng/L    Comment: (NOTE) Elevated high sensitivity troponin I (hsTnI) values and significant  changes across serial measurements may suggest ACS but many other  chronic and acute conditions are known to elevate hsTnI results.  Refer to the "Links" section for chest pain algorithms and additional  guidance. Performed at [facility], [address], [place], [place] [address]   Protime-INR     Status:  None   Collection Time: 11/12/22  6:42 PM  Result Value Ref Range   Prothrombin Time 14.9 11.4 - 15.2 seconds   INR 1.2 0.8 - 1.2    Comment: (NOTE) INR goal varies based on device and disease states. Performed at [facility], [address] [place], [place] [address]   APTT     Status: None   Collection Time: 11/12/22  6:42 PM  Result Value Ref Range   aPTT 27 24 - 36 seconds     Comment: Performed at [facility], [address] [place], [place] [address]  Brain natriuretic peptide     Status: None   Collection Time: 11/12/22  6:42 PM  Result Value Ref Range   B Natriuretic Peptide 18.2 0.0 - 100.0 pg/mL    Comment: Performed at [facility], [address] [place], [place] [address]  Hepatic function panel     Status: None   Collection Time: 11/12/22  6:42 PM  Result Value Ref Range   Total Protein 6.7 6.5 - 8.1 g/dL   Albumin 3.8 3.5 - 5.0 g/dL   AST 16 15 - 41 U/L   ALT 23 0 - 44 U/L   Alkaline Phosphatase 40 38 - 126 U/L   Total Bilirubin 0.5 0.3 - 1.2 mg/dL   Bilirubin, Direct 0.2 0.0 - 0.2 mg/dL   Indirect Bilirubin 0.3 0.3 - 0.9 mg/dL    Comment: Performed at [facility], [address] [place], [place] [address]  Glucose, capillary     Status: Abnormal   Collection Time: 11/12/22  8:00 PM  Result Value Ref Range   Glucose-Capillary 111 (H) 70 - 99 mg/dL    Comment: Glucose reference range applies only to samples taken after fasting for at least 8 hours.  MRSA Next Gen by PCR, Nasal     Status: None   Collection Time: 11/12/22  8:12 PM   Specimen: Nasal Mucosa; Nasal Swab  Result Value Ref Range   MRSA by PCR Next Gen NOT DETECTED NOT DETECTED    Comment: (NOTE) The GeneXpert MRSA Assay (FDA approved for NASAL specimens only), is one component of a comprehensive MRSA colonization surveillance program. It is not intended to diagnose MRSA infection nor to guide or monitor treatment for MRSA infections. Test performance is not FDA approved in patients less than 53 years old. Performed at [facility], [address] [place], [place] [address]   Hemoglobin A1c     Status: Abnormal   Collection Time: 11/12/22  8:33 PM  Result Value Ref Range   Hgb A1c MFr Bld 7.3 (H) 4.8 - 5.6 %    Comment: (NOTE)         Prediabetes: 5.7 - 6.4         Diabetes: >6.4         Glycemic control for adults with diabetes:  <7.0    Mean Plasma Glucose 163 mg/dL    Comment: (NOTE) Performed At: [facility] [address] [place], [place] [id number] Jolene Schimke MD ON:[id number]   Glucose, capillary     Status: Abnormal   Collection Time: 11/12/22 11:28 PM  Result Value Ref Range   Glucose-Capillary 100 (H) 70 - 99 mg/dL    Comment: Glucose reference range applies only to samples taken after fasting for at least 8 hours.  Heparin level (unfractionated)     Status: Abnormal   Collection Time: 11/13/22  1:58 AM  Result Value Ref Range  Heparin Unfractionated 1.08 (H) 0.30 - 0.70 IU/mL    Comment: (NOTE) The clinical reportable range upper limit is being lowered to >1.10 to align with the FDA approved guidance for the current laboratory assay.  If heparin results are below expected values, and patient dosage has  been confirmed, suggest follow up testing of antithrombin III levels. Performed at [facility], [address] [place], [place] [address]   CBC     Status: Abnormal   Collection Time: 11/13/22  1:58 AM  Result Value Ref Range   WBC 11.2 (H) 4.0 - 10.5 K/uL   RBC 3.96 3.87 - 5.11 MIL/uL   Hemoglobin 11.6 (L) 12.0 - 15.0 g/dL   HCT 21.3 (L) 08.6 - 57.8 %   MCV 87.1 80.0 - 100.0 fL   MCH 29.3 26.0 - 34.0 pg   MCHC 33.6 30.0 - 36.0 g/dL   RDW 46.9 62.9 - 52.8 %   Platelets 179 150 - 400 K/uL   nRBC 0.0 0.0 - 0.2 %    Comment: Performed at [facility], [address] [place], [place] [address]  Comprehensive metabolic panel     Status: Abnormal   Collection Time: 11/13/22  1:58 AM  Result Value Ref Range   Sodium 137 135 - 145 mmol/L   Potassium 3.3 (L) 3.5 - 5.1 mmol/L   Chloride 106 98 - 111 mmol/L   CO2 22 22 - 32 mmol/L   Glucose, Bld 119 (H) 70 - 99 mg/dL    Comment: Glucose reference range applies only to samples taken after fasting for at least 8 hours.   BUN 21 (H) 6 - 20 mg/dL   Creatinine, Ser 4.01 (H) 0.44 - 1.00 mg/dL   Calcium 8.4 (L)  8.9 - 10.3 mg/dL   Total Protein 6.5 6.5 - 8.1 g/dL   Albumin 3.7 3.5 - 5.0 g/dL   AST 14 (L) 15 - 41 U/L   ALT 22 0 - 44 U/L   Alkaline Phosphatase 38 38 - 126 U/L   Total Bilirubin 0.6 0.3 - 1.2 mg/dL   GFR, Estimated 53 (L) >60 mL/min    Comment: (NOTE) Calculated using the CKD-EPI Creatinine Equation (2021)    Anion gap 9 5 - 15    Comment: Performed at [facility], [address] [place], [place] [address]  HIV Antibody (routine testing w rflx)     Status: None   Collection Time: 11/13/22  1:58 AM  Result Value Ref Range   HIV Screen 4th Generation wRfx Non Reactive Non Reactive    Comment: Performed at [facility], [address] [place], [place] [address]  ABO/Rh     Status: None   Collection Time: 11/13/22  1:58 AM  Result Value Ref Range   ABO/RH(D)      A NEG Performed at [facility], [address] [place], [place] [address]   Glucose, capillary     Status: Abnormal   Collection Time: 11/13/22  3:51 AM  Result Value Ref Range   Glucose-Capillary 113 (H) 70 - 99 mg/dL    Comment: Glucose reference range applies only to samples taken after fasting for at least 8 hours.  Glucose, capillary     Status: Abnormal   Collection Time: 11/13/22  7:45 AM  Result Value Ref Range   Glucose-Capillary 117 (H) 70 - 99 mg/dL    Comment: Glucose reference range applies only to samples taken after fasting for at least 8  hours.  Heparin level (unfractionated)     Status: None   Collection Time: 11/13/22 10:08 AM  Result Value Ref Range   Heparin Unfractionated 0.52 0.30 - 0.70 IU/mL    Comment: (NOTE) The clinical reportable range upper limit is being lowered to >1.10 to align with the FDA approved guidance for the current laboratory assay.  If heparin results are below expected values, and patient dosage has  been confirmed, suggest follow up testing of antithrombin III levels. Performed at [facility], [address]  [place], [place] [address]   Type and screen [facility]     Status: None   Collection Time: 11/13/22 10:08 AM  Result Value Ref Range   ABO/RH(D) A NEG    Antibody Screen NEG    Sample Expiration      11/16/2022,2359 Performed at [facility], [address] [place], [place] [address]   Glucose, capillary     Status: Abnormal   Collection Time: 11/13/22 10:50 AM  Result Value Ref Range   Glucose-Capillary 111 (H) 70 - 99 mg/dL    Comment: Glucose reference range applies only to samples taken after fasting for at least 8 hours.  Glucose, capillary     Status: Abnormal   Collection Time: 11/13/22  2:46 PM  Result Value Ref Range   Glucose-Capillary 110 (H) 70 - 99 mg/dL    Comment: Glucose reference range applies only to samples taken after fasting for at least 8 hours.  Glucose, capillary     Status: Abnormal   Collection Time: 11/13/22  4:21 PM  Result Value Ref Range   Glucose-Capillary 134 (H) 70 - 99 mg/dL    Comment: Glucose reference range applies only to samples taken after fasting for at least 8 hours.  Glucose, capillary     Status: Abnormal   Collection Time: 11/13/22  7:20 PM  Result Value Ref Range   Glucose-Capillary 110 (H) 70 - 99 mg/dL    Comment: Glucose reference range applies only to samples taken after fasting for at least 8 hours.  Heparin level (unfractionated)     Status: None   Collection Time: 11/13/22  7:45 PM  Result Value Ref Range   Heparin Unfractionated 0.50 0.30 - 0.70 IU/mL    Comment: (NOTE) The clinical reportable range upper limit is being lowered to >1.10 to align with the FDA approved guidance for the current laboratory assay.  If heparin results are below expected values, and patient dosage has  been confirmed, suggest follow up testing of antithrombin III levels. Performed at [facility], [address] [place], [place] [address]   Glucose, capillary     Status: Abnormal   Collection Time:  11/13/22  9:15 PM  Result Value Ref Range   Glucose-Capillary 148 (H) 70 - 99 mg/dL    Comment: Glucose reference range applies only to samples taken after fasting for at least 8 hours.  CBC     Status: Abnormal   Collection Time: 11/14/22  4:18 AM  Result Value Ref Range   WBC 10.2 4.0 - 10.5 K/uL   RBC 3.65 (L) 3.87 - 5.11 MIL/uL   Hemoglobin 10.5 (L) 12.0 - 15.0 g/dL   HCT 13.2 (L) 44.0 - 10.2 %   MCV 88.5 80.0 - 100.0 fL   MCH 28.8 26.0 - 34.0 pg   MCHC 32.5 30.0 - 36.0 g/dL   RDW 72.5 36.6 - 44.0 %   Platelets 203 150 - 400 K/uL  nRBC 0.0 0.0 - 0.2 %    Comment: Performed at [facility], [address] [place], [place] [address]  Basic metabolic panel     Status: Abnormal   Collection Time: 11/14/22  4:18 AM  Result Value Ref Range   Sodium 138 135 - 145 mmol/L   Potassium 4.0 3.5 - 5.1 mmol/L   Chloride 106 98 - 111 mmol/L   CO2 23 22 - 32 mmol/L   Glucose, Bld 131 (H) 70 - 99 mg/dL    Comment: Glucose reference range applies only to samples taken after fasting for at least 8 hours.   BUN 18 6 - 20 mg/dL   Creatinine, Ser 1.91 (H) 0.44 - 1.00 mg/dL   Calcium 8.3 (L) 8.9 - 10.3 mg/dL   GFR, Estimated 46 (L) >60 mL/min    Comment: (NOTE) Calculated using the CKD-EPI Creatinine Equation (2021)    Anion gap 9 5 - 15    Comment: Performed at [facility], [address] [place], [place] [address]  Magnesium     Status: Abnormal   Collection Time: 11/14/22  4:18 AM  Result Value Ref Range   Magnesium 1.6 (L) 1.7 - 2.4 mg/dL    Comment: Performed at [facility], [address] [place], [place] [address]  Heparin level (unfractionated)     Status: None   Collection Time: 11/14/22  4:18 AM  Result Value Ref Range   Heparin Unfractionated 0.48 0.30 - 0.70 IU/mL    Comment: (NOTE) The clinical reportable range upper limit is being lowered to >1.10 to align with the FDA approved guidance for the current laboratory assay.  If heparin  results are below expected values, and patient dosage has  been confirmed, suggest follow up testing of antithrombin III levels. Performed at [facility], [address] [place], [place] [address]   Glucose, capillary     Status: Abnormal   Collection Time: 11/14/22  7:31 AM  Result Value Ref Range   Glucose-Capillary 127 (H) 70 - 99 mg/dL    Comment: Glucose reference range applies only to samples taken after fasting for at least 8 hours.  ECHOCARDIOGRAM COMPLETE BUBBLE STUDY     Status: None   Collection Time: 11/14/22 10:29 AM  Result Value Ref Range   Ao pk vel 1.64 m/s   AV Area VTI 2.27 cm2   AR max vel 2.25 cm2   AV Mean grad 5.0 mmHg   AV Peak grad 10.8 mmHg   S' Lateral 2.60 cm   AV Area mean vel 2.07 cm2   Area-P 1/2 2.56 cm2   MV VTI 2.57 cm2   Est EF 60 - 65%   Glucose, capillary     Status: Abnormal   Collection Time: 11/14/22 11:22 AM  Result Value Ref Range   Glucose-Capillary 169 (H) 70 - 99 mg/dL    Comment: Glucose reference range applies only to samples taken after fasting for at least 8 hours.  Glucose, capillary     Status: Abnormal   Collection Time: 11/14/22  3:53 PM  Result Value Ref Range   Glucose-Capillary 132 (H) 70 - 99 mg/dL    Comment: Glucose reference range applies only to samples taken after fasting for at least 8 hours.  Glucose, capillary     Status: Abnormal   Collection Time: 11/14/22  9:05 PM  Result Value Ref Range   Glucose-Capillary 126 (H) 70 - 99 mg/dL    Comment: Glucose reference range  applies only to samples taken after fasting for at least 8 hours.  Heparin level (unfractionated)     Status: None   Collection Time: 11/15/22  6:14 AM  Result Value Ref Range   Heparin Unfractionated 0.48 0.30 - 0.70 IU/mL    Comment: (NOTE) The clinical reportable range upper limit is being lowered to >1.10 to align with the FDA approved guidance for the current laboratory assay.  If heparin results are below expected values, and  patient dosage has  been confirmed, suggest follow up testing of antithrombin III levels. Performed at [facility], [address] [place], [place] [address]   CBC     Status: Abnormal   Collection Time: 11/15/22  6:14 AM  Result Value Ref Range   WBC 8.9 4.0 - 10.5 K/uL   RBC 3.66 (L) 3.87 - 5.11 MIL/uL   Hemoglobin 10.5 (L) 12.0 - 15.0 g/dL   HCT 36.6 (L) 44.0 - 34.7 %   MCV 88.8 80.0 - 100.0 fL   MCH 28.7 26.0 - 34.0 pg   MCHC 32.3 30.0 - 36.0 g/dL   RDW 42.5 95.6 - 38.7 %   Platelets 205 150 - 400 K/uL   nRBC 0.0 0.0 - 0.2 %    Comment: Performed at [facility], [address], [place], [place] [address]  Basic metabolic panel     Status: Abnormal   Collection Time: 11/15/22  6:14 AM  Result Value Ref Range   Sodium 141 135 - 145 mmol/L   Potassium 3.8 3.5 - 5.1 mmol/L   Chloride 109 98 - 111 mmol/L   CO2 24 22 - 32 mmol/L   Glucose, Bld 135 (H) 70 - 99 mg/dL    Comment: Glucose reference range applies only to samples taken after fasting for at least 8 hours.   BUN 15 6 - 20 mg/dL   Creatinine, Ser 2.95 (H) 0.44 - 1.00 mg/dL   Calcium 8.6 (L) 8.9 - 10.3 mg/dL   GFR, Estimated 49 (L) >60 mL/min    Comment: (NOTE) Calculated using the CKD-EPI Creatinine Equation (2021)    Anion gap 8 5 - 15    Comment: Performed at [facility], [address] [place], [place] [address]  Magnesium     Status: None   Collection Time: 11/15/22  6:14 AM  Result Value Ref Range   Magnesium 2.0 1.7 - 2.4 mg/dL    Comment: Performed at [facility], [address] [place], [place] [address]  Glucose, capillary     Status: Abnormal   Collection Time: 11/15/22  8:18 AM  Result Value Ref Range   Glucose-Capillary 139 (H) 70 - 99 mg/dL    Comment: Glucose reference range applies only to samples taken after fasting for at least 8 hours.  Glucose, capillary     Status: Abnormal   Collection Time: 11/15/22 11:24 AM  Result Value Ref Range    Glucose-Capillary 124 (H) 70 - 99 mg/dL    Comment: Glucose reference range applies only to samples taken after fasting for at least 8 hours.  Glucose, capillary     Status: Abnormal   Collection Time: 11/15/22  5:48 PM  Result Value Ref Range   Glucose-Capillary 169 (H) 70 - 99 mg/dL    Comment: Glucose reference range applies only to samples taken after fasting for at least 8 hours.  Glucose, capillary     Status: Abnormal   Collection Time: 11/15/22  8:40 PM  Result Value  Ref Range   Glucose-Capillary 158 (H) 70 - 99 mg/dL    Comment: Glucose reference range applies only to samples taken after fasting for at least 8 hours.  Heparin level (unfractionated)     Status: Abnormal   Collection Time: 11/16/22  5:42 AM  Result Value Ref Range   Heparin Unfractionated >1.10 (H) 0.30 - 0.70 IU/mL    Comment: (NOTE) The clinical reportable range upper limit is being lowered to >1.10 to align with the FDA approved guidance for the current laboratory assay.  If heparin results are below expected values, and patient dosage has  been confirmed, suggest follow up testing of antithrombin III levels. Performed at [facility], [address] [place], [place] [address]   Basic metabolic panel     Status: Abnormal   Collection Time: 11/16/22  5:42 AM  Result Value Ref Range   Sodium 138 135 - 145 mmol/L   Potassium 3.7 3.5 - 5.1 mmol/L   Chloride 107 98 - 111 mmol/L   CO2 24 22 - 32 mmol/L   Glucose, Bld 146 (H) 70 - 99 mg/dL    Comment: Glucose reference range applies only to samples taken after fasting for at least 8 hours.   BUN 16 6 - 20 mg/dL   Creatinine, Ser 7.25 (H) 0.44 - 1.00 mg/dL   Calcium 8.6 (L) 8.9 - 10.3 mg/dL   GFR, Estimated 54 (L) >60 mL/min    Comment: (NOTE) Calculated using the CKD-EPI Creatinine Equation (2021)    Anion gap 7 5 - 15    Comment: Performed at [facility], [address] [place], [place] [address]  CBC     Status: Abnormal    Collection Time: 11/16/22  5:42 AM  Result Value Ref Range   WBC 9.4 4.0 - 10.5 K/uL   RBC 2.85 (L) 3.87 - 5.11 MIL/uL   Hemoglobin 8.3 (L) 12.0 - 15.0 g/dL   HCT 03.4 (L) 74.2 - 59.5 %   MCV 89.5 80.0 - 100.0 fL   MCH 29.1 26.0 - 34.0 pg   MCHC 32.5 30.0 - 36.0 g/dL   RDW 63.8 75.6 - 43.3 %   Platelets 183 150 - 400 K/uL   nRBC 0.0 0.0 - 0.2 %    Comment: Performed at [facility], [address] [place], [place] [address]  Glucose, capillary     Status: Abnormal   Collection Time: 11/16/22  9:15 AM  Result Value Ref Range   Glucose-Capillary 205 (H) 70 - 99 mg/dL    Comment: Glucose reference range applies only to samples taken after fasting for at least 8 hours.  Glucose, capillary     Status: Abnormal   Collection Time: 11/16/22 11:50 AM  Result Value Ref Range   Glucose-Capillary 152 (H) 70 - 99 mg/dL    Comment: Glucose reference range applies only to samples taken after fasting for at least 8 hours.  Hemoglobin and hematocrit, blood     Status: Abnormal   Collection Time: 11/16/22 12:53 PM  Result Value Ref Range   Hemoglobin 8.9 (L) 12.0 - 15.0 g/dL   HCT 84.1 (L) 66.0 - 63.0 %    Comment: Performed at [facility], [address], [place], [place] [address]    Radiology PERIPHERAL VASCULAR CATHETERIZATION  Result Date: 11/15/2022 See surgical note for result.  ECHOCARDIOGRAM COMPLETE BUBBLE STUDY  Result Date: 11/14/2022    ECHOCARDIOGRAM REPORT   Patient Name:   Cynthia Mccarthy Date  of Exam: 11/14/2022 Medical Rec #:  [id number]        Height:       62.0 in Accession #:    [id number]       Weight:       195.8 lb Date of Birth:  01-10-1971         BSA:          1.894 m Patient Age:    51 years         BP:           100/65 mmHg Patient Gender: F                HR:           72 bpm. Exam Location:  [facility] Procedure: 2D Echo, Cardiac Doppler, Color Doppler and Saline Contrast Bubble            Study Indications:     Pulmonary embolus  History:          Patient has no prior history of Echocardiogram examinations.                  Risk Factors:Hypertension and Diabetes. Pulm embolus.  Sonographer:     Mikki Harbor Referring Phys:  [id number] Marlow Baars [facility] Diagnosing Phys: Lorine Bears MD IMPRESSIONS  1. Left ventricular ejection fraction, by estimation, is 60 to 65%. The left ventricle has normal function. The left ventricle has no regional wall motion abnormalities. Left ventricular diastolic parameters were normal.  2. Right ventricular systolic function is normal. The right ventricular size is normal. Tricuspid regurgitation signal is inadequate for assessing PA pressure.  3. The mitral valve is normal in structure. No evidence of mitral valve regurgitation. No evidence of mitral stenosis.  4. The aortic valve is normal in structure. Aortic valve regurgitation is not visualized. No aortic stenosis is present.  5. The inferior vena cava is normal in size with greater than 50% respiratory variability, suggesting right atrial pressure of 3 mmHg.  6. Agitated saline contrast bubble study was negative, with no evidence of any interatrial shunt. FINDINGS  Left Ventricle: Left ventricular ejection fraction, by estimation, is 60 to 65%. The left ventricle has normal function. The left ventricle has no regional wall motion abnormalities. The left ventricular internal cavity size was normal in size. There is  no left ventricular hypertrophy. Left ventricular diastolic parameters were normal. Right Ventricle: The right ventricular size is normal. No increase in right ventricular wall thickness. Right ventricular systolic function is normal. Tricuspid regurgitation signal is inadequate for assessing PA pressure. Left Atrium: Left atrial size was normal in size. Right Atrium: Right atrial size was normal in size. Pericardium: Trivial pericardial effusion is present. The pericardial effusion is circumferential. Mitral Valve: The mitral valve is normal in structure. No evidence  of mitral valve regurgitation. No evidence of mitral valve stenosis. MV peak gradient, 3.8 mmHg. The mean mitral valve gradient is 2.0 mmHg. Tricuspid Valve: The tricuspid valve is normal in structure. Tricuspid valve regurgitation is not demonstrated. No evidence of tricuspid stenosis. Aortic Valve: The aortic valve is normal in structure. Aortic valve regurgitation is not visualized. No aortic stenosis is present. Aortic valve mean gradient measures 5.0 mmHg. Aortic valve peak gradient measures 10.8 mmHg. Aortic valve area, by VTI measures 2.27 cm. Pulmonic Valve: The pulmonic valve was normal in structure. Pulmonic valve regurgitation is not visualized. No evidence of pulmonic stenosis. Aorta: The aortic root is normal in size and structure. Venous:  The inferior vena cava is normal in size with greater than 50% respiratory variability, suggesting right atrial pressure of 3 mmHg. IAS/Shunts: No atrial level shunt detected by color flow Doppler. Agitated saline contrast was given intravenously to evaluate for intracardiac shunting. Agitated saline contrast bubble study was negative, with no evidence of any interatrial shunt.  LEFT VENTRICLE PLAX 2D LVIDd:         4.10 cm   Diastology LVIDs:         2.60 cm   LV e' medial:    13.30 cm/s LV PW:         1.00 cm   LV E/e' medial:  5.8 LV IVS:        0.90 cm   LV e' lateral:   13.10 cm/s LVOT diam:     1.90 cm   LV E/e' lateral: 5.9 LV SV:         71 LV SV Index:   37 LVOT Area:     2.84 cm  RIGHT VENTRICLE RV Basal diam:  2.90 cm RV Mid diam:    3.30 cm RV S prime:     12.80 cm/s LEFT ATRIUM             Index        RIGHT ATRIUM           Index LA diam:        2.10 cm 1.11 cm/m   RA Area:     13.40 cm LA Vol (A2C):   28.1 ml 14.83 ml/m  RA Volume:   34.70 ml  18.32 ml/m LA Vol (A4C):   25.9 ml 13.67 ml/m LA Biplane Vol: 27.9 ml 14.73 ml/m  AORTIC VALVE                     PULMONIC VALVE AV Area (Vmax):    2.25 cm      PV Vmax:       1.22 m/s AV Area (Vmean):    2.07 cm      PV Peak grad:  6.0 mmHg AV Area (VTI):     2.27 cm AV Vmax:           164.00 cm/s AV Vmean:          105.000 cm/s AV VTI:            0.312 m AV Peak Grad:      10.8 mmHg AV Mean Grad:      5.0 mmHg LVOT Vmax:         130.00 cm/s LVOT Vmean:        76.700 cm/s LVOT VTI:          0.250 m LVOT/AV VTI ratio: 0.80  AORTA Ao Root diam: 2.50 cm MITRAL VALVE MV Area (PHT): 2.56 cm    SHUNTS MV Area VTI:   2.57 cm    Systemic VTI:  0.25 m MV Peak grad:  3.8 mmHg    Systemic Diam: 1.90 cm MV Mean grad:  2.0 mmHg MV Vmax:       0.97 m/s MV Vmean:      58.4 cm/s MV Decel Time: 296 msec MV E velocity: 77.00 cm/s MV A velocity: 83.30 cm/s MV E/A ratio:  0.92 Lorine Bears MD Electronically signed by Lorine Bears MD Signature Date/Time: 11/14/2022/1:47:35 PM    Final    PERIPHERAL VASCULAR CATHETERIZATION  Result Date: 11/13/2022 See surgical note for result.  US RENAL  Result Date:  11/13/2022 CLINICAL DATA:  Hydronephrosis.  Cystectomy 2019. EXAM: RENAL / URINARY TRACT ULTRASOUND COMPLETE COMPARISON:  CT angio chest 11/12/2022 and ultrasound renal 06/04/2017 FINDINGS: Right Kidney: Renal measurements: 11.1 x 4.1 x 5.2 centimeters = volume: 124 mL. Echogenicity within normal limits. No mass or hydronephrosis visualized. Left Kidney: Renal measurements: 11.0 x 3.9 x 4.3 centimeters = volume: 9.6 mL. No hydronephrosis. Extrarenal pelvis noted. No renal mass. Bladder: Urinary bladder is surgically absent. Other: None. IMPRESSION: 1. No hydronephrosis.  LEFT extrarenal pelvis. 2. Prior cystectomy Electronically Signed   By: Norva Pavlov M.D.   On: 11/13/2022 08:47   CT Angio Chest PE W/Cm &/Or Wo Cm  Result Date: 11/12/2022 CLINICAL DATA:  Left leg pain and swelling, recent tongue surgery, history of DVT EXAM: CT ANGIOGRAPHY CHEST WITH CONTRAST TECHNIQUE: Multidetector CT imaging of the chest was performed using the standard protocol during bolus administration of intravenous contrast. Multiplanar CT  image reconstructions and MIPs were obtained to evaluate the vascular anatomy. RADIATION DOSE REDUCTION: This exam was performed according to the departmental dose-optimization program which includes automated exposure control, adjustment of the mA and/or kV according to patient size and/or use of iterative reconstruction technique. CONTRAST:  75mL OMNIPAQUE IOHEXOL 350 MG/ML SOLN COMPARISON:  CT abdomen 06/01/2017 FINDINGS: Cardiovascular: SVC patent. Heart size normal. Borderline RV dilatation, RV/LV ratio 1.06. scattered central and segmental partially occlusive PE involving all lobes. Adequate contrast opacification of the thoracic aorta with no evidence of dissection, aneurysm, or stenosis. There is classic 3-vessel brachiocephalic arch anatomy without proximal stenosis. Mediastinum/Nodes: No enlarged mediastinal, hilar, or axillary lymph nodes. Thyroid gland, trachea, and esophagus demonstrate no significant findings. Lungs/Pleura: No pleural effusion. No pneumothorax. Lungs are clear. Upper Abdomen: Mild left hydronephrosis. Musculoskeletal: No chest wall abnormality. No acute or significant osseous findings. Review of the MIP images confirms the above findings. IMPRESSION: 1. Positive for acute PE with CT evidence of right heart strain (RV/LV Ratio = 1.06) consistent with at least submassive (intermediate risk) PE. The presence of right heart strain has been associated with an increased risk of morbidity and mortality. Please refer to the "Code PE Focused" order set in EPIC. 2. Mild left hydronephrosis. Electronically Signed   By: Corlis Leak M.D.   On: 11/12/2022 17:41   US Venous Img Lower Unilateral Left  Result Date: 11/12/2022 CLINICAL DATA:  Left leg swelling EXAM: LEFT LOWER EXTREMITY VENOUS DOPPLER ULTRASOUND TECHNIQUE: Gray-scale sonography with graded compression, as well as color Doppler and duplex ultrasound were performed to evaluate the lower extremity deep venous systems from the level of  the common femoral vein and including the common femoral, femoral, profunda femoral, popliteal and calf veins including the posterior tibial, peroneal and gastrocnemius veins when visible. Spectral Doppler was utilized to evaluate flow at rest and with distal augmentation maneuvers in the common femoral, femoral and popliteal veins. COMPARISON:  None Available. FINDINGS: Contralateral Common Femoral Vein: Respiratory phasicity is normal and symmetric with the symptomatic side. No evidence of thrombus. Normal compressibility. Common Femoral Vein: Intraluminal echogenic thrombus. Vessel partially compressible. Phasic flow noted. Augmentation not performed. Saphenofemoral Junction: No evidence of thrombus. Normal compressibility and flow on color Doppler imaging. Profunda Femoral Vein: Ill-defined intraluminal thrombus. Vessel partially compressible. Phasic flow maintained. Augmentation not performed. Femoral Vein: Mixed echogenicity intraluminal thrombus. Thrombus appears occlusive. Vessel noncompressible. Popliteal Vein: Similar mixed echogenicity intraluminal thrombus. Thrombus appearing occlusive. Vessel noncompressible. Calf Veins: Limited assessment but thrombus does appear to extend into the tibial peroneal calf veins. Vessels  are noncompressible. IMPRESSION: Positive exam for extensive left femoropopliteal DVT extending into the calf veins. Electronically Signed   By: Judie Petit.  Shick M.D.   On: 11/12/2022 15:19    Assessment/Plan  Acute pulmonary embolism (HCC) Status post pulmonary thrombectomy.  On anticoagulation.  Would recommend anticoagulation for at least 1 year.  Acute deep vein thrombosis (DVT) of left lower extremity (HCC) Status post thrombectomy for extensive left lower extremity DVT.  Does also have a filter in place that was placed at the time of her pulmonary thrombectomy.  We would consider removal in a month or so.  Repeat ultrasound of the lower extremities prior to removal.  Continue  anticoagulation.  Compression socks and elevation.  Essential hypertension blood pressure control important in reducing the progression of atherosclerotic disease. On appropriate oral medications.   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  We can look to remove that in a month or so if she does not have any further DVT on duplex.  Stage 3a chronic kidney disease (HCC) Hydrate and limit contrast with procedures.    Festus Barren, MD  12/03/2022 12:01 PM    This note was created with Dragon medical transcription system.  Any errors from dictation are purely unintentional

## 2022-12-03 NOTE — Assessment & Plan Note (Signed)
Hydrate and limit contrast with procedures.

## 2022-12-05 ENCOUNTER — Telehealth (INDEPENDENT_AMBULATORY_CARE_PROVIDER_SITE_OTHER): Payer: Self-pay

## 2022-12-05 ENCOUNTER — Encounter (INDEPENDENT_AMBULATORY_CARE_PROVIDER_SITE_OTHER): Payer: Self-pay | Admitting: Vascular Surgery

## 2022-12-10 ENCOUNTER — Telehealth (INDEPENDENT_AMBULATORY_CARE_PROVIDER_SITE_OTHER): Payer: Self-pay

## 2022-12-10 NOTE — Telephone Encounter (Signed)
Error

## 2022-12-12 ENCOUNTER — Other Ambulatory Visit (INDEPENDENT_AMBULATORY_CARE_PROVIDER_SITE_OTHER): Payer: Self-pay

## 2022-12-12 MED ORDER — APIXABAN 5 MG PO TABS: 5.0000 mg | ORAL_TABLET | Freq: Two times a day (BID) | ORAL | 2 refills | Status: DC

## 2022-12-12 NOTE — Telephone Encounter (Signed)
Patient called for a medication refill. Patient started on a started pack of Eliquis after a procedure and needed a refill to continue medication afterwards. After getting an permission from Dr. Gilda Crease, I sent in Eliquis 5 mg bid to [facility] in [place] with 2 refills.

## 2023-01-07 ENCOUNTER — Ambulatory Visit (INDEPENDENT_AMBULATORY_CARE_PROVIDER_SITE_OTHER): Payer: BC Managed Care – PPO | Admitting: Vascular Surgery

## 2023-01-07 ENCOUNTER — Encounter (INDEPENDENT_AMBULATORY_CARE_PROVIDER_SITE_OTHER): Payer: Self-pay | Admitting: Vascular Surgery

## 2023-01-07 ENCOUNTER — Ambulatory Visit (INDEPENDENT_AMBULATORY_CARE_PROVIDER_SITE_OTHER): Payer: BC Managed Care – PPO

## 2023-01-07 VITALS — BP 138/85 | HR 71 | Wt 191.8 lb

## 2023-01-07 DIAGNOSIS — I2699 Other pulmonary embolism without acute cor pulmonale: Secondary | ICD-10-CM | POA: Diagnosis not present

## 2023-01-07 DIAGNOSIS — I824Y2 Acute embolism and thrombosis of unspecified deep veins of left proximal lower extremity: Secondary | ICD-10-CM | POA: Diagnosis not present

## 2023-01-07 DIAGNOSIS — N1831 Chronic kidney disease, stage 3a: Secondary | ICD-10-CM | POA: Diagnosis not present

## 2023-01-07 DIAGNOSIS — Z95828 Presence of other vascular implants and grafts: Secondary | ICD-10-CM

## 2023-01-07 DIAGNOSIS — I10 Essential (primary) hypertension: Secondary | ICD-10-CM

## 2023-01-07 NOTE — Progress Notes (Signed)
MRN : [id number]  Cynthia Mccarthy is a 52 y.o. (05/11/1971) female who presents with chief complaint of  Chief Complaint  Patient presents with   Follow-up  .  History of Present Illness: Patient returns today in follow up of her DVT and PE.  She is doing well.  She says her breathing may not be 100%, but it is good.  Her legs are not bothering her at all.  She is tolerating anticoagulation.  She is having issues with her urostomy, but not secondary to bleeding.  This was an infectious issue. She has now been on anticoagulation for approximately 2 months.  A duplex today shows resolution of the left lower extremity DVT with no DVT noted on duplex today.  Current Outpatient Medications  Medication Sig Dispense Refill   apixaban (ELIQUIS) 5 MG TABS tablet Take 1 tablet (5 mg total) by mouth 2 (two) times daily. 60 tablet 2   busPIRone (BUSPAR) 10 MG tablet Take 10 mg by mouth 2 (two) times daily.     cariprazine (VRAYLAR) 1.5 MG capsule Take 1.5 mg by mouth daily.     Catheters (INTERMITTENT 14FR/40CM) MISC Use for urinary self-catheterization 3 times a day as directed     citalopram (CELEXA) 40 MG tablet Take 40 mg by mouth daily.     famotidine (PEPCID) 40 MG tablet Take 40 mg by mouth daily.     levothyroxine (SYNTHROID) 100 MCG tablet Take 100 mcg by mouth daily before breakfast.     metFORMIN (GLUCOPHAGE) 500 MG tablet Take 500 mg by mouth daily.     rosuvastatin (CRESTOR) 5 MG tablet Take 5 mg by mouth daily.     SYMBICORT 160-4.5 MCG/ACT inhaler Inhale into the lungs.     traZODone (DESYREL) 100 MG tablet Take 100 mg by mouth at bedtime. Take 100 mg by mouth nightly Take 2 tabs if needed     vitamin B-12 (CYANOCOBALAMIN) 1000 MCG tablet Take by mouth.     No current facility-administered medications for this visit.    Past Medical History:  Diagnosis Date   Anxiety    Cervical cancer (HCC)    Depression    Diabetes mellitus without complication (HCC)    DVT (deep venous  thrombosis) (HCC)    Hormone disorder    Hypertension    Hypokalemia 11/13/2022   Hypomagnesemia 11/14/2022   Hypothyroidism     Past Surgical History:  Procedure Laterality Date   ABDOMINAL HYSTERECTOMY     IVC FILTER INSERTION N/A 11/13/2022   Procedure: IVC FILTER INSERTION;  Surgeon: Annice Needy, MD;  Location: [facility];  Service: Cardiovascular;  Laterality: N/A;   PERIPHERAL VASCULAR THROMBECTOMY Left 11/15/2022   Procedure: PERIPHERAL VASCULAR THROMBECTOMY;  Surgeon: Annice Needy, MD;  Location: [facility];  Service: Cardiovascular;  Laterality: Left;   PULMONARY THROMBECTOMY Bilateral 11/13/2022   Procedure: PULMONARY THROMBECTOMY;  Surgeon: Annice Needy, MD;  Location: [facility];  Service: Cardiovascular;  Laterality: Bilateral;     Social History   Tobacco Use   Smoking status: Never    Passive exposure: Never   Smokeless tobacco: Never  Vaping Use   Vaping status: Never Used  Substance Use Topics   Alcohol use: Yes   Drug use: No      Family History  Problem Relation Age of Onset   Bladder Cancer Neg Hx    Kidney cancer Neg Hx   No bleeding or clotting disorders  Allergies  Allergen Reactions   Penicillins Rash    Has patient had a PCN reaction causing immediate rash, facial/tongue/throat swelling, SOB or lightheadedness with hypotension: Yes Has patient had a PCN reaction causing severe rash involving mucus membranes or skin necrosis: No Has patient had a PCN reaction that required hospitalization: No Has patient had a PCN reaction occurring within the last 10 years: Yes If all of the above answers are "NO", then may proceed with Cephalosporin use.   Melatonin     Other reaction(s): Other (See Comments), Other (See Comments) Pt. States have nightmares Pt. States have nightmares    Sulfa Antibiotics Hives and Rash       REVIEW OF SYSTEMS (Negative unless checked)   Constitutional: [] Weight loss  [] Fever   [] Chills Cardiac: [] Chest pain   [] Chest pressure   [] Palpitations   [] Shortness of breath when laying flat   [] Shortness of breath at rest   [x] Shortness of breath with exertion. Vascular:  [] Pain in legs with walking   [] Pain in legs at rest   [] Pain in legs when laying flat   [] Claudication   [] Pain in feet when walking  [] Pain in feet at rest  [] Pain in feet when laying flat   [x] History of DVT   [x] Phlebitis   [] Swelling in legs   [] Varicose veins   [] Non-healing ulcers Pulmonary:   [] Uses home oxygen   [] Productive cough   [] Hemoptysis   [] Wheeze  [] COPD   [] Asthma Neurologic:  [] Dizziness  [] Blackouts   [] Seizures   [] History of stroke   [] History of TIA  [] Aphasia   [] Temporary blindness   [] Dysphagia   [] Weakness or numbness in arms   [] Weakness or numbness in legs Musculoskeletal:  [] Arthritis   [] Joint swelling   [x] Joint pain   [] Low back pain Hematologic:  [] Easy bruising  [] Easy bleeding   [] Hypercoagulable state   [] Anemic   Gastrointestinal:  [] Blood in stool   [] Vomiting blood  [] Gastroesophageal reflux/heartburn   [] Abdominal pain Genitourinary:  [] Chronic kidney disease   [] Difficult urination  [] Frequent urination  [] Burning with urination   [] Hematuria Skin:  [] Rashes   [] Ulcers   [] Wounds Psychological:  [] History of anxiety   []  History of major depression.    Physical Examination  BP 138/85 (BP Location: Right Arm, Patient Position: Sitting, Cuff Size: Large)   Pulse 71   Wt 191 lb 12.8 oz (87 kg)   BMI 35.08 kg/m  Gen:  WD/WN, NAD Head: [place]/AT, No temporalis wasting. Ear/Nose/Throat: Hearing grossly intact, nares w/o erythema or drainage Eyes: Conjunctiva clear. Sclera non-icteric Neck: Supple.  Trachea midline Pulmonary:  Good air movement, no use of accessory muscles.  Cardiac: RRR, no JVD Vascular:  Vessel Right Left  Radial Palpable Palpable               Musculoskeletal: M/S 5/5 throughout.  No deformity or atrophy. No appreciable LE edema. Neurologic:  Sensation grossly intact in extremities.  Symmetrical.  Speech is fluent.  Psychiatric: Judgment intact, Mood & affect appropriate for pt's clinical situation. Dermatologic: No rashes or ulcers noted.  No cellulitis or open wounds.      Labs Recent Results (from the past 2160 hour(s))  CBC with Differential     Status: Abnormal   Collection Time: 11/12/22  3:58 PM  Result Value Ref Range   WBC 13.0 (H) 4.0 - 10.5 K/uL   RBC 4.34 3.87 - 5.11 MIL/uL   Hemoglobin 12.3 12.0 - 15.0 g/dL   HCT 40.9  36.0 - 46.0 %   MCV 88.7 80.0 - 100.0 fL   MCH 28.3 26.0 - 34.0 pg   MCHC 31.9 30.0 - 36.0 g/dL   RDW 64.3 32.9 - 51.8 %   Platelets 194 150 - 400 K/uL   nRBC 0.0 0.0 - 0.2 %   Neutrophils Relative % 72 %   Neutro Abs 9.3 (H) 1.7 - 7.7 K/uL   Lymphocytes Relative 17 %   Lymphs Abs 2.3 0.7 - 4.0 K/uL   Monocytes Relative 7 %   Monocytes Absolute 0.9 0.1 - 1.0 K/uL   Eosinophils Relative 3 %   Eosinophils Absolute 0.4 0.0 - 0.5 K/uL   Basophils Relative 1 %   Basophils Absolute 0.1 0.0 - 0.1 K/uL   Immature Granulocytes 0 %   Abs Immature Granulocytes 0.05 0.00 - 0.07 K/uL    Comment: Performed at [facility], [address] [place], [place] [address]  Basic metabolic panel     Status: Abnormal   Collection Time: 11/12/22  3:58 PM  Result Value Ref Range   Sodium 136 135 - 145 mmol/L   Potassium 3.3 (L) 3.5 - 5.1 mmol/L   Chloride 102 98 - 111 mmol/L   CO2 25 22 - 32 mmol/L   Glucose, Bld 124 (H) 70 - 99 mg/dL    Comment: Glucose reference range applies only to samples taken after fasting for at least 8 hours.   BUN 22 (H) 6 - 20 mg/dL   Creatinine, Ser 0.63 (H) 0.44 - 1.00 mg/dL   Calcium 9.1 8.9 - 01.6 mg/dL   GFR, Estimated 48 (L) >60 mL/min    Comment: (NOTE) Calculated using the CKD-EPI Creatinine Equation (2021)    Anion gap 9 5 - 15    Comment: Performed at [facility], [address] [place], [place] [address]  Troponin I (High Sensitivity)      Status: None   Collection Time: 11/12/22  3:58 PM  Result Value Ref Range   Troponin I (High Sensitivity) 5 <18 ng/L    Comment: (NOTE) Elevated high sensitivity troponin I (hsTnI) values and significant  changes across serial measurements may suggest ACS but many other  chronic and acute conditions are known to elevate hsTnI results.  Refer to the "Links" section for chest pain algorithms and additional  guidance. Performed at [facility], [address] [place], [place] [address]   Troponin I (High Sensitivity)     Status: None   Collection Time: 11/12/22  6:42 PM  Result Value Ref Range   Troponin I (High Sensitivity) 4 <18 ng/L    Comment: (NOTE) Elevated high sensitivity troponin I (hsTnI) values and significant  changes across serial measurements may suggest ACS but many other  chronic and acute conditions are known to elevate hsTnI results.  Refer to the "Links" section for chest pain algorithms and additional  guidance. Performed at [facility], [address] [place], [place] [address]   Protime-INR     Status: None   Collection Time: 11/12/22  6:42 PM  Result Value Ref Range   Prothrombin Time 14.9 11.4 - 15.2 seconds   INR 1.2 0.8 - 1.2    Comment: (NOTE) INR goal varies based on device and disease states. Performed at [facility], [address] [place], [place] [address]   APTT     Status: None   Collection Time: 11/12/22  6:42 PM  Result Value Ref Range   aPTT 27  24 - 36 seconds    Comment: Performed at [facility], [address] [place], [place] [address]  Brain natriuretic peptide     Status: None   Collection Time: 11/12/22  6:42 PM  Result Value Ref Range   B Natriuretic Peptide 18.2 0.0 - 100.0 pg/mL    Comment: Performed at [facility], [address] [place], [place] [address]  Hepatic function panel     Status: None   Collection Time: 11/12/22  6:42 PM  Result Value Ref Range   Total  Protein 6.7 6.5 - 8.1 g/dL   Albumin 3.8 3.5 - 5.0 g/dL   AST 16 15 - 41 U/L   ALT 23 0 - 44 U/L   Alkaline Phosphatase 40 38 - 126 U/L   Total Bilirubin 0.5 0.3 - 1.2 mg/dL   Bilirubin, Direct 0.2 0.0 - 0.2 mg/dL   Indirect Bilirubin 0.3 0.3 - 0.9 mg/dL    Comment: Performed at [facility], [address] [place], [place] [address]  Glucose, capillary     Status: Abnormal   Collection Time: 11/12/22  8:00 PM  Result Value Ref Range   Glucose-Capillary 111 (H) 70 - 99 mg/dL    Comment: Glucose reference range applies only to samples taken after fasting for at least 8 hours.  MRSA Next Gen by PCR, Nasal     Status: None   Collection Time: 11/12/22  8:12 PM   Specimen: Nasal Mucosa; Nasal Swab  Result Value Ref Range   MRSA by PCR Next Gen NOT DETECTED NOT DETECTED    Comment: (NOTE) The GeneXpert MRSA Assay (FDA approved for NASAL specimens only), is one component of a comprehensive MRSA colonization surveillance program. It is not intended to diagnose MRSA infection nor to guide or monitor treatment for MRSA infections. Test performance is not FDA approved in patients less than 61 years old. Performed at [facility], [address] [place], [place] [address]   Hemoglobin A1c     Status: Abnormal   Collection Time: 11/12/22  8:33 PM  Result Value Ref Range   Hgb A1c MFr Bld 7.3 (H) 4.8 - 5.6 %    Comment: (NOTE)         Prediabetes: 5.7 - 6.4         Diabetes: >6.4         Glycemic control for adults with diabetes: <7.0    Mean Plasma Glucose 163 mg/dL    Comment: (NOTE) Performed At: [facility] [address] [place], [place] [id number] Jolene Schimke MD [id number]   Glucose, capillary     Status: Abnormal   Collection Time: 11/12/22 11:28 PM  Result Value Ref Range   Glucose-Capillary 100 (H) 70 - 99 mg/dL    Comment: Glucose reference range applies only to samples taken after fasting for at least 8 hours.  Heparin level  (unfractionated)     Status: Abnormal   Collection Time: 11/13/22  1:58 AM  Result Value Ref Range   Heparin Unfractionated 1.08 (H) 0.30 - 0.70 IU/mL    Comment: (NOTE) The clinical reportable range upper limit is being lowered to >1.10 to align with the FDA approved guidance for the current laboratory assay.  If heparin results are below expected values, and patient dosage has  been confirmed, suggest follow up testing of antithrombin III levels. Performed at [facility], [address] [place], [place] [address]   CBC     Status: Abnormal  Collection Time: 11/13/22  1:58 AM  Result Value Ref Range   WBC 11.2 (H) 4.0 - 10.5 K/uL   RBC 3.96 3.87 - 5.11 MIL/uL   Hemoglobin 11.6 (L) 12.0 - 15.0 g/dL   HCT 40.9 (L) 81.1 - 91.4 %   MCV 87.1 80.0 - 100.0 fL   MCH 29.3 26.0 - 34.0 pg   MCHC 33.6 30.0 - 36.0 g/dL   RDW 78.2 95.6 - 21.3 %   Platelets 179 150 - 400 K/uL   nRBC 0.0 0.0 - 0.2 %    Comment: Performed at [facility], [address] [place], [place] [address]  Comprehensive metabolic panel     Status: Abnormal   Collection Time: 11/13/22  1:58 AM  Result Value Ref Range   Sodium 137 135 - 145 mmol/L   Potassium 3.3 (L) 3.5 - 5.1 mmol/L   Chloride 106 98 - 111 mmol/L   CO2 22 22 - 32 mmol/L   Glucose, Bld 119 (H) 70 - 99 mg/dL    Comment: Glucose reference range applies only to samples taken after fasting for at least 8 hours.   BUN 21 (H) 6 - 20 mg/dL   Creatinine, Ser 8.46 (H) 0.44 - 1.00 mg/dL   Calcium 8.4 (L) 8.9 - 10.3 mg/dL   Total Protein 6.5 6.5 - 8.1 g/dL   Albumin 3.7 3.5 - 5.0 g/dL   AST 14 (L) 15 - 41 U/L   ALT 22 0 - 44 U/L   Alkaline Phosphatase 38 38 - 126 U/L   Total Bilirubin 0.6 0.3 - 1.2 mg/dL   GFR, Estimated 53 (L) >60 mL/min    Comment: (NOTE) Calculated using the CKD-EPI Creatinine Equation (2021)    Anion gap 9 5 - 15    Comment: Performed at [facility], [address] [place], [place] [address]  HIV  Antibody (routine testing w rflx)     Status: None   Collection Time: 11/13/22  1:58 AM  Result Value Ref Range   HIV Screen 4th Generation wRfx Non Reactive Non Reactive    Comment: Performed at [facility], [address], [place], [place] [address]  ABO/Rh     Status: None   Collection Time: 11/13/22  1:58 AM  Result Value Ref Range   ABO/RH(D)      A NEG Performed at [facility], [address] [place], [place] [address]   Glucose, capillary     Status: Abnormal   Collection Time: 11/13/22  3:51 AM  Result Value Ref Range   Glucose-Capillary 113 (H) 70 - 99 mg/dL    Comment: Glucose reference range applies only to samples taken after fasting for at least 8 hours.  Glucose, capillary     Status: Abnormal   Collection Time: 11/13/22  7:45 AM  Result Value Ref Range   Glucose-Capillary 117 (H) 70 - 99 mg/dL    Comment: Glucose reference range applies only to samples taken after fasting for at least 8 hours.  Heparin level (unfractionated)     Status: None   Collection Time: 11/13/22 10:08 AM  Result Value Ref Range   Heparin Unfractionated 0.52 0.30 - 0.70 IU/mL    Comment: (NOTE) The clinical reportable range upper limit is being lowered to >1.10 to align with the FDA approved guidance for the current laboratory assay.  If heparin results are below expected values, and patient dosage has  been confirmed, suggest follow up testing of antithrombin III  levels. Performed at [facility], [address] [place], [place] [address]   Type and screen [facility]     Status: None   Collection Time: 11/13/22 10:08 AM  Result Value Ref Range   ABO/RH(D) A NEG    Antibody Screen NEG    Sample Expiration      11/16/2022,2359 Performed at [facility], [address] [place], [place] [address]   Glucose, capillary     Status: Abnormal   Collection Time: 11/13/22 10:50 AM  Result Value Ref Range   Glucose-Capillary 111  (H) 70 - 99 mg/dL    Comment: Glucose reference range applies only to samples taken after fasting for at least 8 hours.  Glucose, capillary     Status: Abnormal   Collection Time: 11/13/22  2:46 PM  Result Value Ref Range   Glucose-Capillary 110 (H) 70 - 99 mg/dL    Comment: Glucose reference range applies only to samples taken after fasting for at least 8 hours.  Glucose, capillary     Status: Abnormal   Collection Time: 11/13/22  4:21 PM  Result Value Ref Range   Glucose-Capillary 134 (H) 70 - 99 mg/dL    Comment: Glucose reference range applies only to samples taken after fasting for at least 8 hours.  Glucose, capillary     Status: Abnormal   Collection Time: 11/13/22  7:20 PM  Result Value Ref Range   Glucose-Capillary 110 (H) 70 - 99 mg/dL    Comment: Glucose reference range applies only to samples taken after fasting for at least 8 hours.  Heparin level (unfractionated)     Status: None   Collection Time: 11/13/22  7:45 PM  Result Value Ref Range   Heparin Unfractionated 0.50 0.30 - 0.70 IU/mL    Comment: (NOTE) The clinical reportable range upper limit is being lowered to >1.10 to align with the FDA approved guidance for the current laboratory assay.  If heparin results are below expected values, and patient dosage has  been confirmed, suggest follow up testing of antithrombin III levels. Performed at [facility], [address] [place], [place] [address]   Glucose, capillary     Status: Abnormal   Collection Time: 11/13/22  9:15 PM  Result Value Ref Range   Glucose-Capillary 148 (H) 70 - 99 mg/dL    Comment: Glucose reference range applies only to samples taken after fasting for at least 8 hours.  CBC     Status: Abnormal   Collection Time: 11/14/22  4:18 AM  Result Value Ref Range   WBC 10.2 4.0 - 10.5 K/uL   RBC 3.65 (L) 3.87 - 5.11 MIL/uL   Hemoglobin 10.5 (L) 12.0 - 15.0 g/dL   HCT 42.5 (L) 95.6 - 38.7 %   MCV 88.5 80.0 - 100.0 fL   MCH 28.8 26.0 -  34.0 pg   MCHC 32.5 30.0 - 36.0 g/dL   RDW 56.4 33.2 - 95.1 %   Platelets 203 150 - 400 K/uL   nRBC 0.0 0.0 - 0.2 %    Comment: Performed at [facility], [address] [place], [place] [address]  Basic metabolic panel     Status: Abnormal   Collection Time: 11/14/22  4:18 AM  Result Value Ref Range   Sodium 138 135 - 145 mmol/L   Potassium 4.0 3.5 - 5.1 mmol/L   Chloride 106 98 - 111 mmol/L   CO2 23 22 - 32 mmol/L   Glucose,  Bld 131 (H) 70 - 99 mg/dL    Comment: Glucose reference range applies only to samples taken after fasting for at least 8 hours.   BUN 18 6 - 20 mg/dL   Creatinine, Ser 7.82 (H) 0.44 - 1.00 mg/dL   Calcium 8.3 (L) 8.9 - 10.3 mg/dL   GFR, Estimated 46 (L) >60 mL/min    Comment: (NOTE) Calculated using the CKD-EPI Creatinine Equation (2021)    Anion gap 9 5 - 15    Comment: Performed at [facility], [address] [place], [place] [address]  Magnesium     Status: Abnormal   Collection Time: 11/14/22  4:18 AM  Result Value Ref Range   Magnesium 1.6 (L) 1.7 - 2.4 mg/dL    Comment: Performed at [facility], [address] [place], [place] [address]  Heparin level (unfractionated)     Status: None   Collection Time: 11/14/22  4:18 AM  Result Value Ref Range   Heparin Unfractionated 0.48 0.30 - 0.70 IU/mL    Comment: (NOTE) The clinical reportable range upper limit is being lowered to >1.10 to align with the FDA approved guidance for the current laboratory assay.  If heparin results are below expected values, and patient dosage has  been confirmed, suggest follow up testing of antithrombin III levels. Performed at [facility], [address] [place], [place] [address]   Glucose, capillary     Status: Abnormal   Collection Time: 11/14/22  7:31 AM  Result Value Ref Range   Glucose-Capillary 127 (H) 70 - 99 mg/dL    Comment: Glucose reference range applies only to samples taken after fasting for at least 8  hours.  ECHOCARDIOGRAM COMPLETE BUBBLE STUDY     Status: None   Collection Time: 11/14/22 10:29 AM  Result Value Ref Range   Ao pk vel 1.64 m/s   AV Area VTI 2.27 cm2   AR max vel 2.25 cm2   AV Mean grad 5.0 mmHg   AV Peak grad 10.8 mmHg   S' Lateral 2.60 cm   AV Area mean vel 2.07 cm2   Area-P 1/2 2.56 cm2   MV VTI 2.57 cm2   Est EF 60 - 65%   Glucose, capillary     Status: Abnormal   Collection Time: 11/14/22 11:22 AM  Result Value Ref Range   Glucose-Capillary 169 (H) 70 - 99 mg/dL    Comment: Glucose reference range applies only to samples taken after fasting for at least 8 hours.  Glucose, capillary     Status: Abnormal   Collection Time: 11/14/22  3:53 PM  Result Value Ref Range   Glucose-Capillary 132 (H) 70 - 99 mg/dL    Comment: Glucose reference range applies only to samples taken after fasting for at least 8 hours.  Glucose, capillary     Status: Abnormal   Collection Time: 11/14/22  9:05 PM  Result Value Ref Range   Glucose-Capillary 126 (H) 70 - 99 mg/dL    Comment: Glucose reference range applies only to samples taken after fasting for at least 8 hours.  Heparin level (unfractionated)     Status: None   Collection Time: 11/15/22  6:14 AM  Result Value Ref Range   Heparin Unfractionated 0.48 0.30 - 0.70 IU/mL    Comment: (NOTE) The clinical reportable range upper limit is being lowered to >1.10 to align with the FDA approved guidance for the current laboratory assay.  If heparin results are below expected values,  and patient dosage has  been confirmed, suggest follow up testing of antithrombin III levels. Performed at [facility], [address] [place], [place] [address]   CBC     Status: Abnormal   Collection Time: 11/15/22  6:14 AM  Result Value Ref Range   WBC 8.9 4.0 - 10.5 K/uL   RBC 3.66 (L) 3.87 - 5.11 MIL/uL   Hemoglobin 10.5 (L) 12.0 - 15.0 g/dL   HCT 62.1 (L) 30.8 - 65.7 %   MCV 88.8 80.0 - 100.0 fL   MCH 28.7 26.0 - 34.0 pg    MCHC 32.3 30.0 - 36.0 g/dL   RDW 84.6 96.2 - 95.2 %   Platelets 205 150 - 400 K/uL   nRBC 0.0 0.0 - 0.2 %    Comment: Performed at [facility], [address], [place], [place] [address]  Basic metabolic panel     Status: Abnormal   Collection Time: 11/15/22  6:14 AM  Result Value Ref Range   Sodium 141 135 - 145 mmol/L   Potassium 3.8 3.5 - 5.1 mmol/L   Chloride 109 98 - 111 mmol/L   CO2 24 22 - 32 mmol/L   Glucose, Bld 135 (H) 70 - 99 mg/dL    Comment: Glucose reference range applies only to samples taken after fasting for at least 8 hours.   BUN 15 6 - 20 mg/dL   Creatinine, Ser 4.40 (H) 0.44 - 1.00 mg/dL   Calcium 8.6 (L) 8.9 - 10.3 mg/dL   GFR, Estimated 49 (L) >60 mL/min    Comment: (NOTE) Calculated using the CKD-EPI Creatinine Equation (2021)    Anion gap 8 5 - 15    Comment: Performed at [facility], [address] [place], [place] [address]  Magnesium     Status: None   Collection Time: 11/15/22  6:14 AM  Result Value Ref Range   Magnesium 2.0 1.7 - 2.4 mg/dL    Comment: Performed at [facility], [address] [place], [place] [address]  Glucose, capillary     Status: Abnormal   Collection Time: 11/15/22  8:18 AM  Result Value Ref Range   Glucose-Capillary 139 (H) 70 - 99 mg/dL    Comment: Glucose reference range applies only to samples taken after fasting for at least 8 hours.  Glucose, capillary     Status: Abnormal   Collection Time: 11/15/22 11:24 AM  Result Value Ref Range   Glucose-Capillary 124 (H) 70 - 99 mg/dL    Comment: Glucose reference range applies only to samples taken after fasting for at least 8 hours.  Glucose, capillary     Status: Abnormal   Collection Time: 11/15/22  5:48 PM  Result Value Ref Range   Glucose-Capillary 169 (H) 70 - 99 mg/dL    Comment: Glucose reference range applies only to samples taken after fasting for at least 8 hours.  Glucose, capillary     Status: Abnormal   Collection Time: 11/15/22   8:40 PM  Result Value Ref Range   Glucose-Capillary 158 (H) 70 - 99 mg/dL    Comment: Glucose reference range applies only to samples taken after fasting for at least 8 hours.  Heparin level (unfractionated)     Status: Abnormal   Collection Time: 11/16/22  5:42 AM  Result Value Ref Range   Heparin Unfractionated >1.10 (H) 0.30 - 0.70 IU/mL    Comment: (NOTE) The clinical reportable range upper limit is being lowered to >1.10 to  align with the FDA approved guidance for the current laboratory assay.  If heparin results are below expected values, and patient dosage has  been confirmed, suggest follow up testing of antithrombin III levels. Performed at [facility], [address] [place], [place] [address]   Basic metabolic panel     Status: Abnormal   Collection Time: 11/16/22  5:42 AM  Result Value Ref Range   Sodium 138 135 - 145 mmol/L   Potassium 3.7 3.5 - 5.1 mmol/L   Chloride 107 98 - 111 mmol/L   CO2 24 22 - 32 mmol/L   Glucose, Bld 146 (H) 70 - 99 mg/dL    Comment: Glucose reference range applies only to samples taken after fasting for at least 8 hours.   BUN 16 6 - 20 mg/dL   Creatinine, Ser 9.14 (H) 0.44 - 1.00 mg/dL   Calcium 8.6 (L) 8.9 - 10.3 mg/dL   GFR, Estimated 54 (L) >60 mL/min    Comment: (NOTE) Calculated using the CKD-EPI Creatinine Equation (2021)    Anion gap 7 5 - 15    Comment: Performed at [facility], [address] [place], [place] [address]  CBC     Status: Abnormal   Collection Time: 11/16/22  5:42 AM  Result Value Ref Range   WBC 9.4 4.0 - 10.5 K/uL   RBC 2.85 (L) 3.87 - 5.11 MIL/uL   Hemoglobin 8.3 (L) 12.0 - 15.0 g/dL   HCT 62.1 (L) 30.8 - 65.7 %   MCV 89.5 80.0 - 100.0 fL   MCH 29.1 26.0 - 34.0 pg   MCHC 32.5 30.0 - 36.0 g/dL   RDW 84.6 96.2 - 95.2 %   Platelets 183 150 - 400 K/uL   nRBC 0.0 0.0 - 0.2 %    Comment: Performed at [facility], [address] [place], [place] [address]  Glucose,  capillary     Status: Abnormal   Collection Time: 11/16/22  9:15 AM  Result Value Ref Range   Glucose-Capillary 205 (H) 70 - 99 mg/dL    Comment: Glucose reference range applies only to samples taken after fasting for at least 8 hours.  Glucose, capillary     Status: Abnormal   Collection Time: 11/16/22 11:50 AM  Result Value Ref Range   Glucose-Capillary 152 (H) 70 - 99 mg/dL    Comment: Glucose reference range applies only to samples taken after fasting for at least 8 hours.  Hemoglobin and hematocrit, blood     Status: Abnormal   Collection Time: 11/16/22 12:53 PM  Result Value Ref Range   Hemoglobin 8.9 (L) 12.0 - 15.0 g/dL   HCT 44.0 (L) 10.2 - 72.5 %    Comment: Performed at [facility], [address], [place], [place] [address]    Radiology VAS [place] LOWER EXTREMITY VENOUS (DVT)  Result Date: 01/07/2023  Lower Venous DVT Study Patient Name:  Cynthia Mccarthy  Date of Exam:   01/07/2023 Medical Rec #: [id number]         Accession #:    [id number] Date of Birth: 16-Apr-1971          Patient Gender: F Patient Age:   13 years Exam Location:  [facility] Procedure:      VAS [place] LOWER EXTREMITY VENOUS (DVT) Referring Phys: Festus Barren --------------------------------------------------------------------------------  Indications: F/U Lt LE DVT.  Risk Factors: DVT 11/12/2022: Lt LE DVT Surgery 11/13/2022: Pulmonary Thrombectomy/IVC Inserted. 11/15/2022: Thrombectomy Lt LE. Performing  Technologist: Debbe Bales RVS  Examination Guidelines: A complete evaluation includes B-mode imaging, spectral Doppler, color Doppler, and power Doppler as needed of all accessible portions of each vessel. Bilateral testing is considered an integral part of a complete examination. Limited examinations for reoccurring indications may be performed as noted. The reflux portion of the exam is performed with the patient in reverse Trendelenburg.   +-----+---------------+---------+-----------+----------+--------------+ RIGHTCompressibilityPhasicitySpontaneityPropertiesThrombus Aging +-----+---------------+---------+-----------+----------+--------------+ CFV  Full           Yes      Yes                                 +-----+---------------+---------+-----------+----------+--------------+   +---------+---------------+---------+-----------+----------+--------------+ LEFT     CompressibilityPhasicitySpontaneityPropertiesThrombus Aging +---------+---------------+---------+-----------+----------+--------------+ CFV      Full           Yes      Yes                                 +---------+---------------+---------+-----------+----------+--------------+ SFJ      Full           Yes      Yes                                 +---------+---------------+---------+-----------+----------+--------------+ FV Prox  Full           Yes      Yes                                 +---------+---------------+---------+-----------+----------+--------------+ FV Mid   Full           Yes      Yes                                 +---------+---------------+---------+-----------+----------+--------------+ FV DistalFull           Yes      Yes                                 +---------+---------------+---------+-----------+----------+--------------+ PFV      Full           Yes      Yes                                 +---------+---------------+---------+-----------+----------+--------------+ POP      Full           Yes      Yes                                 +---------+---------------+---------+-----------+----------+--------------+ PTV      Full           Yes      Yes                                 +---------+---------------+---------+-----------+----------+--------------+ PERO     Full           Yes  Yes                                  +---------+---------------+---------+-----------+----------+--------------+ GSV      Full           Yes      Yes                                 +---------+---------------+---------+-----------+----------+--------------+ SSV      Full           Yes      Yes                                 +---------+---------------+---------+-----------+----------+--------------+    Summary: RIGHT: - No evidence of common femoral vein obstruction.  LEFT: - No evidence of deep vein thrombosis in the lower extremity. No indirect evidence of obstruction proximal to the inguinal ligament. - Findings suggest resolution of previously noted thrombus. - There is no evidence of deep vein thrombosis in the lower extremity. - There is no evidence of deep vein thrombosis proximal to the inguinal ligament or in the common femoral vein. - There is no evidence of deep vein thrombosis in the lower extremity. However, portions of this examination were limited- see technologist comments above. - There is no evidence of chronic venous insufficiency. - There is no evidence of superficial venous thrombosis.  *See table(s) above for measurements and observations. Electronically signed by Festus Barren MD on 01/07/2023 at 3:51:23 PM.    Final     Assessment/Plan  Acute deep vein thrombosis (DVT) of left lower extremity (HCC) Resolved with anticoagulation and thrombectomy  Acute pulmonary embolism (HCC) Had pulmonary thrombectomy.  On anticoagulation.  Markedly improved.  Essential hypertension blood pressure control important in reducing the progression of atherosclerotic disease. On appropriate oral medications.   Stage 3a chronic kidney disease (HCC) Hydrate and limit contrast with procedures  Presence of IVC filter The patient has been on anticoagulation for 2 months and has no significant residual lower extremity DVT.  Her filter can now be safely removed with essentially no risk of thromboembolic complications in the  short-term.  I would generally recommend anticoagulation for 1 year after extensive pulmonary embolus and DVT.    Festus Barren, MD  01/07/2023 5:02 PM    This note was created with Dragon medical transcription system.  Any errors from dictation are purely unintentional

## 2023-01-07 NOTE — Assessment & Plan Note (Signed)
blood pressure control important in reducing the progression of atherosclerotic disease. On appropriate oral medications.  

## 2023-01-07 NOTE — Assessment & Plan Note (Signed)
Hydrate and limit contrast with procedures.

## 2023-01-07 NOTE — Assessment & Plan Note (Signed)
Resolved with anticoagulation and thrombectomy

## 2023-01-07 NOTE — Assessment & Plan Note (Signed)
Had pulmonary thrombectomy.  On anticoagulation.  Markedly improved.

## 2023-01-07 NOTE — Assessment & Plan Note (Signed)
The patient has been on anticoagulation for 2 months and has no significant residual lower extremity DVT.  Her filter can now be safely removed with essentially no risk of thromboembolic complications in the short-term.  I would generally recommend anticoagulation for 1 year after extensive pulmonary embolus and DVT.

## 2023-01-07 NOTE — Patient Instructions (Signed)
Inferior Vena Cava Filter Removal  Inferior vena cava filter removal is a procedure to take out a metal filter that was placed into a large vein in the abdomen (inferior vena cava, or IVC). An IVC filter prevents blood clots in the legs or pelvis from traveling to the lungs. Some IVC filters are designed to be removed (retrievable filters). You may have your filter removed when the danger of forming blood clots has passed or when you can take blood-thinning medicine to prevent blood clots. In some cases, the filter may need to be removed because it becomes damaged, is not working, or is causing problems. Most filters can be removed through the vein (percutaneously). In the rare cases when the health care provider is unable to remove the filter percutaneously, one of these steps may be taken: A more invasive, open surgery. The filter may be left in place. Tell a health care provider about: Any allergies you have, including iodine. All medicines you are taking, including vitamins, herbs, eye drops, creams, and over-the-counter medicines. Any problems you or family members have had with anesthetic medicines or with contrast dyes that are used during imaging tests. Any bleeding problems you have. Any surgeries you have had. Any medical conditions you have. Whether you are pregnant or may be pregnant. What are the risks? Generally, this is a safe procedure. However, serious problems may occur, especially if the filter has been in for more than a few months. Possible problems include: Infection. Bleeding. Allergic reactions to medicines or dyes. Damage to the IVC, other blood vessels, or nearby structures. A blood clot or a piece of the filter breaking loose and traveling to the lungs. What happens before the procedure? When to stop eating and drinking Follow instructions from your health care provider about what you may eat and drink before your procedure. These may include: 8 hours before your  procedure Stop eating most foods. Do not eat meat, fried foods, or fatty foods. Eat only light foods, such as toast or crackers. All liquids are okay except energy drinks and alcohol. 6 hours before your procedure Stop eating. Drink only clear liquids, such as water, clear fruit juice, black coffee, plain tea, and sports drinks. Do not drink energy drinks or alcohol. 2 hours before your procedure Stop drinking all liquids. You may be allowed to take medicines with small sips of water. If you do not follow your health care provider's instructions, your procedure may be delayed or canceled. Medicines Ask your health care provider about: Changing or stopping your regular medicines. This is especially important if you are taking diabetes medicines or blood thinners. Taking medicines such as aspirin and ibuprofen. These medicines can thin your blood. Do not take these medicines unless your health care provider tells you to take them. Taking over-the-counter medicines, vitamins, herbs, and supplements. General instructions Do not use any products that contain nicotine or tobacco for at least 4 weeks before the procedure. These products include cigarettes, chewing tobacco, and vaping devices, such as e-cigarettes. If you need help quitting, ask your health care provider. Ask your health care provider: How your surgery site will be marked. What steps will be taken to help prevent infection. These steps may include: Removing hair at the surgery site. Washing skin with a germ-killing soap. Receiving antibiotic medicine. If you will be going home right after the procedure, plan to have a responsible adult: Take you home from the hospital or clinic. You will not be allowed to drive. Care for  you for the time you are told. What happens during the procedure? An IV will be inserted into one of your veins. You will be given one or more of the following: A medicine to help you relax (sedative). A  medicine to numb the area (local anesthetic). A medicine to make you fall asleep (general anesthetic). The procedure will be done through a vein in your groin or neck that leads to the IVC. A small incision will be made over the vein. A long, thin tube (catheter) will be inserted into the vein. The catheter will be moved through your vein and into your IVC. X-rays may be done to help guide the catheter into place. Dye may be injected through the catheter before the X-rays to make the catheter and filter easier to see. When the catheter reaches the filter, a hook (snare) on the end of the catheter may be used to latch on to the filter. In some cases, a grasping instrument (forceps) may be threaded through the catheter to gently grab and remove the filter instead. After the filter has been hooked or grasped, the filter and instruments will be pulled out through the catheter. The catheter will be removed through the insertion site in your skin. Pressure will be placed over your insertion site until bleeding stops. A bandage (dressing) will be placed over the catheter insertion site. The procedure may vary among health care providers and hospitals. What happens after the procedure? Your blood pressure, heart rate, breathing rate, and blood oxygen level will be monitored until you leave the hospital or clinic. You may need to stay in bed (be on bed rest) for a period of time. Your insertion site will be monitored for the first few hours for any signs of bleeding. If you were given a sedative during the procedure, it can affect you for several hours. Do not drive or operate machinery until your health care provider says that it is safe. Summary Inferior vena cava (IVC) filter removal is a procedure to take out a filter that was placed to prevent blood clots from traveling to your lungs. You may have your filter removed when the danger of forming blood clots has passed or when you can take  blood-thinning medicines to prevent blood clots. In some cases, a filter is removed because there is a problem with it. A long, thin tube (catheter) will be inserted through a vein in your neck or groin. Then, the filter will be gently grasped and pulled out through the catheter. Plan to have a responsible adult care for you for the time you are told. This information is not intended to replace advice given to you by your health care provider. Make sure you discuss any questions you have with your health care provider. Document Revised: 05/22/2021 Document Reviewed: 05/22/2021 Elsevier Patient Education  2024 [employer].

## 2023-01-07 NOTE — H&P (View-Only) (Signed)
MRN : [id number]  Cynthia Mccarthy is a 52 y.o. (05/11/1971) female who presents with chief complaint of  Chief Complaint  Patient presents with   Follow-up  .  History of Present Illness: Patient returns today in follow up of her DVT and PE.  She is doing well.  She says her breathing may not be 100%, but it is good.  Her legs are not bothering her at all.  She is tolerating anticoagulation.  She is having issues with her urostomy, but not secondary to bleeding.  This was an infectious issue. She has now been on anticoagulation for approximately 2 months.  A duplex today shows resolution of the left lower extremity DVT with no DVT noted on duplex today.  Current Outpatient Medications  Medication Sig Dispense Refill   apixaban (ELIQUIS) 5 MG TABS tablet Take 1 tablet (5 mg total) by mouth 2 (two) times daily. 60 tablet 2   busPIRone (BUSPAR) 10 MG tablet Take 10 mg by mouth 2 (two) times daily.     cariprazine (VRAYLAR) 1.5 MG capsule Take 1.5 mg by mouth daily.     Catheters (INTERMITTENT 14FR/40CM) MISC Use for urinary self-catheterization 3 times a day as directed     citalopram (CELEXA) 40 MG tablet Take 40 mg by mouth daily.     famotidine (PEPCID) 40 MG tablet Take 40 mg by mouth daily.     levothyroxine (SYNTHROID) 100 MCG tablet Take 100 mcg by mouth daily before breakfast.     metFORMIN (GLUCOPHAGE) 500 MG tablet Take 500 mg by mouth daily.     rosuvastatin (CRESTOR) 5 MG tablet Take 5 mg by mouth daily.     SYMBICORT 160-4.5 MCG/ACT inhaler Inhale into the lungs.     traZODone (DESYREL) 100 MG tablet Take 100 mg by mouth at bedtime. Take 100 mg by mouth nightly Take 2 tabs if needed     vitamin B-12 (CYANOCOBALAMIN) 1000 MCG tablet Take by mouth.     No current facility-administered medications for this visit.    Past Medical History:  Diagnosis Date   Anxiety    Cervical cancer (HCC)    Depression    Diabetes mellitus without complication (HCC)    DVT (deep venous  thrombosis) (HCC)    Hormone disorder    Hypertension    Hypokalemia 11/13/2022   Hypomagnesemia 11/14/2022   Hypothyroidism     Past Surgical History:  Procedure Laterality Date   ABDOMINAL HYSTERECTOMY     IVC FILTER INSERTION N/A 11/13/2022   Procedure: IVC FILTER INSERTION;  Surgeon: Annice Needy, MD;  Location: [facility];  Service: Cardiovascular;  Laterality: N/A;   PERIPHERAL VASCULAR THROMBECTOMY Left 11/15/2022   Procedure: PERIPHERAL VASCULAR THROMBECTOMY;  Surgeon: Annice Needy, MD;  Location: [facility];  Service: Cardiovascular;  Laterality: Left;   PULMONARY THROMBECTOMY Bilateral 11/13/2022   Procedure: PULMONARY THROMBECTOMY;  Surgeon: Annice Needy, MD;  Location: [facility];  Service: Cardiovascular;  Laterality: Bilateral;     Social History   Tobacco Use   Smoking status: Never    Passive exposure: Never   Smokeless tobacco: Never  Vaping Use   Vaping status: Never Used  Substance Use Topics   Alcohol use: Yes   Drug use: No      Family History  Problem Relation Age of Onset   Bladder Cancer Neg Hx    Kidney cancer Neg Hx   No bleeding or clotting disorders  Allergies  Allergen Reactions   Penicillins Rash    Has patient had a PCN reaction causing immediate rash, facial/tongue/throat swelling, SOB or lightheadedness with hypotension: Yes Has patient had a PCN reaction causing severe rash involving mucus membranes or skin necrosis: No Has patient had a PCN reaction that required hospitalization: No Has patient had a PCN reaction occurring within the last 10 years: Yes If all of the above answers are "NO", then may proceed with Cephalosporin use.   Melatonin     Other reaction(s): Other (See Comments), Other (See Comments) Pt. States have nightmares Pt. States have nightmares    Sulfa Antibiotics Hives and Rash       REVIEW OF SYSTEMS (Negative unless checked)   Constitutional: [] Weight loss  [] Fever   [] Chills Cardiac: [] Chest pain   [] Chest pressure   [] Palpitations   [] Shortness of breath when laying flat   [] Shortness of breath at rest   [x] Shortness of breath with exertion. Vascular:  [] Pain in legs with walking   [] Pain in legs at rest   [] Pain in legs when laying flat   [] Claudication   [] Pain in feet when walking  [] Pain in feet at rest  [] Pain in feet when laying flat   [x] History of DVT   [x] Phlebitis   [] Swelling in legs   [] Varicose veins   [] Non-healing ulcers Pulmonary:   [] Uses home oxygen   [] Productive cough   [] Hemoptysis   [] Wheeze  [] COPD   [] Asthma Neurologic:  [] Dizziness  [] Blackouts   [] Seizures   [] History of stroke   [] History of TIA  [] Aphasia   [] Temporary blindness   [] Dysphagia   [] Weakness or numbness in arms   [] Weakness or numbness in legs Musculoskeletal:  [] Arthritis   [] Joint swelling   [x] Joint pain   [] Low back pain Hematologic:  [] Easy bruising  [] Easy bleeding   [] Hypercoagulable state   [] Anemic   Gastrointestinal:  [] Blood in stool   [] Vomiting blood  [] Gastroesophageal reflux/heartburn   [] Abdominal pain Genitourinary:  [] Chronic kidney disease   [] Difficult urination  [] Frequent urination  [] Burning with urination   [] Hematuria Skin:  [] Rashes   [] Ulcers   [] Wounds Psychological:  [] History of anxiety   []  History of major depression.    Physical Examination  BP 138/85 (BP Location: Right Arm, Patient Position: Sitting, Cuff Size: Large)   Pulse 71   Wt 191 lb 12.8 oz (87 kg)   BMI 35.08 kg/m  Gen:  WD/WN, NAD Head: [place]/AT, No temporalis wasting. Ear/Nose/Throat: Hearing grossly intact, nares w/o erythema or drainage Eyes: Conjunctiva clear. Sclera non-icteric Neck: Supple.  Trachea midline Pulmonary:  Good air movement, no use of accessory muscles.  Cardiac: RRR, no JVD Vascular:  Vessel Right Left  Radial Palpable Palpable               Musculoskeletal: M/S 5/5 throughout.  No deformity or atrophy. No appreciable LE edema. Neurologic:  Sensation grossly intact in extremities.  Symmetrical.  Speech is fluent.  Psychiatric: Judgment intact, Mood & affect appropriate for pt's clinical situation. Dermatologic: No rashes or ulcers noted.  No cellulitis or open wounds.      Labs Recent Results (from the past 2160 hour(s))  CBC with Differential     Status: Abnormal   Collection Time: 11/12/22  3:58 PM  Result Value Ref Range   WBC 13.0 (H) 4.0 - 10.5 K/uL   RBC 4.34 3.87 - 5.11 MIL/uL   Hemoglobin 12.3 12.0 - 15.0 g/dL   HCT 40.9  36.0 - 46.0 %   MCV 88.7 80.0 - 100.0 fL   MCH 28.3 26.0 - 34.0 pg   MCHC 31.9 30.0 - 36.0 g/dL   RDW 64.3 32.9 - 51.8 %   Platelets 194 150 - 400 K/uL   nRBC 0.0 0.0 - 0.2 %   Neutrophils Relative % 72 %   Neutro Abs 9.3 (H) 1.7 - 7.7 K/uL   Lymphocytes Relative 17 %   Lymphs Abs 2.3 0.7 - 4.0 K/uL   Monocytes Relative 7 %   Monocytes Absolute 0.9 0.1 - 1.0 K/uL   Eosinophils Relative 3 %   Eosinophils Absolute 0.4 0.0 - 0.5 K/uL   Basophils Relative 1 %   Basophils Absolute 0.1 0.0 - 0.1 K/uL   Immature Granulocytes 0 %   Abs Immature Granulocytes 0.05 0.00 - 0.07 K/uL    Comment: Performed at [facility], [address] [place], [place] [address]  Basic metabolic panel     Status: Abnormal   Collection Time: 11/12/22  3:58 PM  Result Value Ref Range   Sodium 136 135 - 145 mmol/L   Potassium 3.3 (L) 3.5 - 5.1 mmol/L   Chloride 102 98 - 111 mmol/L   CO2 25 22 - 32 mmol/L   Glucose, Bld 124 (H) 70 - 99 mg/dL    Comment: Glucose reference range applies only to samples taken after fasting for at least 8 hours.   BUN 22 (H) 6 - 20 mg/dL   Creatinine, Ser 0.63 (H) 0.44 - 1.00 mg/dL   Calcium 9.1 8.9 - 01.6 mg/dL   GFR, Estimated 48 (L) >60 mL/min    Comment: (NOTE) Calculated using the CKD-EPI Creatinine Equation (2021)    Anion gap 9 5 - 15    Comment: Performed at [facility], [address] [place], [place] [address]  Troponin I (High Sensitivity)      Status: None   Collection Time: 11/12/22  3:58 PM  Result Value Ref Range   Troponin I (High Sensitivity) 5 <18 ng/L    Comment: (NOTE) Elevated high sensitivity troponin I (hsTnI) values and significant  changes across serial measurements may suggest ACS but many other  chronic and acute conditions are known to elevate hsTnI results.  Refer to the "Links" section for chest pain algorithms and additional  guidance. Performed at [facility], [address] [place], [place] [address]   Troponin I (High Sensitivity)     Status: None   Collection Time: 11/12/22  6:42 PM  Result Value Ref Range   Troponin I (High Sensitivity) 4 <18 ng/L    Comment: (NOTE) Elevated high sensitivity troponin I (hsTnI) values and significant  changes across serial measurements may suggest ACS but many other  chronic and acute conditions are known to elevate hsTnI results.  Refer to the "Links" section for chest pain algorithms and additional  guidance. Performed at [facility], [address] [place], [place] [address]   Protime-INR     Status: None   Collection Time: 11/12/22  6:42 PM  Result Value Ref Range   Prothrombin Time 14.9 11.4 - 15.2 seconds   INR 1.2 0.8 - 1.2    Comment: (NOTE) INR goal varies based on device and disease states. Performed at [facility], [address] [place], [place] [address]   APTT     Status: None   Collection Time: 11/12/22  6:42 PM  Result Value Ref Range   aPTT 27  24 - 36 seconds    Comment: Performed at [facility], [address] [place], [place] [address]  Brain natriuretic peptide     Status: None   Collection Time: 11/12/22  6:42 PM  Result Value Ref Range   B Natriuretic Peptide 18.2 0.0 - 100.0 pg/mL    Comment: Performed at [facility], [address] [place], [place] [address]  Hepatic function panel     Status: None   Collection Time: 11/12/22  6:42 PM  Result Value Ref Range   Total  Protein 6.7 6.5 - 8.1 g/dL   Albumin 3.8 3.5 - 5.0 g/dL   AST 16 15 - 41 U/L   ALT 23 0 - 44 U/L   Alkaline Phosphatase 40 38 - 126 U/L   Total Bilirubin 0.5 0.3 - 1.2 mg/dL   Bilirubin, Direct 0.2 0.0 - 0.2 mg/dL   Indirect Bilirubin 0.3 0.3 - 0.9 mg/dL    Comment: Performed at [facility], [address] [place], [place] [address]  Glucose, capillary     Status: Abnormal   Collection Time: 11/12/22  8:00 PM  Result Value Ref Range   Glucose-Capillary 111 (H) 70 - 99 mg/dL    Comment: Glucose reference range applies only to samples taken after fasting for at least 8 hours.  MRSA Next Gen by PCR, Nasal     Status: None   Collection Time: 11/12/22  8:12 PM   Specimen: Nasal Mucosa; Nasal Swab  Result Value Ref Range   MRSA by PCR Next Gen NOT DETECTED NOT DETECTED    Comment: (NOTE) The GeneXpert MRSA Assay (FDA approved for NASAL specimens only), is one component of a comprehensive MRSA colonization surveillance program. It is not intended to diagnose MRSA infection nor to guide or monitor treatment for MRSA infections. Test performance is not FDA approved in patients less than 61 years old. Performed at [facility], [address] [place], [place] [address]   Hemoglobin A1c     Status: Abnormal   Collection Time: 11/12/22  8:33 PM  Result Value Ref Range   Hgb A1c MFr Bld 7.3 (H) 4.8 - 5.6 %    Comment: (NOTE)         Prediabetes: 5.7 - 6.4         Diabetes: >6.4         Glycemic control for adults with diabetes: <7.0    Mean Plasma Glucose 163 mg/dL    Comment: (NOTE) Performed At: [facility] [address] [place], [place] [id number] Jolene Schimke MD [id number]   Glucose, capillary     Status: Abnormal   Collection Time: 11/12/22 11:28 PM  Result Value Ref Range   Glucose-Capillary 100 (H) 70 - 99 mg/dL    Comment: Glucose reference range applies only to samples taken after fasting for at least 8 hours.  Heparin level  (unfractionated)     Status: Abnormal   Collection Time: 11/13/22  1:58 AM  Result Value Ref Range   Heparin Unfractionated 1.08 (H) 0.30 - 0.70 IU/mL    Comment: (NOTE) The clinical reportable range upper limit is being lowered to >1.10 to align with the FDA approved guidance for the current laboratory assay.  If heparin results are below expected values, and patient dosage has  been confirmed, suggest follow up testing of antithrombin III levels. Performed at [facility], [address] [place], [place] [address]   CBC     Status: Abnormal  Collection Time: 11/13/22  1:58 AM  Result Value Ref Range   WBC 11.2 (H) 4.0 - 10.5 K/uL   RBC 3.96 3.87 - 5.11 MIL/uL   Hemoglobin 11.6 (L) 12.0 - 15.0 g/dL   HCT 40.9 (L) 81.1 - 91.4 %   MCV 87.1 80.0 - 100.0 fL   MCH 29.3 26.0 - 34.0 pg   MCHC 33.6 30.0 - 36.0 g/dL   RDW 78.2 95.6 - 21.3 %   Platelets 179 150 - 400 K/uL   nRBC 0.0 0.0 - 0.2 %    Comment: Performed at [facility], [address] [place], [place] [address]  Comprehensive metabolic panel     Status: Abnormal   Collection Time: 11/13/22  1:58 AM  Result Value Ref Range   Sodium 137 135 - 145 mmol/L   Potassium 3.3 (L) 3.5 - 5.1 mmol/L   Chloride 106 98 - 111 mmol/L   CO2 22 22 - 32 mmol/L   Glucose, Bld 119 (H) 70 - 99 mg/dL    Comment: Glucose reference range applies only to samples taken after fasting for at least 8 hours.   BUN 21 (H) 6 - 20 mg/dL   Creatinine, Ser 8.46 (H) 0.44 - 1.00 mg/dL   Calcium 8.4 (L) 8.9 - 10.3 mg/dL   Total Protein 6.5 6.5 - 8.1 g/dL   Albumin 3.7 3.5 - 5.0 g/dL   AST 14 (L) 15 - 41 U/L   ALT 22 0 - 44 U/L   Alkaline Phosphatase 38 38 - 126 U/L   Total Bilirubin 0.6 0.3 - 1.2 mg/dL   GFR, Estimated 53 (L) >60 mL/min    Comment: (NOTE) Calculated using the CKD-EPI Creatinine Equation (2021)    Anion gap 9 5 - 15    Comment: Performed at [facility], [address] [place], [place] [address]  HIV  Antibody (routine testing w rflx)     Status: None   Collection Time: 11/13/22  1:58 AM  Result Value Ref Range   HIV Screen 4th Generation wRfx Non Reactive Non Reactive    Comment: Performed at [facility], [address], [place], [place] [address]  ABO/Rh     Status: None   Collection Time: 11/13/22  1:58 AM  Result Value Ref Range   ABO/RH(D)      A NEG Performed at [facility], [address] [place], [place] [address]   Glucose, capillary     Status: Abnormal   Collection Time: 11/13/22  3:51 AM  Result Value Ref Range   Glucose-Capillary 113 (H) 70 - 99 mg/dL    Comment: Glucose reference range applies only to samples taken after fasting for at least 8 hours.  Glucose, capillary     Status: Abnormal   Collection Time: 11/13/22  7:45 AM  Result Value Ref Range   Glucose-Capillary 117 (H) 70 - 99 mg/dL    Comment: Glucose reference range applies only to samples taken after fasting for at least 8 hours.  Heparin level (unfractionated)     Status: None   Collection Time: 11/13/22 10:08 AM  Result Value Ref Range   Heparin Unfractionated 0.52 0.30 - 0.70 IU/mL    Comment: (NOTE) The clinical reportable range upper limit is being lowered to >1.10 to align with the FDA approved guidance for the current laboratory assay.  If heparin results are below expected values, and patient dosage has  been confirmed, suggest follow up testing of antithrombin III  levels. Performed at [facility], [address] [place], [place] [address]   Type and screen [facility]     Status: None   Collection Time: 11/13/22 10:08 AM  Result Value Ref Range   ABO/RH(D) A NEG    Antibody Screen NEG    Sample Expiration      11/16/2022,2359 Performed at [facility], [address] [place], [place] [address]   Glucose, capillary     Status: Abnormal   Collection Time: 11/13/22 10:50 AM  Result Value Ref Range   Glucose-Capillary 111  (H) 70 - 99 mg/dL    Comment: Glucose reference range applies only to samples taken after fasting for at least 8 hours.  Glucose, capillary     Status: Abnormal   Collection Time: 11/13/22  2:46 PM  Result Value Ref Range   Glucose-Capillary 110 (H) 70 - 99 mg/dL    Comment: Glucose reference range applies only to samples taken after fasting for at least 8 hours.  Glucose, capillary     Status: Abnormal   Collection Time: 11/13/22  4:21 PM  Result Value Ref Range   Glucose-Capillary 134 (H) 70 - 99 mg/dL    Comment: Glucose reference range applies only to samples taken after fasting for at least 8 hours.  Glucose, capillary     Status: Abnormal   Collection Time: 11/13/22  7:20 PM  Result Value Ref Range   Glucose-Capillary 110 (H) 70 - 99 mg/dL    Comment: Glucose reference range applies only to samples taken after fasting for at least 8 hours.  Heparin level (unfractionated)     Status: None   Collection Time: 11/13/22  7:45 PM  Result Value Ref Range   Heparin Unfractionated 0.50 0.30 - 0.70 IU/mL    Comment: (NOTE) The clinical reportable range upper limit is being lowered to >1.10 to align with the FDA approved guidance for the current laboratory assay.  If heparin results are below expected values, and patient dosage has  been confirmed, suggest follow up testing of antithrombin III levels. Performed at [facility], [address] [place], [place] [address]   Glucose, capillary     Status: Abnormal   Collection Time: 11/13/22  9:15 PM  Result Value Ref Range   Glucose-Capillary 148 (H) 70 - 99 mg/dL    Comment: Glucose reference range applies only to samples taken after fasting for at least 8 hours.  CBC     Status: Abnormal   Collection Time: 11/14/22  4:18 AM  Result Value Ref Range   WBC 10.2 4.0 - 10.5 K/uL   RBC 3.65 (L) 3.87 - 5.11 MIL/uL   Hemoglobin 10.5 (L) 12.0 - 15.0 g/dL   HCT 42.5 (L) 95.6 - 38.7 %   MCV 88.5 80.0 - 100.0 fL   MCH 28.8 26.0 -  34.0 pg   MCHC 32.5 30.0 - 36.0 g/dL   RDW 56.4 33.2 - 95.1 %   Platelets 203 150 - 400 K/uL   nRBC 0.0 0.0 - 0.2 %    Comment: Performed at [facility], [address] [place], [place] [address]  Basic metabolic panel     Status: Abnormal   Collection Time: 11/14/22  4:18 AM  Result Value Ref Range   Sodium 138 135 - 145 mmol/L   Potassium 4.0 3.5 - 5.1 mmol/L   Chloride 106 98 - 111 mmol/L   CO2 23 22 - 32 mmol/L   Glucose,  Bld 131 (H) 70 - 99 mg/dL    Comment: Glucose reference range applies only to samples taken after fasting for at least 8 hours.   BUN 18 6 - 20 mg/dL   Creatinine, Ser 7.82 (H) 0.44 - 1.00 mg/dL   Calcium 8.3 (L) 8.9 - 10.3 mg/dL   GFR, Estimated 46 (L) >60 mL/min    Comment: (NOTE) Calculated using the CKD-EPI Creatinine Equation (2021)    Anion gap 9 5 - 15    Comment: Performed at [facility], [address] [place], [place] [address]  Magnesium     Status: Abnormal   Collection Time: 11/14/22  4:18 AM  Result Value Ref Range   Magnesium 1.6 (L) 1.7 - 2.4 mg/dL    Comment: Performed at [facility], [address] [place], [place] [address]  Heparin level (unfractionated)     Status: None   Collection Time: 11/14/22  4:18 AM  Result Value Ref Range   Heparin Unfractionated 0.48 0.30 - 0.70 IU/mL    Comment: (NOTE) The clinical reportable range upper limit is being lowered to >1.10 to align with the FDA approved guidance for the current laboratory assay.  If heparin results are below expected values, and patient dosage has  been confirmed, suggest follow up testing of antithrombin III levels. Performed at [facility], [address] [place], [place] [address]   Glucose, capillary     Status: Abnormal   Collection Time: 11/14/22  7:31 AM  Result Value Ref Range   Glucose-Capillary 127 (H) 70 - 99 mg/dL    Comment: Glucose reference range applies only to samples taken after fasting for at least 8  hours.  ECHOCARDIOGRAM COMPLETE BUBBLE STUDY     Status: None   Collection Time: 11/14/22 10:29 AM  Result Value Ref Range   Ao pk vel 1.64 m/s   AV Area VTI 2.27 cm2   AR max vel 2.25 cm2   AV Mean grad 5.0 mmHg   AV Peak grad 10.8 mmHg   S' Lateral 2.60 cm   AV Area mean vel 2.07 cm2   Area-P 1/2 2.56 cm2   MV VTI 2.57 cm2   Est EF 60 - 65%   Glucose, capillary     Status: Abnormal   Collection Time: 11/14/22 11:22 AM  Result Value Ref Range   Glucose-Capillary 169 (H) 70 - 99 mg/dL    Comment: Glucose reference range applies only to samples taken after fasting for at least 8 hours.  Glucose, capillary     Status: Abnormal   Collection Time: 11/14/22  3:53 PM  Result Value Ref Range   Glucose-Capillary 132 (H) 70 - 99 mg/dL    Comment: Glucose reference range applies only to samples taken after fasting for at least 8 hours.  Glucose, capillary     Status: Abnormal   Collection Time: 11/14/22  9:05 PM  Result Value Ref Range   Glucose-Capillary 126 (H) 70 - 99 mg/dL    Comment: Glucose reference range applies only to samples taken after fasting for at least 8 hours.  Heparin level (unfractionated)     Status: None   Collection Time: 11/15/22  6:14 AM  Result Value Ref Range   Heparin Unfractionated 0.48 0.30 - 0.70 IU/mL    Comment: (NOTE) The clinical reportable range upper limit is being lowered to >1.10 to align with the FDA approved guidance for the current laboratory assay.  If heparin results are below expected values,  and patient dosage has  been confirmed, suggest follow up testing of antithrombin III levels. Performed at [facility], [address] [place], [place] [address]   CBC     Status: Abnormal   Collection Time: 11/15/22  6:14 AM  Result Value Ref Range   WBC 8.9 4.0 - 10.5 K/uL   RBC 3.66 (L) 3.87 - 5.11 MIL/uL   Hemoglobin 10.5 (L) 12.0 - 15.0 g/dL   HCT 62.1 (L) 30.8 - 65.7 %   MCV 88.8 80.0 - 100.0 fL   MCH 28.7 26.0 - 34.0 pg    MCHC 32.3 30.0 - 36.0 g/dL   RDW 84.6 96.2 - 95.2 %   Platelets 205 150 - 400 K/uL   nRBC 0.0 0.0 - 0.2 %    Comment: Performed at [facility], [address], [place], [place] [address]  Basic metabolic panel     Status: Abnormal   Collection Time: 11/15/22  6:14 AM  Result Value Ref Range   Sodium 141 135 - 145 mmol/L   Potassium 3.8 3.5 - 5.1 mmol/L   Chloride 109 98 - 111 mmol/L   CO2 24 22 - 32 mmol/L   Glucose, Bld 135 (H) 70 - 99 mg/dL    Comment: Glucose reference range applies only to samples taken after fasting for at least 8 hours.   BUN 15 6 - 20 mg/dL   Creatinine, Ser 4.40 (H) 0.44 - 1.00 mg/dL   Calcium 8.6 (L) 8.9 - 10.3 mg/dL   GFR, Estimated 49 (L) >60 mL/min    Comment: (NOTE) Calculated using the CKD-EPI Creatinine Equation (2021)    Anion gap 8 5 - 15    Comment: Performed at [facility], [address] [place], [place] [address]  Magnesium     Status: None   Collection Time: 11/15/22  6:14 AM  Result Value Ref Range   Magnesium 2.0 1.7 - 2.4 mg/dL    Comment: Performed at [facility], [address] [place], [place] [address]  Glucose, capillary     Status: Abnormal   Collection Time: 11/15/22  8:18 AM  Result Value Ref Range   Glucose-Capillary 139 (H) 70 - 99 mg/dL    Comment: Glucose reference range applies only to samples taken after fasting for at least 8 hours.  Glucose, capillary     Status: Abnormal   Collection Time: 11/15/22 11:24 AM  Result Value Ref Range   Glucose-Capillary 124 (H) 70 - 99 mg/dL    Comment: Glucose reference range applies only to samples taken after fasting for at least 8 hours.  Glucose, capillary     Status: Abnormal   Collection Time: 11/15/22  5:48 PM  Result Value Ref Range   Glucose-Capillary 169 (H) 70 - 99 mg/dL    Comment: Glucose reference range applies only to samples taken after fasting for at least 8 hours.  Glucose, capillary     Status: Abnormal   Collection Time: 11/15/22   8:40 PM  Result Value Ref Range   Glucose-Capillary 158 (H) 70 - 99 mg/dL    Comment: Glucose reference range applies only to samples taken after fasting for at least 8 hours.  Heparin level (unfractionated)     Status: Abnormal   Collection Time: 11/16/22  5:42 AM  Result Value Ref Range   Heparin Unfractionated >1.10 (H) 0.30 - 0.70 IU/mL    Comment: (NOTE) The clinical reportable range upper limit is being lowered to >1.10 to  align with the FDA approved guidance for the current laboratory assay.  If heparin results are below expected values, and patient dosage has  been confirmed, suggest follow up testing of antithrombin III levels. Performed at [facility], [address] [place], [place] [address]   Basic metabolic panel     Status: Abnormal   Collection Time: 11/16/22  5:42 AM  Result Value Ref Range   Sodium 138 135 - 145 mmol/L   Potassium 3.7 3.5 - 5.1 mmol/L   Chloride 107 98 - 111 mmol/L   CO2 24 22 - 32 mmol/L   Glucose, Bld 146 (H) 70 - 99 mg/dL    Comment: Glucose reference range applies only to samples taken after fasting for at least 8 hours.   BUN 16 6 - 20 mg/dL   Creatinine, Ser 9.14 (H) 0.44 - 1.00 mg/dL   Calcium 8.6 (L) 8.9 - 10.3 mg/dL   GFR, Estimated 54 (L) >60 mL/min    Comment: (NOTE) Calculated using the CKD-EPI Creatinine Equation (2021)    Anion gap 7 5 - 15    Comment: Performed at [facility], [address] [place], [place] [address]  CBC     Status: Abnormal   Collection Time: 11/16/22  5:42 AM  Result Value Ref Range   WBC 9.4 4.0 - 10.5 K/uL   RBC 2.85 (L) 3.87 - 5.11 MIL/uL   Hemoglobin 8.3 (L) 12.0 - 15.0 g/dL   HCT 62.1 (L) 30.8 - 65.7 %   MCV 89.5 80.0 - 100.0 fL   MCH 29.1 26.0 - 34.0 pg   MCHC 32.5 30.0 - 36.0 g/dL   RDW 84.6 96.2 - 95.2 %   Platelets 183 150 - 400 K/uL   nRBC 0.0 0.0 - 0.2 %    Comment: Performed at [facility], [address] [place], [place] [address]  Glucose,  capillary     Status: Abnormal   Collection Time: 11/16/22  9:15 AM  Result Value Ref Range   Glucose-Capillary 205 (H) 70 - 99 mg/dL    Comment: Glucose reference range applies only to samples taken after fasting for at least 8 hours.  Glucose, capillary     Status: Abnormal   Collection Time: 11/16/22 11:50 AM  Result Value Ref Range   Glucose-Capillary 152 (H) 70 - 99 mg/dL    Comment: Glucose reference range applies only to samples taken after fasting for at least 8 hours.  Hemoglobin and hematocrit, blood     Status: Abnormal   Collection Time: 11/16/22 12:53 PM  Result Value Ref Range   Hemoglobin 8.9 (L) 12.0 - 15.0 g/dL   HCT 44.0 (L) 10.2 - 72.5 %    Comment: Performed at [facility], [address], [place], [place] [address]    Radiology VAS [place] LOWER EXTREMITY VENOUS (DVT)  Result Date: 01/07/2023  Lower Venous DVT Study Patient Name:  NATASSHA COKER  Date of Exam:   01/07/2023 Medical Rec #: [id number]         Accession #:    [id number] Date of Birth: 16-Apr-1971          Patient Gender: F Patient Age:   13 years Exam Location:  [facility] Procedure:      VAS [place] LOWER EXTREMITY VENOUS (DVT) Referring Phys: Festus Barren --------------------------------------------------------------------------------  Indications: F/U Lt LE DVT.  Risk Factors: DVT 11/12/2022: Lt LE DVT Surgery 11/13/2022: Pulmonary Thrombectomy/IVC Inserted. 11/15/2022: Thrombectomy Lt LE. Performing  Technologist: Debbe Bales RVS  Examination Guidelines: A complete evaluation includes B-mode imaging, spectral Doppler, color Doppler, and power Doppler as needed of all accessible portions of each vessel. Bilateral testing is considered an integral part of a complete examination. Limited examinations for reoccurring indications may be performed as noted. The reflux portion of the exam is performed with the patient in reverse Trendelenburg.   +-----+---------------+---------+-----------+----------+--------------+ RIGHTCompressibilityPhasicitySpontaneityPropertiesThrombus Aging +-----+---------------+---------+-----------+----------+--------------+ CFV  Full           Yes      Yes                                 +-----+---------------+---------+-----------+----------+--------------+   +---------+---------------+---------+-----------+----------+--------------+ LEFT     CompressibilityPhasicitySpontaneityPropertiesThrombus Aging +---------+---------------+---------+-----------+----------+--------------+ CFV      Full           Yes      Yes                                 +---------+---------------+---------+-----------+----------+--------------+ SFJ      Full           Yes      Yes                                 +---------+---------------+---------+-----------+----------+--------------+ FV Prox  Full           Yes      Yes                                 +---------+---------------+---------+-----------+----------+--------------+ FV Mid   Full           Yes      Yes                                 +---------+---------------+---------+-----------+----------+--------------+ FV DistalFull           Yes      Yes                                 +---------+---------------+---------+-----------+----------+--------------+ PFV      Full           Yes      Yes                                 +---------+---------------+---------+-----------+----------+--------------+ POP      Full           Yes      Yes                                 +---------+---------------+---------+-----------+----------+--------------+ PTV      Full           Yes      Yes                                 +---------+---------------+---------+-----------+----------+--------------+ PERO     Full           Yes  Yes                                  +---------+---------------+---------+-----------+----------+--------------+ GSV      Full           Yes      Yes                                 +---------+---------------+---------+-----------+----------+--------------+ SSV      Full           Yes      Yes                                 +---------+---------------+---------+-----------+----------+--------------+    Summary: RIGHT: - No evidence of common femoral vein obstruction.  LEFT: - No evidence of deep vein thrombosis in the lower extremity. No indirect evidence of obstruction proximal to the inguinal ligament. - Findings suggest resolution of previously noted thrombus. - There is no evidence of deep vein thrombosis in the lower extremity. - There is no evidence of deep vein thrombosis proximal to the inguinal ligament or in the common femoral vein. - There is no evidence of deep vein thrombosis in the lower extremity. However, portions of this examination were limited- see technologist comments above. - There is no evidence of chronic venous insufficiency. - There is no evidence of superficial venous thrombosis.  *See table(s) above for measurements and observations. Electronically signed by Festus Barren MD on 01/07/2023 at 3:51:23 PM.    Final     Assessment/Plan  Acute deep vein thrombosis (DVT) of left lower extremity (HCC) Resolved with anticoagulation and thrombectomy  Acute pulmonary embolism (HCC) Had pulmonary thrombectomy.  On anticoagulation.  Markedly improved.  Essential hypertension blood pressure control important in reducing the progression of atherosclerotic disease. On appropriate oral medications.   Stage 3a chronic kidney disease (HCC) Hydrate and limit contrast with procedures  Presence of IVC filter The patient has been on anticoagulation for 2 months and has no significant residual lower extremity DVT.  Her filter can now be safely removed with essentially no risk of thromboembolic complications in the  short-term.  I would generally recommend anticoagulation for 1 year after extensive pulmonary embolus and DVT.    Festus Barren, MD  01/07/2023 5:02 PM    This note was created with Dragon medical transcription system.  Any errors from dictation are purely unintentional

## 2023-01-09 ENCOUNTER — Telehealth (INDEPENDENT_AMBULATORY_CARE_PROVIDER_SITE_OTHER): Payer: Self-pay

## 2023-01-09 NOTE — Telephone Encounter (Signed)
Spoke with the patient and she is scheduled with Dr. Wyn Quaker for a IVC filter removal on 01/30/23 with a 11:00 am arrival time to the [facility] Pre-procedure instructions were discussed and will be mailed. Patient was offered 01/13/23 and declined and wanted to go a couple of weeks out instead.

## 2023-01-30 ENCOUNTER — Encounter
Admission: RE | Disposition: A | Discharge status: Home / Self Care | Payer: Self-pay | Source: Home / Self Care | Attending: Vascular Surgery

## 2023-01-30 ENCOUNTER — Encounter: Payer: Self-pay | Admitting: Vascular Surgery

## 2023-01-30 ENCOUNTER — Other Ambulatory Visit: Payer: Self-pay

## 2023-01-30 ENCOUNTER — Ambulatory Visit
Admission: RE | Admit: 2023-01-30 | Discharge: 2023-01-30 | Disposition: A | Discharge status: Home / Self Care | Payer: BC Managed Care – PPO | Attending: Vascular Surgery | Admitting: Vascular Surgery

## 2023-01-30 DIAGNOSIS — E1122 Type 2 diabetes mellitus with diabetic chronic kidney disease: Secondary | ICD-10-CM | POA: Diagnosis not present

## 2023-01-30 DIAGNOSIS — N1831 Chronic kidney disease, stage 3a: Secondary | ICD-10-CM | POA: Insufficient documentation

## 2023-01-30 DIAGNOSIS — Z7901 Long term (current) use of anticoagulants: Secondary | ICD-10-CM | POA: Diagnosis not present

## 2023-01-30 DIAGNOSIS — Z4589 Encounter for adjustment and management of other implanted devices: Secondary | ICD-10-CM | POA: Diagnosis present

## 2023-01-30 DIAGNOSIS — I129 Hypertensive chronic kidney disease with stage 1 through stage 4 chronic kidney disease, or unspecified chronic kidney disease: Secondary | ICD-10-CM | POA: Diagnosis not present

## 2023-01-30 DIAGNOSIS — Z86718 Personal history of other venous thrombosis and embolism: Secondary | ICD-10-CM | POA: Insufficient documentation

## 2023-01-30 DIAGNOSIS — I82409 Acute embolism and thrombosis of unspecified deep veins of unspecified lower extremity: Secondary | ICD-10-CM

## 2023-01-30 HISTORY — PX: IVC FILTER REMOVAL: CATH118246

## 2023-01-30 LAB — GLUCOSE, CAPILLARY
Glucose-Capillary: 118 mg/dL — ABNORMAL HIGH (ref 70–99)
Glucose-Capillary: 139 mg/dL — ABNORMAL HIGH (ref 70–99)

## 2023-01-30 SURGERY — IVC FILTER REMOVAL
Anesthesia: Moderate Sedation

## 2023-01-30 MED ORDER — MIDAZOLAM HCL 2 MG/2ML IJ SOLN
INTRAMUSCULAR | Status: DC | PRN
  Administered 2023-01-30 (×2): 1 mg via INTRAVENOUS

## 2023-01-30 MED ORDER — FENTANYL CITRATE (PF) 100 MCG/2ML IJ SOLN
INTRAMUSCULAR | Status: AC
  Filled 2023-01-30: qty 2

## 2023-01-30 MED ORDER — MIDAZOLAM HCL 2 MG/ML PO SYRP: 8.0000 mg | ORAL_SOLUTION | Freq: Once | ORAL | Status: DC | PRN

## 2023-01-30 MED ORDER — FENTANYL CITRATE (PF) 100 MCG/2ML IJ SOLN
INTRAMUSCULAR | Status: DC | PRN
  Administered 2023-01-30: 50 ug via INTRAVENOUS
  Administered 2023-01-30: 25 ug via INTRAVENOUS

## 2023-01-30 MED ORDER — METHYLPREDNISOLONE SODIUM SUCC 125 MG IJ SOLR: 125.0000 mg | Freq: Once | INTRAMUSCULAR | Status: DC | PRN

## 2023-01-30 MED ORDER — DIPHENHYDRAMINE HCL 50 MG/ML IJ SOLN: 50.0000 mg | Freq: Once | INTRAMUSCULAR | Status: DC | PRN

## 2023-01-30 MED ORDER — SODIUM CHLORIDE 0.9 % IV SOLN: INTRAVENOUS | Status: DC

## 2023-01-30 MED ORDER — HYDROMORPHONE HCL 1 MG/ML IJ SOLN: 1.0000 mg | Freq: Once | INTRAMUSCULAR | Status: DC | PRN

## 2023-01-30 MED ORDER — VANCOMYCIN HCL IN DEXTROSE 1-5 GM/200ML-% IV SOLN
1000.0000 mg | Freq: Once | INTRAVENOUS | Status: AC
  Administered 2023-01-30: 1000 mg via INTRAVENOUS

## 2023-01-30 MED ORDER — LIDOCAINE-EPINEPHRINE (PF) 1 %-1:200000 IJ SOLN
INTRAMUSCULAR | Status: DC | PRN
  Administered 2023-01-30: 10 mL

## 2023-01-30 MED ORDER — CEFAZOLIN SODIUM-DEXTROSE 2-4 GM/100ML-% IV SOLN: 2.0000 g | INTRAVENOUS | Status: DC

## 2023-01-30 MED ORDER — HEPARIN (PORCINE) IN NACL 2000-0.9 UNIT/L-% IV SOLN
INTRAVENOUS | Status: DC | PRN
  Administered 2023-01-30: 1000 mL

## 2023-01-30 MED ORDER — VANCOMYCIN HCL IN DEXTROSE 1-5 GM/200ML-% IV SOLN
INTRAVENOUS | Status: AC
  Filled 2023-01-30: qty 200

## 2023-01-30 MED ORDER — ONDANSETRON HCL 4 MG/2ML IJ SOLN: 4.0000 mg | Freq: Four times a day (QID) | INTRAMUSCULAR | Status: DC | PRN

## 2023-01-30 MED ORDER — IODIXANOL 320 MG/ML IV SOLN
INTRAVENOUS | Status: DC | PRN
  Administered 2023-01-30: 20 mL

## 2023-01-30 MED ORDER — MIDAZOLAM HCL 5 MG/5ML IJ SOLN
INTRAMUSCULAR | Status: AC
  Filled 2023-01-30: qty 5

## 2023-01-30 MED ORDER — FAMOTIDINE 20 MG PO TABS: 40.0000 mg | ORAL_TABLET | Freq: Once | ORAL | Status: DC | PRN

## 2023-01-30 SURGICAL SUPPLY — 4 items
COVER PROBE ULTRASOUND 5X96 (MISCELLANEOUS) IMPLANT
KIT SNARE RETRIEVAL 3-LOOP (MISCELLANEOUS) IMPLANT
PACK ANGIOGRAPHY (CUSTOM PROCEDURE TRAY) ×1 IMPLANT
WIRE GUIDERIGHT .035X150 (WIRE) IMPLANT

## 2023-01-30 NOTE — Op Note (Signed)
[facility]   OPERATIVE NOTE    PRE-OPERATIVE DIAGNOSIS:  1. DVT 2. status post IVC filter placement  POST-OPERATIVE DIAGNOSIS: Same as above  PROCEDURE: 1.   Ultrasound guidance for vascular access right jugular vein 2.   Catheter placement into inferior vena cava from right jugular vein 3.   Inferior venacavogram 4.   Retrieval of Option Elite IVC filter  SURGEON: Festus Barren, MD  ASSISTANT(S): None  ANESTHESIA: Local with moderate conscious sedation for approximately 25 minutes using 2 mg of Versed and 75 mcg of Fentanyl  ESTIMATED BLOOD LOSS: 25 cc  CONTRAST:  15 cc  FLUORO TIME:  15.4 minutes  FINDING(S): 1.  patent IVC.  Filter hook was difficult to capture, but eventually was retrieved.   SPECIMEN(S):  IVC filter  INDICATIONS:    [name] who presents with a previous history of IVC filter placement. Patient has tolerated anticoagulation and no longer needs this filter. The patient remains on anticoagulation. Risks and benefits were discussed, and informed consent was obtained.  DESCRIPTION: After obtaining full informed written consent, the patient was brought back to the vascular suite and placed supine upon the table. Moderate conscious sedation was administered during a face to face encounter with the patient throughout the procedure with my supervision of the RN administering medicines and monitoring the patient's vital signs, pulse oximetry, telemetry and mental status throughout from the start of the procedure until the patient was taken to the recovery room.  After obtaining adequate anesthesia, the patient was prepped and draped in the standard fashion.  The right jugular vein was visualized with ultrasound and found to be widely patent. It was then accessed under direct ultrasound guidance without difficulty with the Seldinger needle and a permanent image was recorded. A J-wire was placed. After skin nick and dilatation,  the retrieval sheath was placed over the wire and advanced into the inferior vena cava. Inferior vena cava was imaged and found to be widely patent on inferior venacavogram. The filter was fairly straight in its orientation. The retrieval snare was then placed through the sheath and the hook of the filter was snared. This was tedious and we used different angles and two contrast injections to help snare this. . The sheath was then advanced, and the filter was collapsed and brought into the sheath in its entirety. It was then removed from the body in its entirety. The retrieval sheath was then removed. Pressure was held at the access site and sterile dressing was placed. The patient was taken to the recovery room in stable condition having tolerated the procedure well.  COMPLICATIONS: None  CONDITION:  Stable   Festus Barren 01/30/2023 2:19 PM  This note was created with Dragon Medical transcription system. Any errors in dictation are purely unintentional.

## 2023-01-30 NOTE — Interval H&P Note (Signed)
History and Physical Interval Note:  01/30/2023 11:18 AM  Cynthia Mccarthy  has presented today for surgery, with the diagnosis of IVC filter removal  Acute DVT.  The various methods of treatment have been discussed with the patient and family. After consideration of risks, benefits and other options for treatment, the patient has consented to  Procedure(s): IVC FILTER REMOVAL (N/A) as a surgical intervention.  The patient's history has been reviewed, patient examined, no change in status, stable for surgery.  I have reviewed the patient's chart and labs.  Questions were answered to the patient's satisfaction.     Festus Barren

## 2023-01-31 ENCOUNTER — Encounter: Payer: Self-pay | Admitting: Vascular Surgery

## 2023-02-27 ENCOUNTER — Ambulatory Visit (INDEPENDENT_AMBULATORY_CARE_PROVIDER_SITE_OTHER): Payer: BC Managed Care – PPO | Admitting: Nurse Practitioner

## 2023-03-12 ENCOUNTER — Ambulatory Visit (INDEPENDENT_AMBULATORY_CARE_PROVIDER_SITE_OTHER): Payer: BC Managed Care – PPO | Admitting: Nurse Practitioner

## 2023-03-12 ENCOUNTER — Encounter (INDEPENDENT_AMBULATORY_CARE_PROVIDER_SITE_OTHER): Payer: Self-pay | Admitting: Nurse Practitioner

## 2023-03-12 VITALS — BP 128/81 | HR 81 | Resp 16 | Wt 197.2 lb

## 2023-03-12 DIAGNOSIS — I10 Essential (primary) hypertension: Secondary | ICD-10-CM

## 2023-03-12 DIAGNOSIS — Z95828 Presence of other vascular implants and grafts: Secondary | ICD-10-CM | POA: Diagnosis not present

## 2023-03-12 DIAGNOSIS — I2699 Other pulmonary embolism without acute cor pulmonale: Secondary | ICD-10-CM

## 2023-03-16 ENCOUNTER — Other Ambulatory Visit (INDEPENDENT_AMBULATORY_CARE_PROVIDER_SITE_OTHER): Payer: Self-pay | Admitting: Vascular Surgery

## 2023-03-17 ENCOUNTER — Encounter (INDEPENDENT_AMBULATORY_CARE_PROVIDER_SITE_OTHER): Payer: Self-pay | Admitting: Nurse Practitioner

## 2023-03-17 NOTE — Progress Notes (Signed)
Subjective:    Patient ID: Cynthia Mccarthy, female    DOB: [birth year], 52 y.o.   MRN: [id number] Chief Complaint  Patient presents with   Follow-up    [facility] 4 week ivc filter removal    The patient presents to the office for follow up s/p IVC filter removal on 01/30/2023.  The patient had her IVC filter removed post pulmonary embolism and DVT.   She notes that her breathing is much better with very minimal edema or postphlebitic symptoms      No SOB or pleuritic chest pains.  No cough or hemoptysis.   No blood per rectum or blood in any sputum.  No excessive bruising per the patient.   No recent shortening of the patient's walking distance or new symptoms consistent with claudication.  No history of rest pain symptoms. No new ulcers or wounds of the lower extremities have occurred.    Review of Systems  All other systems reviewed and are negative.      Objective:   Physical Exam Vitals reviewed.  HENT:     Head: Normocephalic.  Cardiovascular:     Rate and Rhythm: Normal rate.     Pulses: Normal pulses.  Pulmonary:     Effort: Pulmonary effort is normal.  Skin:    General: Skin is warm and dry.  Neurological:     Mental Status: She is alert and oriented to person, place, and time.  Psychiatric:        Mood and Affect: Mood normal.        Behavior: Behavior normal.        Thought Content: Thought content normal.        Judgment: Judgment normal.     BP 128/81 (BP Location: Left Arm)   Pulse 81   Resp 16   Wt 197 lb 3.2 oz (89.4 kg)   BMI 36.07 kg/m   Past Medical History:  Diagnosis Date   Anxiety    Cervical cancer (HCC)    Depression    Diabetes mellitus without complication (HCC)    DVT (deep venous thrombosis) (HCC)    Hormone disorder    Hypertension    Hypokalemia 11/13/2022   Hypomagnesemia 11/14/2022   Hypothyroidism     Social History   Socioeconomic History   Marital status: Married    Spouse name: Not on file   Number of  children: Not on file   Years of education: Not on file   Highest education level: Not on file  Occupational History   Not on file  Tobacco Use   Smoking status: Never    Passive exposure: Never   Smokeless tobacco: Never  Vaping Use   Vaping status: Never Used  Substance and Sexual Activity   Alcohol use: Yes   Drug use: No   Sexual activity: Yes  Other Topics Concern   Not on file  Social History Narrative   Not on file   Social Determinants of Health   Financial Resource Strain: Patient Declined (11/06/2022)   Received from [facility], [facility]   Overall Financial Resource Strain (CARDIA)    Difficulty of Paying Living Expenses: Patient declined  Food Insecurity: No Food Insecurity (11/12/2022)   Hunger Vital Sign    Worried About Running Out of Food in the Last Year: Never true    Ran Out of Food in the Last Year: Never true  Transportation Needs: No Transportation Needs (11/12/2022)   PRAPARE -  Administrator, Civil Service (Medical): No    Lack of Transportation (Non-Medical): No  Physical Activity: Insufficiently Active (08/24/2018)   Received from [facility], [facility]   Exercise Vital Sign    Days of Exercise per Week: 3 days    Minutes of Exercise per Session: 30 min  Stress: Stress Concern Present (08/24/2018)   Received from [facility], [facility]   [employer] of Occupational Health - Occupational Stress Questionnaire    Feeling of Stress : Rather much  Social Connections: Unknown (01/23/2018)   Received from [facility], [facility]   Social Connection and Isolation Panel [NHANES]    Frequency of Communication with Friends and Family: Patient declined    Frequency of Social Gatherings with Friends and Family: Patient declined    Attends Religious Services: Patient declined    Active Member of  Clubs or Organizations: Patient declined    Attends Banker Meetings: Patient declined    Marital Status: Patient declined  Intimate Partner Violence: Not At Risk (11/12/2022)   Humiliation, Afraid, Rape, and Kick questionnaire    Fear of Current or Ex-Partner: No    Emotionally Abused: No    Physically Abused: No    Sexually Abused: No    Past Surgical History:  Procedure Laterality Date   ABDOMINAL HYSTERECTOMY     IVC FILTER INSERTION N/A 11/13/2022   Procedure: IVC FILTER INSERTION;  Surgeon: Annice Needy, MD;  Location: [facility] INVASIVE CV LAB;  Service: Cardiovascular;  Laterality: N/A;   IVC FILTER REMOVAL N/A 01/30/2023   Procedure: IVC FILTER REMOVAL;  Surgeon: Annice Needy, MD;  Location: [facility] INVASIVE CV LAB;  Service: Cardiovascular;  Laterality: N/A;   PERIPHERAL VASCULAR THROMBECTOMY Left 11/15/2022   Procedure: PERIPHERAL VASCULAR THROMBECTOMY;  Surgeon: Annice Needy, MD;  Location: [facility] INVASIVE CV LAB;  Service: Cardiovascular;  Laterality: Left;   PULMONARY THROMBECTOMY Bilateral 11/13/2022   Procedure: PULMONARY THROMBECTOMY;  Surgeon: Annice Needy, MD;  Location: [facility] INVASIVE CV LAB;  Service: Cardiovascular;  Laterality: Bilateral;    Family History  Problem Relation Age of Onset   Bladder Cancer Neg Hx    Kidney cancer Neg Hx     Allergies  Allergen Reactions   Penicillins Rash    Has patient had a PCN reaction causing immediate rash, facial/tongue/throat swelling, SOB or lightheadedness with hypotension: Yes Has patient had a PCN reaction causing severe rash involving mucus membranes or skin necrosis: No Has patient had a PCN reaction that required hospitalization: No Has patient had a PCN reaction occurring within the last 10 years: Yes If all of the above answers are "NO", then may proceed with Cephalosporin use.   Melatonin     Other reaction(s): Other (See Comments), Other (See Comments) Pt. States have nightmares Pt. States have nightmares     Sulfa Antibiotics Hives and Rash       Latest Ref Rng & Units 11/16/2022   12:53 PM 11/16/2022    5:42 AM 11/15/2022    6:14 AM  CBC  WBC 4.0 - 10.5 K/uL  9.4  8.9   Hemoglobin 12.0 - 15.0 g/dL 8.9  8.3  40.9   Hematocrit 36.0 - 46.0 % 27.2  25.5  32.5   Platelets 150 - 400 K/uL  183  205       CMP     Component Value Date/Time   NA 138  11/16/2022 0542   K 3.7 11/16/2022 0542   CL 107 11/16/2022 0542   CO2 24 11/16/2022 0542   GLUCOSE 146 (H) 11/16/2022 0542   BUN 16 11/16/2022 0542   CREATININE 1.21 (H) 11/16/2022 0542   CALCIUM 8.6 (L) 11/16/2022 0542   PROT 6.5 11/13/2022 0158   ALBUMIN 3.7 11/13/2022 0158   AST 14 (L) 11/13/2022 0158   ALT 22 11/13/2022 0158   ALKPHOS 38 11/13/2022 0158   BILITOT 0.6 11/13/2022 0158   GFRNONAA 54 (L) 11/16/2022 0542     No results found.     Assessment & Plan:   1. Other acute pulmonary embolism without acute cor pulmonale (HCC) With pulmonary embolism.  Recommend anticoagulation for 1 year.  Patient will follow-up once she has approximately a year out from her initial diagnosis to discuss options for anticoagulation, including staying on full dose Eliquis, prophylactic dose or aspirin  2. Presence of IVC filter IVC filter removed without incident.  Wound is well-healed.  3. Essential hypertension Continue antihypertensive medications as already ordered, these medications have been reviewed and there are no changes at this time.   Current Outpatient Medications on File Prior to Visit  Medication Sig Dispense Refill   apixaban (ELIQUIS) 5 MG TABS tablet Take 1 tablet (5 mg total) by mouth 2 (two) times daily. 60 tablet 2   busPIRone (BUSPAR) 10 MG tablet Take 10 mg by mouth 2 (two) times daily.     cariprazine (VRAYLAR) 1.5 MG capsule Take 1.5 mg by mouth daily.     citalopram (CELEXA) 40 MG tablet Take 40 mg by mouth daily.     famotidine (PEPCID) 40 MG tablet Take 40 mg by mouth daily.     levothyroxine (SYNTHROID) 100  MCG tablet Take 100 mcg by mouth daily before breakfast.     metFORMIN (GLUCOPHAGE) 500 MG tablet Take 500 mg by mouth daily.     rosuvastatin (CRESTOR) 5 MG tablet Take 5 mg by mouth daily.     SYMBICORT 160-4.5 MCG/ACT inhaler Inhale into the lungs.     traZODone (DESYREL) 100 MG tablet Take 100 mg by mouth at bedtime. Take 100 mg by mouth nightly Take 2 tabs if needed     vitamin B-12 (CYANOCOBALAMIN) 1000 MCG tablet Take by mouth.     Catheters (INTERMITTENT 14FR/40CM) MISC Use for urinary self-catheterization 3 times a day as directed (Patient not taking: Reported on 01/30/2023)     No current facility-administered medications on file prior to visit.    There are no Patient Instructions on file for this visit. No follow-ups on file.   Georgiana Spinner, NP

## 2023-05-01 ENCOUNTER — Other Ambulatory Visit: Payer: Self-pay | Admitting: Nurse Practitioner

## 2023-05-01 DIAGNOSIS — Z1231 Encounter for screening mammogram for malignant neoplasm of breast: Secondary | ICD-10-CM

## 2023-08-09 IMAGING — CR DG ABDOMEN 2V
1 series · 3 of 3 positions shown · non-contrast
Comparison: 06/01/2017 CT abdomen/pelvis

CLINICAL DATA: R flank pain x 2 weeks, concern for nephrolithiasis

EXAM:
ABDOMEN - 2 VIEW

[Series 1: dg abd 2 views · 0.14mm/px · 3 of 3 slices shown]
[im 1/3]
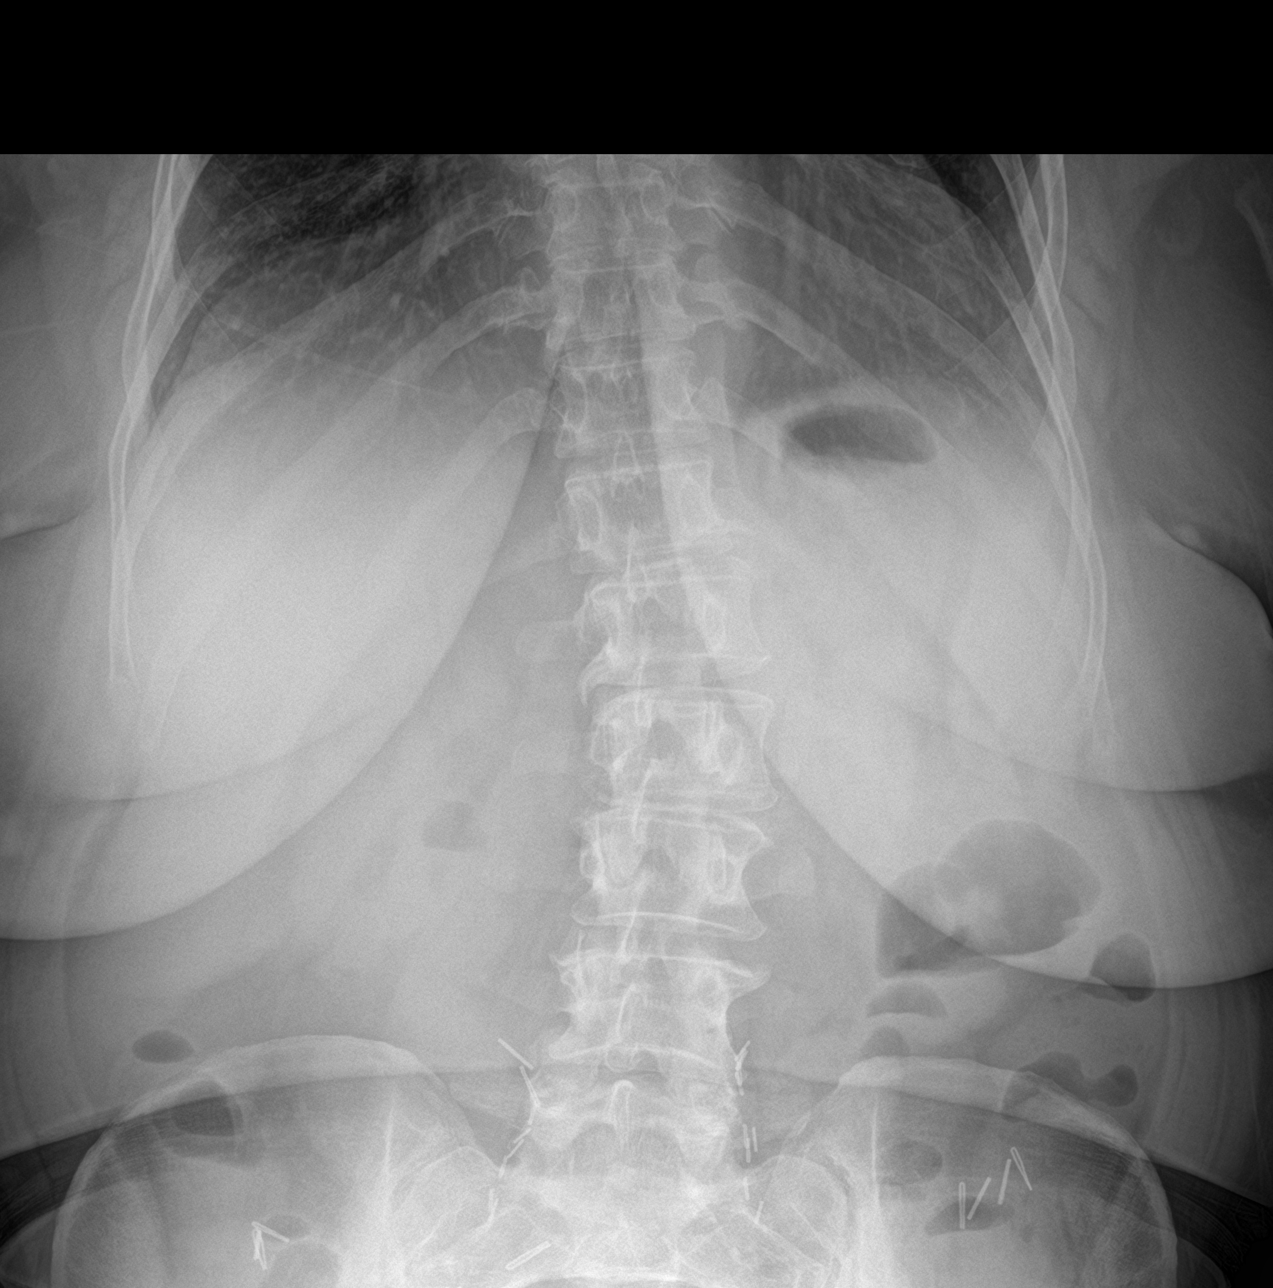
[im 2/3]
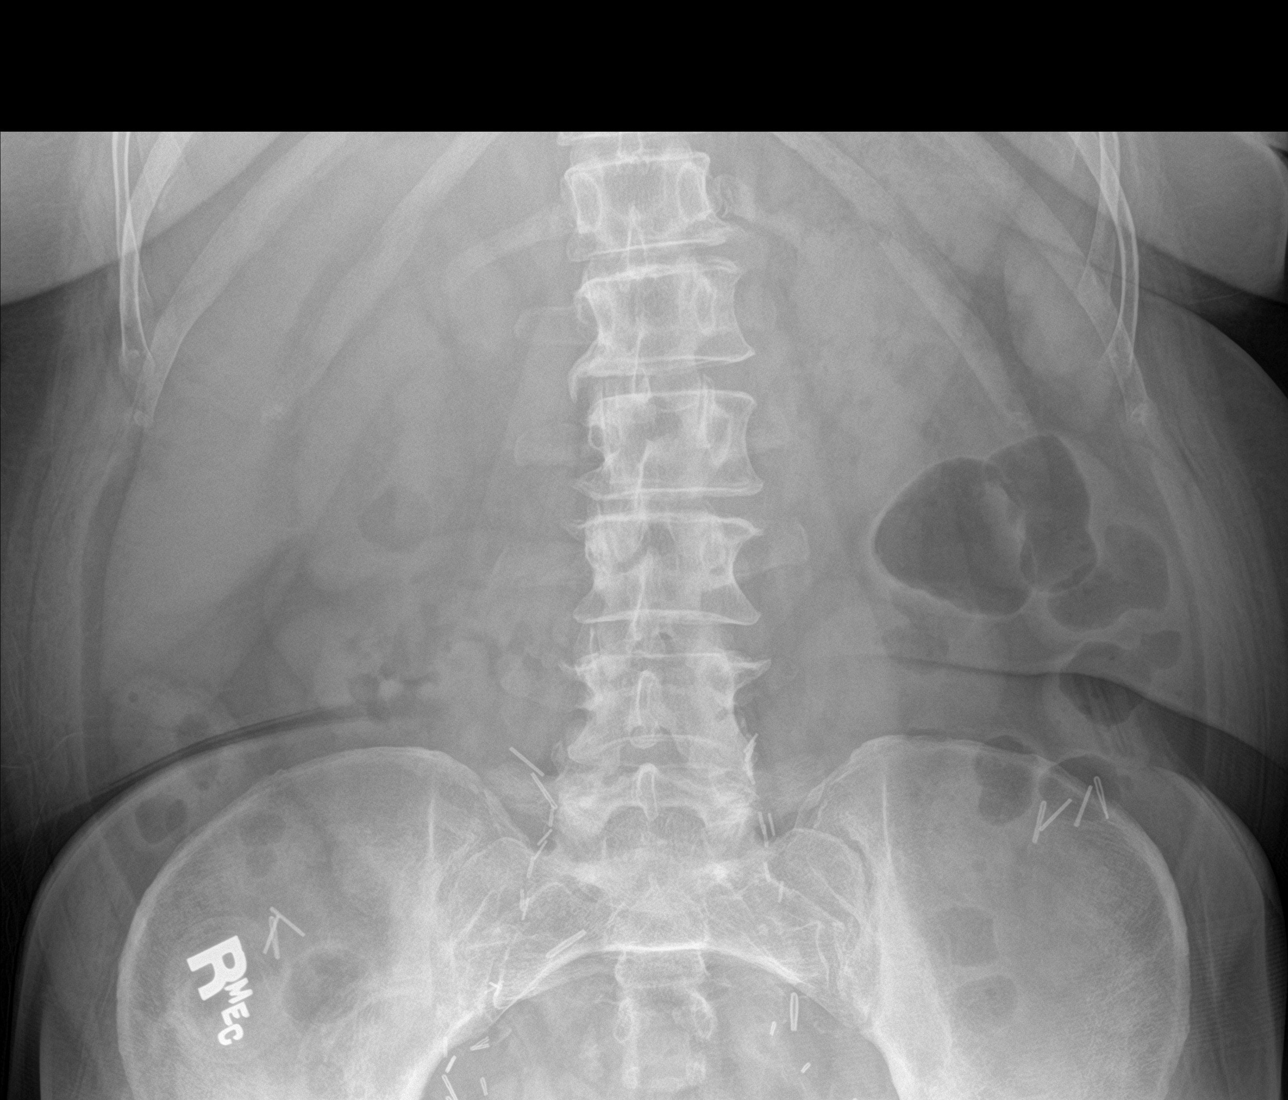
[im 3/3]
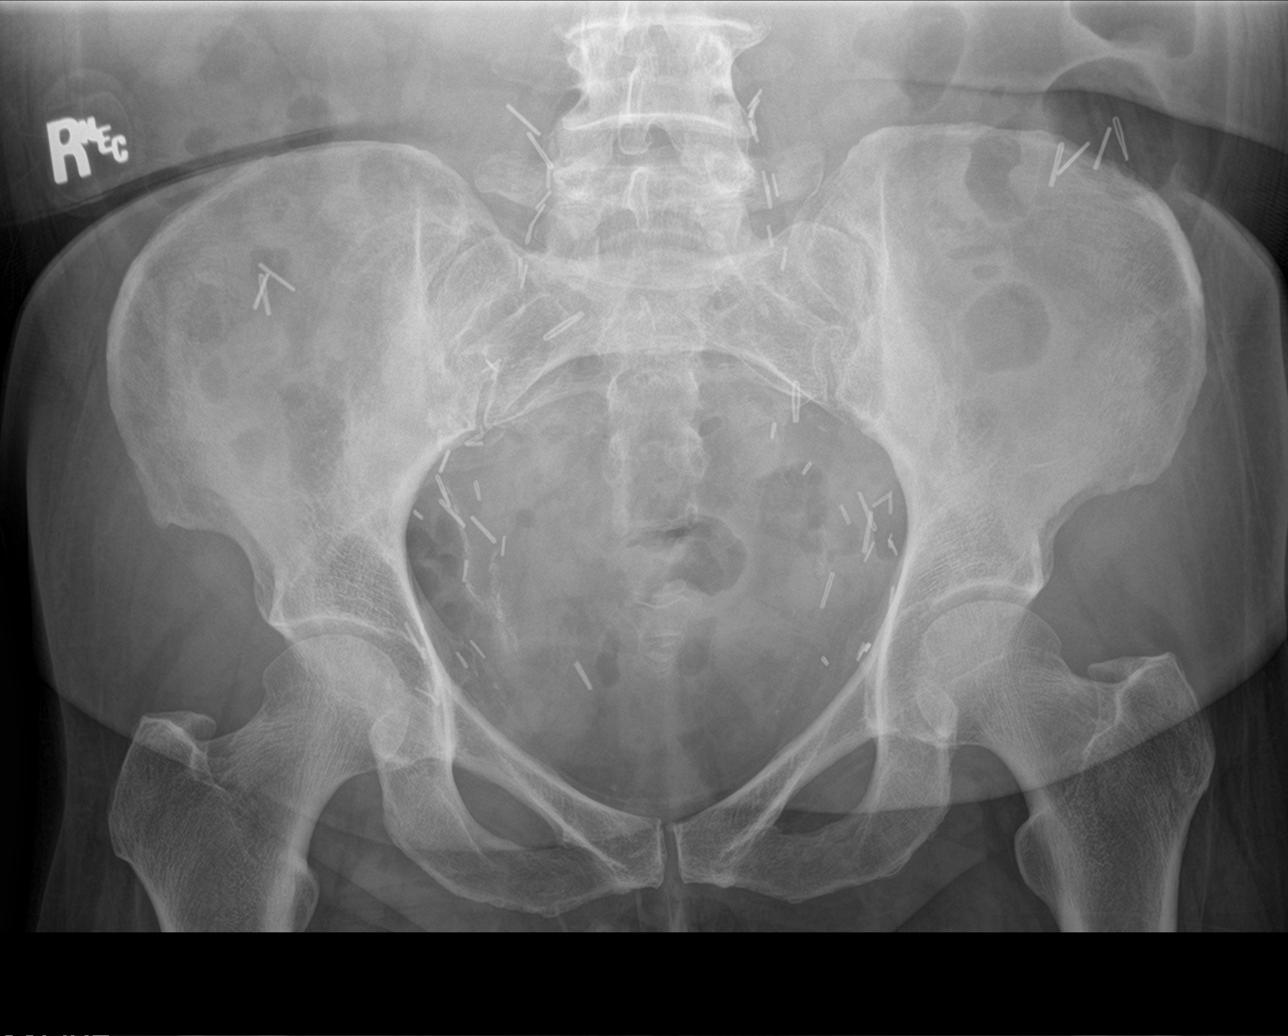

[3 of 3 positions shown; findings below may reference images not displayed]

FINDINGS: Numerous surgical clips overlie the lower quadrants and bilateral
deep pelvis. No radiopaque stones overlie the kidneys or expected
locations of the ureters or bladder. No dilated small bowel loops.
Mild colonic stool and gas. No evidence of pneumatosis or
pneumoperitoneum. Clear lung bases. Mild levocurvature of the lumbar
spine. Mild-to-moderate lumbar spondylosis.
IMPRESSION: No radiopaque nephrolithiasis.  Nonobstructive bowel gas pattern.

## 2023-09-13 ENCOUNTER — Other Ambulatory Visit (INDEPENDENT_AMBULATORY_CARE_PROVIDER_SITE_OTHER): Payer: Self-pay | Admitting: Vascular Surgery

## 2023-11-04 ENCOUNTER — Ambulatory Visit (INDEPENDENT_AMBULATORY_CARE_PROVIDER_SITE_OTHER): Payer: BC Managed Care – PPO | Admitting: Nurse Practitioner

## 2023-12-04 ENCOUNTER — Ambulatory Visit (INDEPENDENT_AMBULATORY_CARE_PROVIDER_SITE_OTHER): Admitting: Nurse Practitioner

## 2023-12-04 ENCOUNTER — Encounter (INDEPENDENT_AMBULATORY_CARE_PROVIDER_SITE_OTHER): Payer: Self-pay | Admitting: Nurse Practitioner

## 2023-12-04 VITALS — BP 126/86 | HR 77 | Ht 62.0 in | Wt 182.0 lb

## 2023-12-04 DIAGNOSIS — I2699 Other pulmonary embolism without acute cor pulmonale: Secondary | ICD-10-CM | POA: Diagnosis not present

## 2023-12-04 DIAGNOSIS — I10 Essential (primary) hypertension: Secondary | ICD-10-CM | POA: Diagnosis not present

## 2023-12-04 DIAGNOSIS — M25569 Pain in unspecified knee: Secondary | ICD-10-CM | POA: Diagnosis not present

## 2023-12-08 NOTE — Progress Notes (Signed)
 Subjective:    Patient ID: Cynthia Mccarthy, female    DOB: March 15, 1971, 53 y.o.   MRN: [id number] No chief complaint on file.   Patient presents today to discuss anticoagulation.  She was most recently placed back on anticoagulation in June 2020 for following a pulmonary embolism and DVT.  She notes that this was not her initial pulmonary embolism or DVT issues have multiple over the years.  Some were felt to be provoked but there are certainly some that may be considered unprovoked.  Currently she denies any significant postphlebitic pain or symptoms.  She denies any chest pain or shortness of breath.  She recently had her IVC filter removed and had no significant issues or complications following this.    Review of Systems  Cardiovascular:  Negative for leg swelling.  All other systems reviewed and are negative.      Objective:   Physical Exam Vitals reviewed.  HENT:     Head: Normocephalic.  Cardiovascular:     Rate and Rhythm: Normal rate.     Pulses: Normal pulses.  Pulmonary:     Effort: Pulmonary effort is normal.  Skin:    General: Skin is warm and dry.  Neurological:     Mental Status: She is alert and oriented to person, place, and time.  Psychiatric:        Mood and Affect: Mood normal.        Behavior: Behavior normal.        Thought Content: Thought content normal.        Judgment: Judgment normal.     BP 126/86   Pulse 77   Ht 5' 2 (1.575 m)   Wt 182 lb (82.6 kg)   BMI 33.29 kg/m   Past Medical History:  Diagnosis Date   Anxiety    Cervical cancer (HCC)    Depression    Diabetes mellitus without complication (HCC)    DVT (deep venous thrombosis) (HCC)    Hormone disorder    Hypertension    Hypokalemia 11/13/2022   Hypomagnesemia 11/14/2022   Hypothyroidism     Social History   Socioeconomic History   Marital status: Married    Spouse name: Not on file   Number of children: Not on file   Years of education: Not on file   Highest  education level: Not on file  Occupational History   Not on file  Tobacco Use   Smoking status: Never    Passive exposure: Never   Smokeless tobacco: Never  Vaping Use   Vaping status: Never Used  Substance and Sexual Activity   Alcohol use: Yes   Drug use: No   Sexual activity: Yes  Other Topics Concern   Not on file  Social History Narrative   Not on file   Social Drivers of Health   Financial Resource Strain: Low Risk  (09/01/2023)   Received from [facility]   Overall Financial Resource Strain (CARDIA)    Difficulty of Paying Living Expenses: Not very hard  Food Insecurity: No Food Insecurity (09/01/2023)   Received from [facility]   Hunger Vital Sign    Within the past 12 months, you worried that your food would run out before you got the money to buy more.: Never true    Within the past 12 months, the food you bought just didn't last and you didn't have money to get more.: Never true  Transportation Needs: No Transportation Needs (09/01/2023)  Received from [facility] - Transportation    In the past 12 months, has lack of transportation kept you from medical appointments or from getting medications?: No    Lack of Transportation (Non-Medical): No  Physical Activity: Insufficiently Active (08/24/2018)   Received from [facility]   Exercise Vital Sign    On average, how many days per week do you engage in moderate to strenuous exercise (like a brisk walk)?: 3 days    On average, how many minutes do you engage in exercise at this level?: 30 min  Stress: Stress Concern Present (08/24/2018)   Received from [facility] - Occupational Stress Questionnaire    Feeling of Stress : Rather much  Social Connections: Unknown (01/23/2018)   Received from [facility]   Social Connection and Isolation Panel    Frequency of  Communication with Friends and Family: Patient declined    Frequency of Social Gatherings with Friends and Family: Patient declined    Attends Religious Services: Patient declined    Active Member of Clubs or Organizations: Patient declined    Attends Banker Meetings: Patient declined    Marital Status: Patient declined  Intimate Partner Violence: Not At Risk (11/12/2022)   Humiliation, Afraid, Rape, and Kick questionnaire    Fear of Current or Ex-Partner: No    Emotionally Abused: No    Physically Abused: No    Sexually Abused: No    Past Surgical History:  Procedure Laterality Date   ABDOMINAL HYSTERECTOMY     IVC FILTER INSERTION N/A 11/13/2022   Procedure: IVC FILTER INSERTION;  Surgeon: Marea Selinda RAMAN, MD;  Location: [facility] INVASIVE CV LAB;  Service: Cardiovascular;  Laterality: N/A;   IVC FILTER REMOVAL N/A 01/30/2023   Procedure: IVC FILTER REMOVAL;  Surgeon: Marea Selinda RAMAN, MD;  Location: [facility] INVASIVE CV LAB;  Service: Cardiovascular;  Laterality: N/A;   PERIPHERAL VASCULAR THROMBECTOMY Left 11/15/2022   Procedure: PERIPHERAL VASCULAR THROMBECTOMY;  Surgeon: Marea Selinda RAMAN, MD;  Location: [facility] INVASIVE CV LAB;  Service: Cardiovascular;  Laterality: Left;   PULMONARY THROMBECTOMY Bilateral 11/13/2022   Procedure: PULMONARY THROMBECTOMY;  Surgeon: Marea Selinda RAMAN, MD;  Location: [facility] INVASIVE CV LAB;  Service: Cardiovascular;  Laterality: Bilateral;    Family History  Problem Relation Age of Onset   Bladder Cancer Neg Hx    Kidney cancer Neg Hx     Allergies  Allergen Reactions   Penicillins Rash    Has patient had a PCN reaction causing immediate rash, facial/tongue/throat swelling, SOB or lightheadedness with hypotension: Yes Has patient had a PCN reaction causing severe rash involving mucus membranes or skin necrosis: No Has patient had a PCN reaction that required hospitalization: No Has patient had a PCN reaction occurring within the last 10 years: Yes If all of the  above answers are NO, then may proceed with Cephalosporin use.   Melatonin     Other reaction(s): Other (See Comments), Other (See Comments) Pt. States have nightmares Pt. States have nightmares    Sulfa Antibiotics Hives and Rash       Latest Ref Rng & Units 11/16/2022   12:53 PM 11/16/2022    5:42 AM 11/15/2022    6:14 AM  CBC  WBC 4.0 - 10.5 K/uL  9.4  8.9   Hemoglobin 12.0 - 15.0 g/dL 8.9  8.3  89.4   Hematocrit 36.0 - 46.0 % 27.2  25.5  32.5   Platelets 150 - 400 K/uL  183  205       CMP     Component Value Date/Time   NA 138 11/16/2022 0542   K 3.7 11/16/2022 0542   CL 107 11/16/2022 0542   CO2 24 11/16/2022 0542   GLUCOSE 146 (H) 11/16/2022 0542   BUN 16 11/16/2022 0542   CREATININE 1.21 (H) 11/16/2022 0542   CALCIUM  8.6 (L) 11/16/2022 0542   PROT 6.5 11/13/2022 0158   ALBUMIN  3.7 11/13/2022 0158   AST 14 (L) 11/13/2022 0158   ALT 22 11/13/2022 0158   ALKPHOS 38 11/13/2022 0158   BILITOT 0.6 11/13/2022 0158   GFRNONAA 54 (L) 11/16/2022 0542     No results found.     Assessment & Plan:   1. Other acute pulmonary embolism without acute cor pulmonale (HCC) (Primary) Plan Long discussion with the patient and after reviewing her medical history I do not recommend that she stop her anticoagulation at this time.  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  She also has a familial history.  Based on this.  She is tolerating her Eliquis  without issue I would recommend that she continue she is also advised to utilize medical grade conservative therapy if she does begin to suffer with any postphlebitic symptoms such as swelling or pain or discomfort.  Otherwise we will see the patient annually.  2. Essential hypertension Continue antihypertensive medications as already ordered, these medications have been reviewed and there are no changes at this time.  3. Arthralgia of knee, unspecified laterality The patient notes that she will  likely need to undergo knee surgery at some point.  When that time comes she will need an IVC filter given her history.   Current Outpatient Medications on File Prior to Visit  Medication Sig Dispense Refill   busPIRone  (BUSPAR ) 10 MG tablet Take 10 mg by mouth 2 (two) times daily.     citalopram  (CELEXA ) 40 MG tablet Take 40 mg by mouth daily.     ELIQUIS  5 MG TABS tablet TAKE 1 TABLET BY MOUTH TWICE A DAY 60 tablet 5   famotidine  (PEPCID ) 40 MG tablet Take 40 mg by mouth daily.     levothyroxine  (SYNTHROID ) 100 MCG tablet Take 100 mcg by mouth daily before breakfast.     metFORMIN (GLUCOPHAGE) 500 MG tablet Take 500 mg by mouth daily.     rosuvastatin  (CRESTOR ) 5 MG tablet Take 5 mg by mouth daily.     SYMBICORT 160-4.5 MCG/ACT inhaler Inhale into the lungs.     traZODone  (DESYREL ) 100 MG tablet Take 100 mg by mouth at bedtime. Take 100 mg by mouth nightly Take 2 tabs if needed     vitamin B-12 (CYANOCOBALAMIN) 1000 MCG tablet Take by mouth.     cariprazine (VRAYLAR) 1.5 MG capsule Take 1.5 mg by mouth daily. (Patient not taking: Reported on 12/04/2023)     Catheters (INTERMITTENT 14FR/40CM) MISC Use for urinary self-catheterization 3 times a day as directed (Patient not taking: Reported on 12/04/2023)     No current facility-administered medications on file prior to visit.    There are no Patient Instructions on file for this visit. No follow-ups on file.   Micaiah Litle E Marque Bango, NP

## 2023-12-25 ENCOUNTER — Encounter: Payer: Self-pay | Admitting: *Deleted

## 2024-01-05 ENCOUNTER — Encounter: Admission: RE | Payer: Self-pay | Source: Home / Self Care

## 2024-01-05 ENCOUNTER — Ambulatory Visit: Admission: RE | Admit: 2024-01-05 | Payer: Self-pay | Source: Home / Self Care

## 2024-01-05 HISTORY — DX: Chronic kidney disease, unspecified: N18.9

## 2024-01-05 HISTORY — DX: Other forms of dyspnea: R06.09

## 2024-01-05 HISTORY — DX: Vitamin D deficiency, unspecified: E55.9

## 2024-01-05 HISTORY — DX: Other specified abnormal immunological findings in serum: R76.8

## 2024-01-05 HISTORY — DX: Other specified disorders of the skin and subcutaneous tissue related to radiation: L59.8

## 2024-01-05 HISTORY — DX: Pain in unspecified joint: M25.50

## 2024-01-05 SURGERY — COLONOSCOPY
Anesthesia: General

## 2024-01-08 ENCOUNTER — Other Ambulatory Visit: Payer: Self-pay | Admitting: Nurse Practitioner

## 2024-01-08 DIAGNOSIS — Z1231 Encounter for screening mammogram for malignant neoplasm of breast: Secondary | ICD-10-CM

## 2024-02-23 ENCOUNTER — Encounter
Admission: RE | Disposition: A | Discharge status: Home / Self Care | Payer: Self-pay | Source: Home / Self Care | Attending: Gastroenterology

## 2024-02-23 ENCOUNTER — Ambulatory Visit: Admitting: Certified Registered Nurse Anesthetist

## 2024-02-23 ENCOUNTER — Ambulatory Visit
Admission: RE | Admit: 2024-02-23 | Discharge: 2024-02-23 | Disposition: A | Discharge status: Home / Self Care | Attending: Gastroenterology | Admitting: Gastroenterology

## 2024-02-23 DIAGNOSIS — D123 Benign neoplasm of transverse colon: Secondary | ICD-10-CM | POA: Diagnosis not present

## 2024-02-23 DIAGNOSIS — I129 Hypertensive chronic kidney disease with stage 1 through stage 4 chronic kidney disease, or unspecified chronic kidney disease: Secondary | ICD-10-CM | POA: Diagnosis not present

## 2024-02-23 DIAGNOSIS — Z90710 Acquired absence of both cervix and uterus: Secondary | ICD-10-CM | POA: Insufficient documentation

## 2024-02-23 DIAGNOSIS — K2970 Gastritis, unspecified, without bleeding: Secondary | ICD-10-CM | POA: Insufficient documentation

## 2024-02-23 DIAGNOSIS — E1122 Type 2 diabetes mellitus with diabetic chronic kidney disease: Secondary | ICD-10-CM | POA: Insufficient documentation

## 2024-02-23 DIAGNOSIS — E039 Hypothyroidism, unspecified: Secondary | ICD-10-CM | POA: Insufficient documentation

## 2024-02-23 DIAGNOSIS — Z8541 Personal history of malignant neoplasm of cervix uteri: Secondary | ICD-10-CM | POA: Diagnosis not present

## 2024-02-23 DIAGNOSIS — Z7901 Long term (current) use of anticoagulants: Secondary | ICD-10-CM | POA: Insufficient documentation

## 2024-02-23 DIAGNOSIS — N189 Chronic kidney disease, unspecified: Secondary | ICD-10-CM | POA: Insufficient documentation

## 2024-02-23 DIAGNOSIS — D124 Benign neoplasm of descending colon: Secondary | ICD-10-CM | POA: Insufficient documentation

## 2024-02-23 DIAGNOSIS — Z1211 Encounter for screening for malignant neoplasm of colon: Secondary | ICD-10-CM | POA: Insufficient documentation

## 2024-02-23 DIAGNOSIS — F32A Depression, unspecified: Secondary | ICD-10-CM | POA: Insufficient documentation

## 2024-02-23 DIAGNOSIS — K219 Gastro-esophageal reflux disease without esophagitis: Secondary | ICD-10-CM | POA: Diagnosis not present

## 2024-02-23 DIAGNOSIS — K640 First degree hemorrhoids: Secondary | ICD-10-CM | POA: Insufficient documentation

## 2024-02-23 DIAGNOSIS — Z86718 Personal history of other venous thrombosis and embolism: Secondary | ICD-10-CM | POA: Insufficient documentation

## 2024-02-23 DIAGNOSIS — K56699 Other intestinal obstruction unspecified as to partial versus complete obstruction: Secondary | ICD-10-CM | POA: Insufficient documentation

## 2024-02-23 DIAGNOSIS — R110 Nausea: Secondary | ICD-10-CM | POA: Diagnosis not present

## 2024-02-23 DIAGNOSIS — F419 Anxiety disorder, unspecified: Secondary | ICD-10-CM | POA: Insufficient documentation

## 2024-02-23 DIAGNOSIS — Z7984 Long term (current) use of oral hypoglycemic drugs: Secondary | ICD-10-CM | POA: Insufficient documentation

## 2024-02-23 HISTORY — PX: COLONOSCOPY: SHX5424

## 2024-02-23 HISTORY — PX: POLYPECTOMY: SHX149

## 2024-02-23 HISTORY — PX: ESOPHAGOGASTRODUODENOSCOPY: SHX5428

## 2024-02-23 LAB — GLUCOSE, CAPILLARY: Glucose-Capillary: 137 mg/dL — ABNORMAL HIGH (ref 70–99)

## 2024-02-23 SURGERY — COLONOSCOPY
Anesthesia: General

## 2024-02-23 MED ORDER — PROPOFOL 500 MG/50ML IV EMUL
INTRAVENOUS | Status: DC | PRN
  Administered 2024-02-23: 160 ug/kg/min via INTRAVENOUS

## 2024-02-23 MED ORDER — LIDOCAINE HCL (CARDIAC) PF 100 MG/5ML IV SOSY
PREFILLED_SYRINGE | INTRAVENOUS | Status: DC | PRN
  Administered 2024-02-23: 100 mg via INTRAVENOUS

## 2024-02-23 MED ORDER — SODIUM CHLORIDE 0.9 % IV SOLN: INTRAVENOUS | Status: DC

## 2024-02-23 MED ORDER — PROPOFOL 10 MG/ML IV BOLUS
INTRAVENOUS | Status: DC | PRN
  Administered 2024-02-23: 50 mg via INTRAVENOUS

## 2024-02-23 NOTE — Op Note (Signed)
 [facility] Gastroenterology Patient Name: Cynthia Mccarthy Procedure Date: 02/23/2024 9:27 AM MRN: [id number] Account #: [id number] Date of Birth: 10/07/1970 Admit Type: Outpatient Age: 53 Room: [facility] ENDO ROOM 3 Gender: Female Note Status: Finalized Instrument Name: Peds Colonoscope [id number] Procedure:             Colonoscopy Indications:           Surveillance: Personal history of adenomatous polyps                         on last colonoscopy > 5 years ago Providers:             Ole Schick MD, MD Medicines:             Monitored Anesthesia Care Complications:         No immediate complications. Estimated blood loss:                         Minimal. Procedure:             Pre-Anesthesia Assessment:                        - Prior to the procedure, a History and Physical was                         performed, and patient medications and allergies were                         reviewed. The patient is competent. The risks and                         benefits of the procedure and the sedation options and                         risks were discussed with the patient. All questions                         were answered and informed consent was obtained.                         Patient identification and proposed procedure were                         verified by the physician, the nurse, the                         anesthesiologist, the anesthetist and the technician                         in the endoscopy suite. Mental Status Examination:                         alert and oriented. Airway Examination: normal                         oropharyngeal airway and neck mobility. Respiratory                         Examination: clear to auscultation. CV Examination:  normal. Prophylactic Antibiotics: The patient does not                         require prophylactic antibiotics. Prior                         Anticoagulants: The patient has taken no  anticoagulant                         or antiplatelet agents. ASA Grade Assessment: III - A                         patient with severe systemic disease. After reviewing                         the risks and benefits, the patient was deemed in                         satisfactory condition to undergo the procedure. The                         anesthesia plan was to use monitored anesthesia care                         (MAC). Immediately prior to administration of                         medications, the patient was re-assessed for adequacy                         to receive sedatives. The heart rate, respiratory                         rate, oxygen saturations, blood pressure, adequacy of                         pulmonary ventilation, and response to care were                         monitored throughout the procedure. The physical                         status of the patient was re-assessed after the                         procedure.                        After obtaining informed consent, the colonoscope was                         passed under direct vision. Throughout the procedure,                         the patient's blood pressure, pulse, and oxygen                         saturations were monitored continuously. The  Colonoscope was introduced through the anus and                         advanced to the the cecum, identified by appendiceal                         orifice and ileocecal valve. The colonoscopy was                         technically difficult and complex due to bowel                         stenosis. The patient tolerated the procedure well.                         The quality of the bowel preparation was good. The                         ileocecal valve, appendiceal orifice, and rectum were                         photographed. Findings:      The perianal and digital rectal examinations were normal.      A diffuse area of mildly nodular  mucosa was found in the cecum. Biopsies       were taken with a cold forceps for histology. Estimated blood loss was       minimal.      A 3 mm polyp was found in the transverse colon. The polyp was sessile.       The polyp was removed with a cold snare. Resection and retrieval were       complete. Estimated blood loss was minimal.      A 2 mm polyp was found in the descending colon. The polyp was sessile.       The polyp was removed with a jumbo cold forceps. Resection and retrieval       were complete. Estimated blood loss was minimal.      A benign-appearing, intrinsic moderate stenosis was found in the sigmoid       colon and was traversed.      Internal hemorrhoids were found during endoscopy. The hemorrhoids were       Grade I (internal hemorrhoids that do not prolapse).      Retroflexion in the rectum was not performed due to small rectal vault.      The exam was otherwise without abnormality. Impression:            - Nodular mucosa in the cecum. Biopsied.                        - One 3 mm polyp in the transverse colon, removed with                         a cold snare. Resected and retrieved.                        - One 2 mm polyp in the descending colon, removed with  a jumbo cold forceps. Resected and retrieved.                        - Stricture in the sigmoid colon.                        - Internal hemorrhoids.                        - The examination was otherwise normal. Recommendation:        - Discharge patient to home.                        - Resume previous diet.                        - Continue present medications.                        - Await pathology results.                        - Repeat colonoscopy in 5 years for surveillance.                        - Return to referring physician as previously                         scheduled. Procedure Code(s):     --- Professional ---                        [phone], Colonoscopy, flexible; with  removal of                         tumor(s), polyp(s), or other lesion(s) by snare                         technique                        45380, 59, Colonoscopy, flexible; with biopsy, single                         or multiple Diagnosis Code(s):     --- Professional ---                        Z86.010, Personal history of colonic polyps                        K63.89, Other specified diseases of intestine                        D12.3, Benign neoplasm of transverse colon (hepatic                         flexure or splenic flexure)                        D12.4, Benign neoplasm of descending colon                        K56.699,  Other intestinal obstruction unspecified as                         to partial versus complete obstruction                        K64.0, First degree hemorrhoids CPT copyright 2022 American Medical Association. All rights reserved. The codes documented in this report are preliminary and upon coder review may  be revised to meet current compliance requirements. Ole Schick MD, MD 02/23/2024 10:26:44 AM Number of Addenda: 0 Note Initiated On: 02/23/2024 9:27 AM Scope Withdrawal Time: 0 hours 11 minutes 51 seconds  Total Procedure Duration: 0 hours 25 minutes 6 seconds  Estimated Blood Loss:  Estimated blood loss was minimal.      [facility]

## 2024-02-23 NOTE — Op Note (Signed)
 [facility] Gastroenterology Patient Name: Cynthia Mccarthy Procedure Date: 02/23/2024 9:28 AM MRN: [id number] Account #: [id number] Date of Birth: 12/31/1970 Admit Type: Outpatient Age: 53 Room: [facility] ENDO ROOM 3 Gender: Female Note Status: Finalized Instrument Name: Barnie GI Scope [id number] Procedure:             Upper GI endoscopy Indications:           Nausea Providers:             Ole Schick MD, MD Medicines:             Monitored Anesthesia Care Complications:         No immediate complications. Estimated blood loss:                         Minimal. Procedure:             Pre-Anesthesia Assessment:                        - Prior to the procedure, a History and Physical was                         performed, and patient medications and allergies were                         reviewed. The patient is competent. The risks and                         benefits of the procedure and the sedation options and                         risks were discussed with the patient. All questions                         were answered and informed consent was obtained.                         Patient identification and proposed procedure were                         verified by the physician, the nurse, the                         anesthesiologist, the anesthetist and the technician                         in the endoscopy suite. Mental Status Examination:                         alert and oriented. Airway Examination: normal                         oropharyngeal airway and neck mobility. Respiratory                         Examination: clear to auscultation. CV Examination:                         normal. Prophylactic Antibiotics: The patient does not  require prophylactic antibiotics. Prior                         Anticoagulants: The patient has taken no anticoagulant                         or antiplatelet agents. ASA Grade Assessment: III - A                          patient with severe systemic disease. After reviewing                         the risks and benefits, the patient was deemed in                         satisfactory condition to undergo the procedure. The                         anesthesia plan was to use monitored anesthesia care                         (MAC). Immediately prior to administration of                         medications, the patient was re-assessed for adequacy                         to receive sedatives. The heart rate, respiratory                         rate, oxygen saturations, blood pressure, adequacy of                         pulmonary ventilation, and response to care were                         monitored throughout the procedure. The physical                         status of the patient was re-assessed after the                         procedure.                        After obtaining informed consent, the endoscope was                         passed under direct vision. Throughout the procedure,                         the patient's blood pressure, pulse, and oxygen                         saturations were monitored continuously. The Endoscope                         was introduced through the mouth, and advanced to the  second part of duodenum. The upper GI endoscopy was                         accomplished without difficulty. The patient tolerated                         the procedure well. Findings:      The examined esophagus was normal.      Patchy mild inflammation characterized by erythema was found in the       gastric antrum. Biopsies were taken with a cold forceps for Helicobacter       pylori testing. Estimated blood loss was minimal.      The examined duodenum was normal. Impression:            - Normal esophagus.                        - Gastritis. Biopsied.                        - Normal examined duodenum. Recommendation:        - Discharge patient to home.                         - Resume previous diet.                        - Continue present medications.                        - Await pathology results.                        - Return to referring physician as previously                         scheduled. Procedure Code(s):     --- Professional ---                        [phone], Esophagogastroduodenoscopy, flexible,                         transoral; with biopsy, single or multiple Diagnosis Code(s):     --- Professional ---                        K29.70, Gastritis, unspecified, without bleeding                        R11.0, Nausea CPT copyright 2022 American Medical Association. All rights reserved. The codes documented in this report are preliminary and upon coder review may  be revised to meet current compliance requirements. Ole Schick MD, MD 02/23/2024 10:21:50 AM Number of Addenda: 0 Note Initiated On: 02/23/2024 9:28 AM Estimated Blood Loss:  Estimated blood loss was minimal.      [facility]

## 2024-02-23 NOTE — Anesthesia Procedure Notes (Signed)
 Date/Time: 02/23/2024 9:37 AM  Performed by: Duwayne Craven, CRNAPre-anesthesia Checklist: Patient identified, Emergency Drugs available, Suction available, Patient being monitored and Timeout performed Patient Re-evaluated:Patient Re-evaluated prior to induction Oxygen Delivery Method: Nasal cannula Induction Type: IV induction Placement Confirmation: CO2 detector and positive ETCO2

## 2024-02-23 NOTE — Transfer of Care (Signed)
 Immediate Anesthesia Transfer of Care Note  Patient: Cynthia Mccarthy  Procedure(s) Performed: COLONOSCOPY EGD (ESOPHAGOGASTRODUODENOSCOPY) POLYPECTOMY, INTESTINE  Patient Location: PACU  Anesthesia Type:General  Level of Consciousness: drowsy  Airway & Oxygen Therapy: Patient Spontanous Breathing  Post-op Assessment: Report given to RN and Post -op Vital signs reviewed and stable  Post vital signs: Reviewed and stable  Last Vitals:  Vitals Value Taken Time  BP    Temp    Pulse    Resp    SpO2      Last Pain:  Vitals:   02/23/24 0857  TempSrc: Temporal  PainSc: 0-No pain         Complications: No notable events documented.

## 2024-02-23 NOTE — Anesthesia Preprocedure Evaluation (Signed)
 Anesthesia Evaluation  Patient identified by MRN, date of birth, ID band Patient awake    Reviewed: Allergy & Precautions, H&P , NPO status , Patient's Chart, lab work & pertinent test results, reviewed documented beta blocker date and time   Airway Mallampati: II   Neck ROM: full    Dental  (+) Poor Dentition   Pulmonary neg pulmonary ROS   Pulmonary exam normal        Cardiovascular Exercise Tolerance: Good hypertension, On Medications negative cardio ROS Normal cardiovascular exam Rhythm:regular Rate:Normal     Neuro/Psych   Anxiety Depression    negative neurological ROS  negative psych ROS   GI/Hepatic Neg liver ROS,GERD  Medicated,,  Endo/Other  diabetesHypothyroidism    Renal/GU Renal disease  negative genitourinary   Musculoskeletal   Abdominal   Peds  Hematology negative hematology ROS (+)   Anesthesia Other Findings Past Medical History: No date: ANA positive No date: Anxiety No date: Arthralgia No date: Cervical cancer (HCC) No date: Chronic kidney disease No date: Depression No date: Diabetes mellitus without complication (HCC) No date: DVT (deep venous thrombosis) (HCC) No date: Exertional dyspnea No date: Hormone disorder No date: Hypertension 11/13/2022: Hypokalemia 11/14/2022: Hypomagnesemia No date: Hypothyroidism No date: 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 No date: Vitamin D deficiency Past Surgical History: No date: ABDOMINAL HYSTERECTOMY No date: CREATION ILEAL CONDUIT No date: CYSTECTOMY, PARTIAL, ROBOT-ASSISTED, LAPAROSCOPIC No date: CYSTOURETHROSCOPY No date: GLOSSECTOMY; N/A 11/13/2022: IVC FILTER INSERTION; N/A     Comment:  Procedure: IVC FILTER INSERTION;  Surgeon: Marea Selinda RAMAN,              MD;  Location: [facility];  Service:               Cardiovascular;  Laterality: N/A; 01/30/2023: IVC FILTER REMOVAL; N/A     Comment:  Procedure:  IVC FILTER REMOVAL;  Surgeon: Marea Selinda RAMAN,               MD;  Location: [facility];  Service:               Cardiovascular;  Laterality: N/A; No date: MANDIBLE SURGERY 11/15/2022: PERIPHERAL VASCULAR THROMBECTOMY; Left     Comment:  Procedure: PERIPHERAL VASCULAR THROMBECTOMY;  Surgeon:               Marea Selinda RAMAN, MD;  Location: [facility];                Service: Cardiovascular;  Laterality: Left; 11/13/2022: PULMONARY THROMBECTOMY; Bilateral     Comment:  Procedure: PULMONARY THROMBECTOMY;  Surgeon: Marea Selinda RAMAN, MD;  Location: [facility];  Service:               Cardiovascular;  Laterality: Bilateral; No date: WISDOM TOOTH EXTRACTION BMI    Body Mass Index: 31.53 kg/m     Reproductive/Obstetrics negative OB ROS                              Anesthesia Physical Anesthesia Plan  ASA: 3  Anesthesia Plan: General   Post-op Pain Management:    Induction:   PONV Risk Score and Plan:   Airway Management Planned:   Additional Equipment:   Intra-op Plan:   Post-operative Plan:   Informed Consent: I have reviewed the patients History  and Physical, chart, labs and discussed the procedure including the risks, benefits and alternatives for the proposed anesthesia with the patient or authorized representative who has indicated his/her understanding and acceptance.     Dental Advisory Given  Plan Discussed with: CRNA  Anesthesia Plan Comments:         Anesthesia Quick Evaluation

## 2024-02-23 NOTE — H&P (Signed)
 Outpatient short stay form Pre-procedure 02/23/2024  Cynthia [name], MD  Primary Physician: Don Lauraine Collar, NP  Reason for visit:  Nausea/Surveillance  History of present illness:    53 y/o lady with history of hypothyroidism, urinary diversion, hysterectomy for cervical cancer, and history of blood clots on eliquis  with last dose 4 days ago here for EGD for nausea and colonoscopy for history of small TA on last colonoscopy in 2019.    Current Facility-Administered Medications:    0.9 %  sodium chloride  infusion, , Intravenous, Continuous, Sherrick Araki, Cynthia ONEIDA, MD, Last Rate: 20 mL/hr at 02/23/24 9072, Continued from Pre-op at 02/23/24 0927  Medications Prior to Admission  Medication Sig Dispense Refill Last Dose/Taking   busPIRone  (BUSPAR ) 10 MG tablet Take 10 mg by mouth 2 (two) times daily.   02/22/2024   citalopram  (CELEXA ) 40 MG tablet Take 40 mg by mouth daily.   02/22/2024   ELIQUIS  5 MG TABS tablet TAKE 1 TABLET BY MOUTH TWICE A DAY 60 tablet 5 02/19/2024   levothyroxine  (SYNTHROID ) 100 MCG tablet Take 100 mcg by mouth daily before breakfast.   02/22/2024   metFORMIN (GLUCOPHAGE) 500 MG tablet Take 500 mg by mouth daily.   Past Week   rosuvastatin  (CRESTOR ) 5 MG tablet Take 5 mg by mouth daily.   02/22/2024   traZODone  (DESYREL ) 100 MG tablet Take 100 mg by mouth at bedtime. Take 100 mg by mouth nightly Take 2 tabs if needed   Past Week   vitamin B-12 (CYANOCOBALAMIN) 1000 MCG tablet Take by mouth.   Past Week   cariprazine (VRAYLAR) 1.5 MG capsule Take 1.5 mg by mouth daily. (Patient not taking: Reported on 12/04/2023)      Catheters (INTERMITTENT 14FR/40CM) MISC Use for urinary self-catheterization 3 times a day as directed (Patient not taking: Reported on 12/04/2023)      famotidine  (PEPCID ) 40 MG tablet Take 40 mg by mouth daily.      SYMBICORT 160-4.5 MCG/ACT inhaler Inhale into the lungs.        Allergies  Allergen Reactions   Penicillins Rash    Has patient had a  PCN reaction causing immediate rash, facial/tongue/throat swelling, SOB or lightheadedness with hypotension: Yes Has patient had a PCN reaction causing severe rash involving mucus membranes or skin necrosis: No Has patient had a PCN reaction that required hospitalization: No Has patient had a PCN reaction occurring within the last 10 years: Yes If all of the above answers are NO, then may proceed with Cephalosporin use.   Melatonin     Other reaction(s): Other (See Comments), Other (See Comments) Pt. States have nightmares Pt. 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    Physical Exam  Gen: Alert, oriented. Appears stated age.  HEENT: PERRLA. Lungs: No respiratory distress CV: RRR Abd: soft, benign, no masses Ext: No edema  Planned procedures: Proceed with EGD/colonoscopy. The patient understands the nature of the planned procedure, indications, risks, alternatives and potential complications including but not limited to bleeding, infection, perforation, damage to internal organs and possible oversedation/side effects from anesthesia. The patient agrees and gives consent to proceed.  Please refer to procedure notes for findings, recommendations and patient disposition/instructions.     Cynthia [name], MD [facility]

## 2024-02-23 NOTE — Interval H&P Note (Signed)
 History and Physical Interval Note:  02/23/2024 9:36 AM  Cynthia Mccarthy  has presented today for surgery, with the diagnosis of GERD h/o colon polyps.  The various methods of treatment have been discussed with the patient and family. After consideration of risks, benefits and other options for treatment, the patient has consented to  Procedure(s): COLONOSCOPY (N/A) EGD (ESOPHAGOGASTRODUODENOSCOPY) (N/A) as a surgical intervention.  The patient's history has been reviewed, patient examined, no change in status, stable for surgery.  I have reviewed the patient's chart and labs.  Questions were answered to the patient's satisfaction.     Ole [name]  Ok to proceed with EGD/Colonoscopy

## 2024-02-25 LAB — SURGICAL PATHOLOGY

## 2024-03-01 NOTE — Anesthesia Postprocedure Evaluation (Signed)
 Anesthesia Post Note  Patient: Cynthia Mccarthy  Procedure(s) Performed: COLONOSCOPY EGD (ESOPHAGOGASTRODUODENOSCOPY) POLYPECTOMY, INTESTINE  Patient location during evaluation: PACU Anesthesia Type: General Level of consciousness: awake and alert Pain management: pain level controlled Vital Signs Assessment: post-procedure vital signs reviewed and stable Respiratory status: spontaneous breathing, nonlabored ventilation, respiratory function stable and patient connected to nasal cannula oxygen Cardiovascular status: blood pressure returned to baseline and stable Postop Assessment: no apparent nausea or vomiting Anesthetic complications: no   No notable events documented.   Last Vitals:  Vitals:   02/23/24 1026 02/23/24 1035  BP: (!) 117/91 106/79  Pulse: 64 65  Resp: 16 16  Temp:    SpO2: 98% 100%    Last Pain:  Vitals:   02/23/24 1035  TempSrc:   PainSc: 0-No pain                 Lynwood KANDICE Clause

## 2024-03-22 ENCOUNTER — Telehealth (INDEPENDENT_AMBULATORY_CARE_PROVIDER_SITE_OTHER): Payer: Self-pay

## 2024-03-22 ENCOUNTER — Other Ambulatory Visit (INDEPENDENT_AMBULATORY_CARE_PROVIDER_SITE_OTHER): Payer: Self-pay | Admitting: Nurse Practitioner

## 2024-03-22 DIAGNOSIS — M25562 Pain in left knee: Secondary | ICD-10-CM

## 2024-03-22 DIAGNOSIS — M7989 Other specified soft tissue disorders: Secondary | ICD-10-CM

## 2024-03-22 NOTE — Telephone Encounter (Signed)
 Return for DVT study but if she is continued on her Eliquis  I doubt that it is a DVT.  She does have some known knee issues so it could actually be the knee that is causing her pain and discomfort.

## 2024-03-22 NOTE — Telephone Encounter (Signed)
 Patient left a message stating that on last Thursday she started having stabbing pain above the left knee. The pain now feels more faint and there is some calf swelling. When the patient has no pain the area feels tingling and as she pressed the area it feels tender. Please Advise

## 2024-03-23 ENCOUNTER — Other Ambulatory Visit (INDEPENDENT_AMBULATORY_CARE_PROVIDER_SITE_OTHER)

## 2024-03-23 DIAGNOSIS — M25562 Pain in left knee: Secondary | ICD-10-CM

## 2024-03-23 DIAGNOSIS — M7989 Other specified soft tissue disorders: Secondary | ICD-10-CM | POA: Diagnosis not present

## 2024-06-11 ENCOUNTER — Telehealth (INDEPENDENT_AMBULATORY_CARE_PROVIDER_SITE_OTHER): Payer: Self-pay

## 2024-06-11 NOTE — Telephone Encounter (Signed)
 Patient called into nurse line wanting to request ok for surgery, advised her to have the office that would be doing her surgery to fax us  her clearance paperwork and we can have someone look this over for her. Patient in agreement. No further questions at this time.

## 2024-06-17 NOTE — Telephone Encounter (Signed)
 Received consult form from [facility] surgery without Preop clearance included, called asked that it be sent over for the patient to be filled out.

## 2024-12-03 ENCOUNTER — Ambulatory Visit (INDEPENDENT_AMBULATORY_CARE_PROVIDER_SITE_OTHER): Admitting: Vascular Surgery
